# Patient Record
Sex: Female | Born: 1935 | Race: White | Hispanic: No | Marital: Single | Smoking: Never smoker
Health system: Southern US, Community
[De-identification: ages and names within clinical notes are randomized; demographics above are authoritative.]

## PROBLEM LIST (undated history)

## (undated) DIAGNOSIS — J479 Bronchiectasis, uncomplicated: Secondary | ICD-10-CM

## (undated) DIAGNOSIS — I10 Essential (primary) hypertension: Secondary | ICD-10-CM

## (undated) DIAGNOSIS — I712 Thoracic aortic aneurysm, without rupture: Secondary | ICD-10-CM

## (undated) DIAGNOSIS — R32 Unspecified urinary incontinence: Secondary | ICD-10-CM

## (undated) DIAGNOSIS — H409 Unspecified glaucoma: Secondary | ICD-10-CM

## (undated) DIAGNOSIS — I71019 Dissection of thoracic aorta, unspecified: Secondary | ICD-10-CM

## (undated) DIAGNOSIS — I219 Acute myocardial infarction, unspecified: Secondary | ICD-10-CM

## (undated) DIAGNOSIS — R4189 Other symptoms and signs involving cognitive functions and awareness: Secondary | ICD-10-CM

## (undated) HISTORY — DX: Acute myocardial infarction, unspecified: I21.9

## (undated) HISTORY — DX: Essential (primary) hypertension: I10

## (undated) HISTORY — DX: Unspecified urinary incontinence: R32

## (undated) HISTORY — DX: Other symptoms and signs involving cognitive functions and awareness: R41.89

## (undated) HISTORY — DX: Unspecified glaucoma: H40.9

## (undated) HISTORY — DX: Bronchiectasis, uncomplicated: J47.9

---

## 1992-04-30 HISTORY — PX: CATARACT EXTRACTION: SUR2

## 2004-04-30 HISTORY — PX: CATARACT EXTRACTION: SUR2

## 2014-10-22 LAB — HM DEXA SCAN

## 2015-05-10 DIAGNOSIS — J40 Bronchitis, not specified as acute or chronic: Secondary | ICD-10-CM | POA: Diagnosis not present

## 2015-05-10 DIAGNOSIS — I10 Essential (primary) hypertension: Secondary | ICD-10-CM | POA: Diagnosis not present

## 2015-06-29 DIAGNOSIS — R918 Other nonspecific abnormal finding of lung field: Secondary | ICD-10-CM | POA: Diagnosis not present

## 2015-06-29 DIAGNOSIS — J9811 Atelectasis: Secondary | ICD-10-CM | POA: Diagnosis not present

## 2015-06-29 DIAGNOSIS — R05 Cough: Secondary | ICD-10-CM | POA: Diagnosis not present

## 2015-06-29 DIAGNOSIS — J40 Bronchitis, not specified as acute or chronic: Secondary | ICD-10-CM | POA: Diagnosis not present

## 2015-06-29 DIAGNOSIS — I10 Essential (primary) hypertension: Secondary | ICD-10-CM | POA: Diagnosis not present

## 2015-06-30 DIAGNOSIS — I10 Essential (primary) hypertension: Secondary | ICD-10-CM | POA: Diagnosis not present

## 2015-06-30 DIAGNOSIS — E782 Mixed hyperlipidemia: Secondary | ICD-10-CM | POA: Diagnosis not present

## 2015-06-30 DIAGNOSIS — I2510 Atherosclerotic heart disease of native coronary artery without angina pectoris: Secondary | ICD-10-CM | POA: Diagnosis not present

## 2015-08-04 DIAGNOSIS — H401132 Primary open-angle glaucoma, bilateral, moderate stage: Secondary | ICD-10-CM | POA: Diagnosis not present

## 2015-08-04 DIAGNOSIS — H43813 Vitreous degeneration, bilateral: Secondary | ICD-10-CM | POA: Diagnosis not present

## 2015-08-24 DIAGNOSIS — R05 Cough: Secondary | ICD-10-CM | POA: Diagnosis not present

## 2015-08-24 DIAGNOSIS — J471 Bronchiectasis with (acute) exacerbation: Secondary | ICD-10-CM | POA: Diagnosis not present

## 2015-09-19 DIAGNOSIS — R05 Cough: Secondary | ICD-10-CM | POA: Diagnosis not present

## 2015-09-19 DIAGNOSIS — J479 Bronchiectasis, uncomplicated: Secondary | ICD-10-CM | POA: Diagnosis not present

## 2015-09-19 DIAGNOSIS — R918 Other nonspecific abnormal finding of lung field: Secondary | ICD-10-CM | POA: Diagnosis not present

## 2015-09-29 DIAGNOSIS — R05 Cough: Secondary | ICD-10-CM | POA: Diagnosis not present

## 2015-09-29 DIAGNOSIS — J42 Unspecified chronic bronchitis: Secondary | ICD-10-CM | POA: Diagnosis not present

## 2015-09-29 DIAGNOSIS — R1313 Dysphagia, pharyngeal phase: Secondary | ICD-10-CM | POA: Diagnosis not present

## 2015-10-05 DIAGNOSIS — J42 Unspecified chronic bronchitis: Secondary | ICD-10-CM | POA: Diagnosis not present

## 2015-10-05 DIAGNOSIS — R1310 Dysphagia, unspecified: Secondary | ICD-10-CM | POA: Diagnosis not present

## 2015-10-05 DIAGNOSIS — R1312 Dysphagia, oropharyngeal phase: Secondary | ICD-10-CM | POA: Diagnosis not present

## 2015-10-05 DIAGNOSIS — R1313 Dysphagia, pharyngeal phase: Secondary | ICD-10-CM | POA: Diagnosis not present

## 2015-10-05 DIAGNOSIS — R05 Cough: Secondary | ICD-10-CM | POA: Diagnosis not present

## 2015-10-06 DIAGNOSIS — Z0000 Encounter for general adult medical examination without abnormal findings: Secondary | ICD-10-CM | POA: Diagnosis not present

## 2015-10-06 DIAGNOSIS — R413 Other amnesia: Secondary | ICD-10-CM | POA: Diagnosis not present

## 2015-10-06 DIAGNOSIS — M25562 Pain in left knee: Secondary | ICD-10-CM | POA: Diagnosis not present

## 2015-10-06 DIAGNOSIS — I10 Essential (primary) hypertension: Secondary | ICD-10-CM | POA: Diagnosis not present

## 2015-10-07 DIAGNOSIS — R05 Cough: Secondary | ICD-10-CM | POA: Diagnosis not present

## 2015-10-07 DIAGNOSIS — R1313 Dysphagia, pharyngeal phase: Secondary | ICD-10-CM | POA: Diagnosis not present

## 2015-10-07 DIAGNOSIS — J42 Unspecified chronic bronchitis: Secondary | ICD-10-CM | POA: Diagnosis not present

## 2015-10-11 DIAGNOSIS — R05 Cough: Secondary | ICD-10-CM | POA: Diagnosis not present

## 2015-10-11 DIAGNOSIS — R1313 Dysphagia, pharyngeal phase: Secondary | ICD-10-CM | POA: Diagnosis not present

## 2015-10-11 DIAGNOSIS — J42 Unspecified chronic bronchitis: Secondary | ICD-10-CM | POA: Diagnosis not present

## 2015-10-13 DIAGNOSIS — G8929 Other chronic pain: Secondary | ICD-10-CM | POA: Diagnosis not present

## 2015-10-13 DIAGNOSIS — I10 Essential (primary) hypertension: Secondary | ICD-10-CM | POA: Diagnosis not present

## 2015-10-13 DIAGNOSIS — R072 Precordial pain: Secondary | ICD-10-CM | POA: Diagnosis not present

## 2015-10-13 DIAGNOSIS — M25562 Pain in left knee: Secondary | ICD-10-CM | POA: Diagnosis not present

## 2015-10-14 DIAGNOSIS — R1313 Dysphagia, pharyngeal phase: Secondary | ICD-10-CM | POA: Diagnosis not present

## 2015-10-14 DIAGNOSIS — R05 Cough: Secondary | ICD-10-CM | POA: Diagnosis not present

## 2015-10-14 DIAGNOSIS — J42 Unspecified chronic bronchitis: Secondary | ICD-10-CM | POA: Diagnosis not present

## 2015-10-17 DIAGNOSIS — R05 Cough: Secondary | ICD-10-CM | POA: Diagnosis not present

## 2015-10-17 DIAGNOSIS — J42 Unspecified chronic bronchitis: Secondary | ICD-10-CM | POA: Diagnosis not present

## 2015-10-17 DIAGNOSIS — R1313 Dysphagia, pharyngeal phase: Secondary | ICD-10-CM | POA: Diagnosis not present

## 2015-10-18 DIAGNOSIS — M7120 Synovial cyst of popliteal space [Baker], unspecified knee: Secondary | ICD-10-CM | POA: Diagnosis not present

## 2015-10-18 DIAGNOSIS — M25562 Pain in left knee: Secondary | ICD-10-CM | POA: Diagnosis not present

## 2015-10-18 DIAGNOSIS — G8929 Other chronic pain: Secondary | ICD-10-CM | POA: Diagnosis not present

## 2015-10-18 DIAGNOSIS — M76892 Other specified enthesopathies of left lower limb, excluding foot: Secondary | ICD-10-CM | POA: Diagnosis not present

## 2015-10-18 DIAGNOSIS — M7122 Synovial cyst of popliteal space [Baker], left knee: Secondary | ICD-10-CM | POA: Diagnosis not present

## 2015-10-19 DIAGNOSIS — R1313 Dysphagia, pharyngeal phase: Secondary | ICD-10-CM | POA: Diagnosis not present

## 2015-10-19 DIAGNOSIS — J42 Unspecified chronic bronchitis: Secondary | ICD-10-CM | POA: Diagnosis not present

## 2015-10-19 DIAGNOSIS — R05 Cough: Secondary | ICD-10-CM | POA: Diagnosis not present

## 2015-10-24 DIAGNOSIS — J42 Unspecified chronic bronchitis: Secondary | ICD-10-CM | POA: Diagnosis not present

## 2015-10-24 DIAGNOSIS — R05 Cough: Secondary | ICD-10-CM | POA: Diagnosis not present

## 2015-10-24 DIAGNOSIS — R1313 Dysphagia, pharyngeal phase: Secondary | ICD-10-CM | POA: Diagnosis not present

## 2015-10-26 DIAGNOSIS — J42 Unspecified chronic bronchitis: Secondary | ICD-10-CM | POA: Diagnosis not present

## 2015-10-26 DIAGNOSIS — R1313 Dysphagia, pharyngeal phase: Secondary | ICD-10-CM | POA: Diagnosis not present

## 2015-10-26 DIAGNOSIS — R05 Cough: Secondary | ICD-10-CM | POA: Diagnosis not present

## 2015-11-07 DIAGNOSIS — M1712 Unilateral primary osteoarthritis, left knee: Secondary | ICD-10-CM | POA: Diagnosis not present

## 2015-12-07 DIAGNOSIS — B399 Histoplasmosis, unspecified: Secondary | ICD-10-CM | POA: Diagnosis not present

## 2015-12-07 DIAGNOSIS — H43813 Vitreous degeneration, bilateral: Secondary | ICD-10-CM | POA: Diagnosis not present

## 2015-12-07 DIAGNOSIS — H401132 Primary open-angle glaucoma, bilateral, moderate stage: Secondary | ICD-10-CM | POA: Diagnosis not present

## 2015-12-07 DIAGNOSIS — H04123 Dry eye syndrome of bilateral lacrimal glands: Secondary | ICD-10-CM | POA: Diagnosis not present

## 2015-12-26 DIAGNOSIS — E782 Mixed hyperlipidemia: Secondary | ICD-10-CM | POA: Diagnosis not present

## 2015-12-26 DIAGNOSIS — R590 Localized enlarged lymph nodes: Secondary | ICD-10-CM | POA: Diagnosis not present

## 2015-12-26 DIAGNOSIS — J40 Bronchitis, not specified as acute or chronic: Secondary | ICD-10-CM | POA: Diagnosis not present

## 2015-12-26 DIAGNOSIS — I2510 Atherosclerotic heart disease of native coronary artery without angina pectoris: Secondary | ICD-10-CM | POA: Diagnosis not present

## 2015-12-26 DIAGNOSIS — I10 Essential (primary) hypertension: Secondary | ICD-10-CM | POA: Diagnosis not present

## 2015-12-26 DIAGNOSIS — R05 Cough: Secondary | ICD-10-CM | POA: Diagnosis not present

## 2015-12-26 DIAGNOSIS — J479 Bronchiectasis, uncomplicated: Secondary | ICD-10-CM | POA: Diagnosis not present

## 2016-01-03 DIAGNOSIS — N281 Cyst of kidney, acquired: Secondary | ICD-10-CM | POA: Diagnosis not present

## 2016-01-03 DIAGNOSIS — R590 Localized enlarged lymph nodes: Secondary | ICD-10-CM | POA: Diagnosis not present

## 2016-01-03 DIAGNOSIS — L723 Sebaceous cyst: Secondary | ICD-10-CM | POA: Diagnosis not present

## 2016-01-03 DIAGNOSIS — M8580 Other specified disorders of bone density and structure, unspecified site: Secondary | ICD-10-CM | POA: Diagnosis not present

## 2016-01-03 DIAGNOSIS — J479 Bronchiectasis, uncomplicated: Secondary | ICD-10-CM | POA: Diagnosis not present

## 2016-01-03 DIAGNOSIS — M5134 Other intervertebral disc degeneration, thoracic region: Secondary | ICD-10-CM | POA: Diagnosis not present

## 2016-01-03 DIAGNOSIS — E041 Nontoxic single thyroid nodule: Secondary | ICD-10-CM | POA: Diagnosis not present

## 2016-01-03 DIAGNOSIS — I517 Cardiomegaly: Secondary | ICD-10-CM | POA: Diagnosis not present

## 2016-01-03 DIAGNOSIS — N289 Disorder of kidney and ureter, unspecified: Secondary | ICD-10-CM | POA: Diagnosis not present

## 2016-01-03 DIAGNOSIS — R05 Cough: Secondary | ICD-10-CM | POA: Diagnosis not present

## 2016-01-03 DIAGNOSIS — R918 Other nonspecific abnormal finding of lung field: Secondary | ICD-10-CM | POA: Diagnosis not present

## 2016-01-03 DIAGNOSIS — I712 Thoracic aortic aneurysm, without rupture: Secondary | ICD-10-CM | POA: Diagnosis not present

## 2016-01-04 DIAGNOSIS — R05 Cough: Secondary | ICD-10-CM | POA: Diagnosis not present

## 2016-01-04 DIAGNOSIS — J479 Bronchiectasis, uncomplicated: Secondary | ICD-10-CM | POA: Diagnosis not present

## 2016-01-17 DIAGNOSIS — R413 Other amnesia: Secondary | ICD-10-CM | POA: Diagnosis not present

## 2016-01-17 DIAGNOSIS — I10 Essential (primary) hypertension: Secondary | ICD-10-CM | POA: Diagnosis not present

## 2016-01-17 DIAGNOSIS — J479 Bronchiectasis, uncomplicated: Secondary | ICD-10-CM | POA: Diagnosis not present

## 2016-02-01 DIAGNOSIS — Z7982 Long term (current) use of aspirin: Secondary | ICD-10-CM | POA: Diagnosis not present

## 2016-02-01 DIAGNOSIS — Z23 Encounter for immunization: Secondary | ICD-10-CM | POA: Diagnosis not present

## 2016-02-01 DIAGNOSIS — K5641 Fecal impaction: Secondary | ICD-10-CM | POA: Diagnosis not present

## 2016-02-01 DIAGNOSIS — I10 Essential (primary) hypertension: Secondary | ICD-10-CM | POA: Diagnosis not present

## 2016-02-01 DIAGNOSIS — K5900 Constipation, unspecified: Secondary | ICD-10-CM | POA: Diagnosis not present

## 2016-02-01 DIAGNOSIS — K6289 Other specified diseases of anus and rectum: Secondary | ICD-10-CM | POA: Diagnosis not present

## 2016-02-02 DIAGNOSIS — I10 Essential (primary) hypertension: Secondary | ICD-10-CM | POA: Diagnosis not present

## 2016-02-02 DIAGNOSIS — Z23 Encounter for immunization: Secondary | ICD-10-CM | POA: Diagnosis not present

## 2016-02-02 DIAGNOSIS — K5641 Fecal impaction: Secondary | ICD-10-CM | POA: Diagnosis not present

## 2016-02-06 DIAGNOSIS — M2012 Hallux valgus (acquired), left foot: Secondary | ICD-10-CM | POA: Diagnosis not present

## 2016-03-27 DIAGNOSIS — K5904 Chronic idiopathic constipation: Secondary | ICD-10-CM | POA: Diagnosis not present

## 2016-03-27 DIAGNOSIS — Z86010 Personal history of colonic polyps: Secondary | ICD-10-CM | POA: Diagnosis not present

## 2016-04-18 DIAGNOSIS — H43813 Vitreous degeneration, bilateral: Secondary | ICD-10-CM | POA: Diagnosis not present

## 2016-04-18 DIAGNOSIS — H401132 Primary open-angle glaucoma, bilateral, moderate stage: Secondary | ICD-10-CM | POA: Diagnosis not present

## 2016-04-18 DIAGNOSIS — H04123 Dry eye syndrome of bilateral lacrimal glands: Secondary | ICD-10-CM | POA: Diagnosis not present

## 2016-04-18 DIAGNOSIS — B399 Histoplasmosis, unspecified: Secondary | ICD-10-CM | POA: Diagnosis not present

## 2016-05-18 DIAGNOSIS — M25562 Pain in left knee: Secondary | ICD-10-CM | POA: Diagnosis not present

## 2016-05-18 DIAGNOSIS — G8929 Other chronic pain: Secondary | ICD-10-CM | POA: Diagnosis not present

## 2016-05-18 DIAGNOSIS — J479 Bronchiectasis, uncomplicated: Secondary | ICD-10-CM | POA: Diagnosis not present

## 2016-05-18 DIAGNOSIS — I10 Essential (primary) hypertension: Secondary | ICD-10-CM | POA: Diagnosis not present

## 2016-08-01 DIAGNOSIS — N390 Urinary tract infection, site not specified: Secondary | ICD-10-CM | POA: Diagnosis not present

## 2016-08-01 DIAGNOSIS — I10 Essential (primary) hypertension: Secondary | ICD-10-CM | POA: Diagnosis not present

## 2016-08-01 DIAGNOSIS — I2510 Atherosclerotic heart disease of native coronary artery without angina pectoris: Secondary | ICD-10-CM | POA: Diagnosis not present

## 2016-08-01 DIAGNOSIS — J479 Bronchiectasis, uncomplicated: Secondary | ICD-10-CM | POA: Diagnosis not present

## 2016-08-01 DIAGNOSIS — Z7982 Long term (current) use of aspirin: Secondary | ICD-10-CM | POA: Diagnosis not present

## 2016-08-01 DIAGNOSIS — J8410 Pulmonary fibrosis, unspecified: Secondary | ICD-10-CM | POA: Diagnosis not present

## 2016-08-01 DIAGNOSIS — I252 Old myocardial infarction: Secondary | ICD-10-CM | POA: Diagnosis not present

## 2016-08-01 DIAGNOSIS — R531 Weakness: Secondary | ICD-10-CM | POA: Diagnosis not present

## 2016-08-01 DIAGNOSIS — R410 Disorientation, unspecified: Secondary | ICD-10-CM | POA: Diagnosis not present

## 2016-08-16 DIAGNOSIS — R42 Dizziness and giddiness: Secondary | ICD-10-CM | POA: Diagnosis not present

## 2016-08-16 DIAGNOSIS — I252 Old myocardial infarction: Secondary | ICD-10-CM | POA: Diagnosis not present

## 2016-08-16 DIAGNOSIS — R05 Cough: Secondary | ICD-10-CM | POA: Diagnosis not present

## 2016-08-16 DIAGNOSIS — D649 Anemia, unspecified: Secondary | ICD-10-CM | POA: Diagnosis not present

## 2016-08-16 DIAGNOSIS — R918 Other nonspecific abnormal finding of lung field: Secondary | ICD-10-CM | POA: Diagnosis not present

## 2016-08-16 DIAGNOSIS — K5909 Other constipation: Secondary | ICD-10-CM | POA: Diagnosis not present

## 2016-08-16 DIAGNOSIS — L858 Other specified epidermal thickening: Secondary | ICD-10-CM | POA: Diagnosis not present

## 2016-08-16 DIAGNOSIS — J479 Bronchiectasis, uncomplicated: Secondary | ICD-10-CM | POA: Diagnosis not present

## 2016-08-16 DIAGNOSIS — I10 Essential (primary) hypertension: Secondary | ICD-10-CM | POA: Diagnosis not present

## 2016-08-16 DIAGNOSIS — R413 Other amnesia: Secondary | ICD-10-CM | POA: Diagnosis not present

## 2016-08-16 DIAGNOSIS — E785 Hyperlipidemia, unspecified: Secondary | ICD-10-CM | POA: Diagnosis not present

## 2016-08-16 DIAGNOSIS — I25118 Atherosclerotic heart disease of native coronary artery with other forms of angina pectoris: Secondary | ICD-10-CM | POA: Diagnosis not present

## 2016-08-23 DIAGNOSIS — K5909 Other constipation: Secondary | ICD-10-CM | POA: Diagnosis not present

## 2016-08-23 DIAGNOSIS — D508 Other iron deficiency anemias: Secondary | ICD-10-CM | POA: Diagnosis not present

## 2016-08-23 DIAGNOSIS — I25118 Atherosclerotic heart disease of native coronary artery with other forms of angina pectoris: Secondary | ICD-10-CM | POA: Diagnosis not present

## 2016-08-23 DIAGNOSIS — I10 Essential (primary) hypertension: Secondary | ICD-10-CM | POA: Diagnosis not present

## 2016-08-24 DIAGNOSIS — D508 Other iron deficiency anemias: Secondary | ICD-10-CM | POA: Diagnosis not present

## 2016-08-24 DIAGNOSIS — K5909 Other constipation: Secondary | ICD-10-CM | POA: Diagnosis not present

## 2016-08-27 DIAGNOSIS — R938 Abnormal findings on diagnostic imaging of other specified body structures: Secondary | ICD-10-CM | POA: Diagnosis not present

## 2016-08-27 DIAGNOSIS — R918 Other nonspecific abnormal finding of lung field: Secondary | ICD-10-CM | POA: Diagnosis not present

## 2016-08-27 DIAGNOSIS — I712 Thoracic aortic aneurysm, without rupture: Secondary | ICD-10-CM | POA: Diagnosis not present

## 2016-08-27 DIAGNOSIS — J479 Bronchiectasis, uncomplicated: Secondary | ICD-10-CM | POA: Diagnosis not present

## 2016-08-28 DIAGNOSIS — I712 Thoracic aortic aneurysm, without rupture, unspecified: Secondary | ICD-10-CM | POA: Insufficient documentation

## 2016-08-30 DIAGNOSIS — D485 Neoplasm of uncertain behavior of skin: Secondary | ICD-10-CM | POA: Diagnosis not present

## 2016-08-30 DIAGNOSIS — C4402 Squamous cell carcinoma of skin of lip: Secondary | ICD-10-CM | POA: Diagnosis not present

## 2016-09-06 DIAGNOSIS — C4402 Squamous cell carcinoma of skin of lip: Secondary | ICD-10-CM | POA: Diagnosis not present

## 2016-09-18 DIAGNOSIS — I2510 Atherosclerotic heart disease of native coronary artery without angina pectoris: Secondary | ICD-10-CM | POA: Insufficient documentation

## 2016-09-18 DIAGNOSIS — I712 Thoracic aortic aneurysm, without rupture: Secondary | ICD-10-CM | POA: Diagnosis not present

## 2016-09-18 DIAGNOSIS — I252 Old myocardial infarction: Secondary | ICD-10-CM | POA: Diagnosis not present

## 2016-09-18 DIAGNOSIS — J479 Bronchiectasis, uncomplicated: Secondary | ICD-10-CM | POA: Diagnosis not present

## 2016-09-18 DIAGNOSIS — I10 Essential (primary) hypertension: Secondary | ICD-10-CM | POA: Diagnosis not present

## 2016-09-19 DIAGNOSIS — I252 Old myocardial infarction: Secondary | ICD-10-CM | POA: Diagnosis not present

## 2016-09-19 DIAGNOSIS — I2510 Atherosclerotic heart disease of native coronary artery without angina pectoris: Secondary | ICD-10-CM | POA: Diagnosis not present

## 2016-09-19 DIAGNOSIS — I10 Essential (primary) hypertension: Secondary | ICD-10-CM | POA: Diagnosis not present

## 2016-09-19 DIAGNOSIS — G3184 Mild cognitive impairment, so stated: Secondary | ICD-10-CM | POA: Diagnosis not present

## 2016-09-22 DIAGNOSIS — R600 Localized edema: Secondary | ICD-10-CM | POA: Diagnosis not present

## 2016-09-22 DIAGNOSIS — I252 Old myocardial infarction: Secondary | ICD-10-CM | POA: Diagnosis not present

## 2016-09-22 DIAGNOSIS — R93422 Abnormal radiologic findings on diagnostic imaging of left kidney: Secondary | ICD-10-CM | POA: Diagnosis not present

## 2016-09-22 DIAGNOSIS — J479 Bronchiectasis, uncomplicated: Secondary | ICD-10-CM | POA: Diagnosis not present

## 2016-09-22 DIAGNOSIS — R0781 Pleurodynia: Secondary | ICD-10-CM | POA: Diagnosis not present

## 2016-09-22 DIAGNOSIS — Z7982 Long term (current) use of aspirin: Secondary | ICD-10-CM | POA: Diagnosis not present

## 2016-09-22 DIAGNOSIS — Z79899 Other long term (current) drug therapy: Secondary | ICD-10-CM | POA: Diagnosis not present

## 2016-09-22 DIAGNOSIS — W19XXXA Unspecified fall, initial encounter: Secondary | ICD-10-CM | POA: Diagnosis not present

## 2016-09-22 DIAGNOSIS — R918 Other nonspecific abnormal finding of lung field: Secondary | ICD-10-CM | POA: Diagnosis not present

## 2016-09-22 DIAGNOSIS — I10 Essential (primary) hypertension: Secondary | ICD-10-CM | POA: Diagnosis not present

## 2016-09-22 DIAGNOSIS — S20211A Contusion of right front wall of thorax, initial encounter: Secondary | ICD-10-CM | POA: Diagnosis not present

## 2016-09-22 DIAGNOSIS — R0789 Other chest pain: Secondary | ICD-10-CM | POA: Diagnosis not present

## 2016-10-01 DIAGNOSIS — I77810 Thoracic aortic ectasia: Secondary | ICD-10-CM | POA: Diagnosis not present

## 2016-10-01 DIAGNOSIS — I088 Other rheumatic multiple valve diseases: Secondary | ICD-10-CM | POA: Diagnosis not present

## 2016-10-03 DIAGNOSIS — I2510 Atherosclerotic heart disease of native coronary artery without angina pectoris: Secondary | ICD-10-CM | POA: Diagnosis not present

## 2016-10-03 DIAGNOSIS — R413 Other amnesia: Secondary | ICD-10-CM | POA: Diagnosis not present

## 2016-10-03 DIAGNOSIS — I10 Essential (primary) hypertension: Secondary | ICD-10-CM | POA: Diagnosis not present

## 2016-10-03 DIAGNOSIS — R05 Cough: Secondary | ICD-10-CM | POA: Diagnosis not present

## 2016-10-03 DIAGNOSIS — I252 Old myocardial infarction: Secondary | ICD-10-CM | POA: Diagnosis not present

## 2016-10-03 DIAGNOSIS — J479 Bronchiectasis, uncomplicated: Secondary | ICD-10-CM | POA: Diagnosis not present

## 2016-10-29 DIAGNOSIS — R350 Frequency of micturition: Secondary | ICD-10-CM | POA: Diagnosis not present

## 2016-11-02 DIAGNOSIS — H401132 Primary open-angle glaucoma, bilateral, moderate stage: Secondary | ICD-10-CM | POA: Diagnosis not present

## 2016-11-02 DIAGNOSIS — H5213 Myopia, bilateral: Secondary | ICD-10-CM | POA: Diagnosis not present

## 2016-11-02 DIAGNOSIS — B399 Histoplasmosis, unspecified: Secondary | ICD-10-CM | POA: Diagnosis not present

## 2016-11-06 DIAGNOSIS — I10 Essential (primary) hypertension: Secondary | ICD-10-CM | POA: Diagnosis not present

## 2016-11-06 DIAGNOSIS — I2510 Atherosclerotic heart disease of native coronary artery without angina pectoris: Secondary | ICD-10-CM | POA: Diagnosis not present

## 2016-11-06 DIAGNOSIS — D508 Other iron deficiency anemias: Secondary | ICD-10-CM | POA: Diagnosis not present

## 2016-11-06 DIAGNOSIS — I252 Old myocardial infarction: Secondary | ICD-10-CM | POA: Diagnosis not present

## 2016-11-08 DIAGNOSIS — I252 Old myocardial infarction: Secondary | ICD-10-CM | POA: Diagnosis not present

## 2016-11-08 DIAGNOSIS — J479 Bronchiectasis, uncomplicated: Secondary | ICD-10-CM | POA: Diagnosis not present

## 2016-11-08 DIAGNOSIS — R413 Other amnesia: Secondary | ICD-10-CM | POA: Diagnosis not present

## 2016-11-08 DIAGNOSIS — I10 Essential (primary) hypertension: Secondary | ICD-10-CM | POA: Diagnosis not present

## 2016-11-08 DIAGNOSIS — R05 Cough: Secondary | ICD-10-CM | POA: Diagnosis not present

## 2016-11-08 DIAGNOSIS — R918 Other nonspecific abnormal finding of lung field: Secondary | ICD-10-CM | POA: Insufficient documentation

## 2016-11-08 DIAGNOSIS — I2510 Atherosclerotic heart disease of native coronary artery without angina pectoris: Secondary | ICD-10-CM | POA: Diagnosis not present

## 2016-12-26 ENCOUNTER — Encounter: Payer: Self-pay | Admitting: Internal Medicine

## 2016-12-26 ENCOUNTER — Non-Acute Institutional Stay: Payer: Medicare Other | Admitting: Internal Medicine

## 2016-12-26 VITALS — BP 116/84 | HR 75 | Temp 97.9°F | Ht 67.5 in | Wt 140.0 lb

## 2016-12-26 DIAGNOSIS — R32 Unspecified urinary incontinence: Secondary | ICD-10-CM | POA: Diagnosis not present

## 2016-12-26 DIAGNOSIS — F09 Unspecified mental disorder due to known physiological condition: Secondary | ICD-10-CM

## 2016-12-26 DIAGNOSIS — I10 Essential (primary) hypertension: Secondary | ICD-10-CM | POA: Diagnosis not present

## 2016-12-26 DIAGNOSIS — H409 Unspecified glaucoma: Secondary | ICD-10-CM | POA: Diagnosis not present

## 2016-12-26 DIAGNOSIS — J479 Bronchiectasis, uncomplicated: Secondary | ICD-10-CM

## 2016-12-26 DIAGNOSIS — I712 Thoracic aortic aneurysm, without rupture, unspecified: Secondary | ICD-10-CM

## 2016-12-26 DIAGNOSIS — I252 Old myocardial infarction: Secondary | ICD-10-CM

## 2016-12-26 DIAGNOSIS — I2510 Atherosclerotic heart disease of native coronary artery without angina pectoris: Secondary | ICD-10-CM | POA: Diagnosis not present

## 2016-12-26 DIAGNOSIS — K5909 Other constipation: Secondary | ICD-10-CM | POA: Diagnosis not present

## 2016-12-26 MED ORDER — VITAMIN D3 125 MCG (5000 UT) PO CAPS: 1.0000 | ORAL_CAPSULE | Freq: Every day | ORAL | 5 refills | Status: DC

## 2016-12-26 NOTE — Progress Notes (Signed)
Provider:  Rexene Edison. Kaitlyn Ortiz, D.O., C.M.D. Location:  Occupational psychologist of Service:  Clinic (12)  Previous PCP: Kaitlyn Curry, DO Patient Care Team: Kaitlyn Curry, DO as PCP - General (Geriatric Medicine)  Extended Emergency Contact Information Primary Emergency Contact: [facility] Address: [address]          [place], [place] [address] [place] Ort[place] of [place] Phone: [phone] Work Phone: [phone] Mobile Phone: [phone] Relation: Daughter  Code Status: DNR Goals of Care: Advanced Directive information No flowsheet data found.  Chief Complaint  Patient presents with  . Establish Care    New Patient moved to [place] one [place] ago. Here with daughter Kaitlyn Ortiz.    HPI: Patient is a 83 y.o. female seen today to establish with [place] [facility].  Records have been requested from Dr. Maryjean Ortiz in [place] am actually able to see them in careeverywhere.   She moved here at the end of July.  She'd been in [place] for 4 mos after 18 years in [place].  She was sleepwalking when she went on a tour trip fo 5 days in [place].  Tour guide was concerned about her cognitive status.  She was taken to the hospital.  She had a UTI.  She wound up moving in with her daughter for the 4 mos.  She was upset with Kaitlyn Ortiz, her daughter.  She felt abducted.  She did have thoughts about her memory not being so good.  She'd had tests done for 3 hrs and she reports she did very well on the test.  She had two more tests after the brief tests.  She's been told she has mild cognitive impairment.  Has difficulty managing her medications and paying bills.  She now lives in a [place] apt.  Dr. Quay Burow recommended she see a neurologist.   She notes difficulty with process not memory.  She has difficulty finding her table.  She denies confusion with the menu.  Also gets lost coming back from the bistro.  Has difficulty with faces and names.   She has a h/o getting lost in [place].  She could not find the kitchen sink when getting up from the table.   Does not drive due to glaucoma.  She has a h/o htn, bronchiectasis, glaucoma, mild cognitive impairment, prior heart attack in 2011--stent could not be placed b/c of small caliber of artery, managed with medication and cardiac rehab, incontinence.    Thoracic aortic aneurysm 4.2cm on CT scan in April of this year.  Incontinence:  Has started about 6 mos ago.  It's constant all day and all night.  She is leaking continuously. Has not been evaluated fully.    Constipation:  Was seeing a Dr. Gwenlyn Found for it.  Has to be meticulous about her bowel regimen.  Uses prunes, beet root powder, bananas.  Things only work short term.  She reports she's been impacted twice.  Hard and infrequent bowel movements.  Reports she starts to worry after 5 days.    Thoracic aortic aneurysm of 4.2cm.  BP well controlled today.    Glaucoma:  Says she sometimes does unusual things that she is unsure are from this or her cognitive losses. There was a delay in diagnosis, but at least 5-6 years ago. Takes drops religiously.  She's had to give up most reading except large print, stopped driving just over a year ago.  Just had an eye appt recently in [place]--[facility]--Dr Loletha Grayer.  She had a growth on her upper lip that was removed by dermatology. It was precancerous and she had to have another surgery.  Says it's coming back slightly but it's softer than before.    She's had a few falls.  4-5.  No injuries.  Once tripped over stairs here.  Rest before moving into [place].    Bronchiectasis:  Has had a series of pneumonias over decades.  She's done her best to avoid infection. She's had a chronic cough since she avoided a URI.  It was going on 3 years.  She saw Dr. Parke Simmers.  She got two items from him--incentive spirometer, acapella for congestion.  She was also given a course of antibiotics.  Cough is better.     Past Medical History:  Diagnosis Date  . Bronchiectasis ([place])   . Cognitive changes    mild  . Glaucoma   . Heart attack ([place])    mild  . Hypertension   . Incontinence    Past Surgical History:  Procedure Laterality Date  . CATARACT EXTRACTION  1994  . CATARACT EXTRACTION  2006    Social History   Social History  . Marital status: Single    Spouse name: N/A  . Number of children: N/A  . Years of education: N/A   Social History Main Topics  . Smoking status: Never Smoker  . Smokeless tobacco: Never Used  . Alcohol use No  . Drug use: No  . Sexual activity: Not Currently   Other Topics Concern  . None   Social History Narrative   Social History      Diet? Lots of fruits and veggies      Do you drink/eat things with caffeine? no      Marital status?                            single        What year were you married? 1960      Do you live in a house, apartment, assisted living, condo, trailer, etc.? apartment      Is it one or more stories? one      How many persons live in your home? one      Do you have any pets in your home? (please list) no      Highest level of education completed? masters      Current or past profession: publishing      Advanced Directives      Do you exercise?           yes                           Type & how often? Walking- daily      Do you have a living will? yes      Do you have a DNR form?                                  If not, do you want to discuss one? no      Do you have signed POA/HPOA for forms? yes      Functional Status      Do you have difficulty bathing or dressing yourself? no      Do you have difficulty preparing food or eating?  no  Do you have difficulty managing your medications? no      Do you have difficulty managing your finances? no      Do you have difficulty affording your medications? no       reports that she has never smoked. She has never used smokeless tobacco. She reports  that she does not drink alcohol or use drugs.  Functional Status Survey:    Family History  Problem Relation Age of Onset  . Stroke Father     Health Maintenance  Topic Date Due  . TETANUS/TDAP  05/19/1950  . DEXA SCAN  05/19/1998  . PNA vac Low Risk Adult (1 of 2 - PCV13) 05/19/1998  . INFLUENZA VACCINE  11/28/2016    No Known Allergies  Allergies as of 12/26/2016   No Known Allergies     Medication List       Accurate as of 12/26/16 11:59 PM. Always use your most recent med list.          aspirin 81 MG chewable tablet Chew 1 tablet by mouth daily.   GLUCOSAMINE 1500 COMPLEX PO Take 1-2 tablets by mouth daily.   Glucosamine HCl 1500 MG Tabs Take 2 tablets by mouth daily.   isosorbide mononitrate 60 MG 24 hr tablet Commonly known as:  IMDUR Take 1 tablet by mouth daily.   latanoprost 0.005 % ophthalmic solution Commonly known as:  XALATAN Place 1 drop into the left eye at bedtime.   Lutein 6 MG Caps Take 1 capsule by mouth daily.   metoprolol succinate 25 MG 24 hr tablet Commonly known as:  TOPROL-XL Take 1 tablet by mouth daily.   multivitamin tablet Take 1 tablet by mouth daily.   THERA Tabs Take 1 tablet by mouth daily.   NORVASC 5 MG tablet Generic drug:  amLODipine Take 5 mg by mouth daily.   amLODipine 2.5 MG tablet Commonly known as:  NORVASC Take 2.5 mg by mouth daily. Take with 5 mg to equal 7.5 mg   polyethylene glycol packet Commonly known as:  MIRALAX / GLYCOLAX Take 17 g by mouth daily as needed.   pravastatin 40 MG tablet Commonly known as:  PRAVACHOL Take 40 mg by mouth daily.   vitamin C with rose hips 1000 MG tablet Take 1 tablet by mouth daily.   Vitamin D3 5000 units Caps Take 1 capsule (5,000 Units total) by mouth daily.   vitamin E 400 UNIT capsule Take 1 capsule by mouth daily.   zinc gluconate 50 MG tablet Take 1 tablet by mouth daily as needed for congestion.            Discharge Care Instructions          Start     Ordered   12/26/16 0000  Cholecalciferol (VITAMIN D3) 5000 units CAPS  Daily     12/26/16 1539      Review of Systems  Constitutional: Negative for chills and fever.  HENT: Positive for hearing loss.        Full feeling; occasional drooling  Eyes: Positive for blurred vision.       Glaucoma  Respiratory: Negative for cough and shortness of breath.   Cardiovascular: Negative for chest pain, palpitations and leg swelling.       H/o MI, HTN  Gastrointestinal: Positive for constipation.  Genitourinary: Positive for frequency and urgency. Negative for dysuria and hematuria.       Incontinence  Musculoskeletal: Negative for falls.  Skin:  Skin cancers, dry skin  Neurological: Positive for tremors and weakness.       Balance problems  Endo/Heme/Allergies: Bruises/bleeds easily.  Psychiatric/Behavioral: Positive for memory loss. The patient is nervous/anxious.     Vitals:   12/26/16 1435  BP: 116/84  Pulse: 75  Temp: 97.9 F (36.6 C)  TempSrc: Oral  SpO2: 93%  Weight: 140 lb (63.5 kg)  Height: 5' 7.5" (1.715 m)   Body mass index is 21.6 kg/m. Physical Exam  Constitutional: She appears well-developed. No distress.  HENT:  Head: Normocephalic and atraumatic.  Right Ear: External ear normal.  Left Ear: External ear normal.  Nose: Nose normal.  Mouth/Throat: Oropharynx is clear and moist. No oropharyngeal exudate.  HOH, hearing aids  Eyes: Pupils are equal, round, and reactive to light. Conjunctivae and EOM are normal.  Glasses, poor vision (does not make good eye contact b/c of visual loss)  Neck: Normal range of motion. Neck supple. No JVD present.  Cardiovascular: Normal rate, regular rhythm, normal heart sounds and intact distal pulses.   Pulmonary/Chest: Effort normal and breath sounds normal. No respiratory distress. She has no rales.  Abdominal: Soft. Bowel sounds are normal. She exhibits no distension. There is no tenderness.  Musculoskeletal:  Normal range of motion.  Lymphadenopathy:    She has no cervical adenopathy.  Neurological: She is alert.  Tremor; some short term memory loss, daughter helps with history (pt gets sequence, time frame mixed up)  Skin: Skin is warm and dry. Capillary refill takes less than 2 seconds. There is pallor.  Psychiatric: She has a normal mood and affect.  Slightly masked facies    Labs reviewed: Basic Metabolic Panel:  Recent Labs  01/01/17 0700  NA 141  K 3.9  BUN 24*  CREATININE 0.8   Liver Function Tests:  Recent Labs  01/01/17 0700  AST 20  ALT 16  ALKPHOS 62   No results for input(s): LIPASE, AMYLASE in the last 8760 hours. No results for input(s): AMMONIA in the last 8760 hours. CBC:  Recent Labs  01/01/17 0700  WBC 5.8  HGB 12.6  HCT 39  PLT 178   Cardiac Enzymes: No results for input(s): CKTOTAL, CKMB, CKMBINDEX, TROPONINI in the last 8760 hours. BNP: Invalid input(s): POCBNP No results found for: HGBA1C Lab Results  Component Value Date   TSH 2.89 01/01/2017   Lab Results  Component Value Date   VITAMINB12 550 01/01/2017   No results found for: FOLATE No results found for: IRON, TIBC, FERRITIN  Imaging and Procedures noted on new patient packet: Bone density 10/22/14 with osteopenia  Assessment/Plan 1. Essential hypertension -bp at goal with amlodipine 7.5mg  daily, imdur, toprol xl--cont same  2. Bronchiectasis without complication ([place]) -follows with pulmonary -prone to frequent infections -ensure all vaccines are up to date--need to look in careeverywhere or get directly from Dr. Quay Burow' office if not visible -definitely needs annual flu shot  3. Glaucoma, unspecified glaucoma type, unspecified laterality -cont ophtho f/u -cont lutein, latanoprost drops -vision poor and lives alone in [place]  4. Urinary incontinence, unspecified type -ongoing, cont pads and depends if needed -r/o UTI with UA c+s  5. Coronary artery disease with history of  myocardial infarction without history of CABG -cont secondary prevention with bp control, baby asa, beta blocker, statin  6. Thoracic aortic aneurysm without rupture ([place]) -monitor annually though with discussion pt and daughter do not think she should have surgery for this and I'm inclined to agree (she is now  worrying about it though)  7. Chronic constipation -cont miralax daily as needed -encouraged hydration during the day with water, increased physical activity and fiber intake  8. Mild cognitive disorder -has h/o wandering when she was traveling, short term memory loss, her daughter helps with pills and bills -I'm surprised she moved to [place] -seems she'd already meet AL criteria  Labs/tests ordered:  Cbc, cmp, flp, tsh, b12/folate, vitamin D 01/23/2017 f/u on labs, memory--needs MMSE  Brooksie Ellwanger L. Deshonna Trnka, D.O. [facility] [address], [place] [address] Cell Phone (Mon-Fri 8am-5pm):  [phone] On Call:  [phone] & follow prompts after 5pm & weekends Office Phone:  [phone] Office Fax:  [phone]

## 2017-01-01 ENCOUNTER — Encounter: Payer: Self-pay | Admitting: Internal Medicine

## 2017-01-01 DIAGNOSIS — I2510 Atherosclerotic heart disease of native coronary artery without angina pectoris: Secondary | ICD-10-CM | POA: Diagnosis not present

## 2017-01-01 DIAGNOSIS — K5909 Other constipation: Secondary | ICD-10-CM | POA: Diagnosis not present

## 2017-01-01 DIAGNOSIS — I10 Essential (primary) hypertension: Secondary | ICD-10-CM | POA: Diagnosis not present

## 2017-01-01 DIAGNOSIS — E559 Vitamin D deficiency, unspecified: Secondary | ICD-10-CM | POA: Diagnosis not present

## 2017-01-01 DIAGNOSIS — R319 Hematuria, unspecified: Secondary | ICD-10-CM | POA: Diagnosis not present

## 2017-01-01 DIAGNOSIS — E039 Hypothyroidism, unspecified: Secondary | ICD-10-CM | POA: Diagnosis not present

## 2017-01-01 DIAGNOSIS — H409 Unspecified glaucoma: Secondary | ICD-10-CM | POA: Diagnosis not present

## 2017-01-01 DIAGNOSIS — F09 Unspecified mental disorder due to known physiological condition: Secondary | ICD-10-CM | POA: Diagnosis not present

## 2017-01-01 DIAGNOSIS — D519 Vitamin B12 deficiency anemia, unspecified: Secondary | ICD-10-CM | POA: Diagnosis not present

## 2017-01-01 DIAGNOSIS — D649 Anemia, unspecified: Secondary | ICD-10-CM | POA: Diagnosis not present

## 2017-01-01 DIAGNOSIS — R300 Dysuria: Secondary | ICD-10-CM | POA: Diagnosis not present

## 2017-01-01 DIAGNOSIS — N390 Urinary tract infection, site not specified: Secondary | ICD-10-CM | POA: Diagnosis not present

## 2017-01-01 LAB — CBC AND DIFFERENTIAL
HCT: 39 (ref 36–46)
Hemoglobin: 12.6 (ref 12.0–16.0)
Platelets: 178 (ref 150–399)
WBC: 5.8

## 2017-01-01 LAB — TSH: TSH: 2.89 (ref 0.41–5.90)

## 2017-01-01 LAB — HEPATIC FUNCTION PANEL
ALT: 16 (ref 7–35)
AST: 20 (ref 13–35)
Alkaline Phosphatase: 62 (ref 25–125)
Bilirubin, Total: 0.4

## 2017-01-01 LAB — BASIC METABOLIC PANEL
BUN: 24 — AB (ref 4–21)
Creatinine: 0.8 (ref 0.5–1.1)
Glucose: 95
Potassium: 3.9 (ref 3.4–5.3)
Sodium: 141 (ref 137–147)

## 2017-01-01 LAB — VITAMIN B12: Vitamin B-12: 550

## 2017-01-01 LAB — VITAMIN D 25 HYDROXY (VIT D DEFICIENCY, FRACTURES): Vit D, 25-Hydroxy: 60

## 2017-01-02 ENCOUNTER — Encounter: Payer: Self-pay | Admitting: Internal Medicine

## 2017-01-03 ENCOUNTER — Encounter: Payer: Self-pay | Admitting: Internal Medicine

## 2017-01-11 ENCOUNTER — Encounter: Payer: Self-pay | Admitting: Internal Medicine

## 2017-01-23 ENCOUNTER — Non-Acute Institutional Stay: Payer: Medicare Other | Admitting: Internal Medicine

## 2017-01-23 ENCOUNTER — Encounter: Payer: Self-pay | Admitting: Internal Medicine

## 2017-01-23 VITALS — BP 140/80 | HR 73 | Temp 97.6°F | Wt 142.0 lb

## 2017-01-23 DIAGNOSIS — R413 Other amnesia: Secondary | ICD-10-CM

## 2017-01-23 DIAGNOSIS — R32 Unspecified urinary incontinence: Secondary | ICD-10-CM | POA: Diagnosis not present

## 2017-01-23 DIAGNOSIS — I712 Thoracic aortic aneurysm, without rupture, unspecified: Secondary | ICD-10-CM

## 2017-01-23 DIAGNOSIS — R2681 Unsteadiness on feet: Secondary | ICD-10-CM | POA: Diagnosis not present

## 2017-01-23 DIAGNOSIS — I252 Old myocardial infarction: Secondary | ICD-10-CM | POA: Diagnosis not present

## 2017-01-23 DIAGNOSIS — Z23 Encounter for immunization: Secondary | ICD-10-CM

## 2017-01-23 DIAGNOSIS — J479 Bronchiectasis, uncomplicated: Secondary | ICD-10-CM

## 2017-01-23 DIAGNOSIS — I2510 Atherosclerotic heart disease of native coronary artery without angina pectoris: Secondary | ICD-10-CM | POA: Diagnosis not present

## 2017-01-23 MED ORDER — AMLODIPINE BESYLATE 5 MG PO TABS: 5.0000 mg | ORAL_TABLET | Freq: Every day | ORAL | 1 refills | Status: DC

## 2017-01-23 MED ORDER — METOPROLOL SUCCINATE ER 25 MG PO TB24: 25.0000 mg | ORAL_TABLET | Freq: Every day | ORAL | 1 refills | Status: DC

## 2017-01-23 MED ORDER — ZOSTER VAC RECOMB ADJUVANTED 50 MCG/0.5ML IM SUSR: 0.5000 mL | Freq: Once | INTRAMUSCULAR | 1 refills | Status: AC

## 2017-01-23 MED ORDER — PRAVASTATIN SODIUM 40 MG PO TABS: 40.0000 mg | ORAL_TABLET | Freq: Every day | ORAL | 1 refills | Status: DC

## 2017-01-23 MED ORDER — ISOSORBIDE MONONITRATE ER 60 MG PO TB24: 60.0000 mg | ORAL_TABLET | Freq: Every day | ORAL | 1 refills | Status: DC

## 2017-01-23 MED ORDER — AMLODIPINE BESYLATE 2.5 MG PO TABS: 2.5000 mg | ORAL_TABLET | Freq: Every day | ORAL | 1 refills | Status: DC

## 2017-01-23 NOTE — Progress Notes (Signed)
Location:  Occupational psychologist of Service:  Clinic (12)  Provider: Rafel Garde L. [name], D.O., C.M.D.  Code Status: DNR Goals of Care:  Advanced Directives 01/23/2017  Does Patient Have a Medical Advance Directive? Yes  Type of Advance Directive [place] in Chart? Yes  copies made and brought to office for scanning   Chief Complaint  Patient presents with  . Medical Management of Chronic Issues    4week follow-up    HPI: Patient is a 83 y.o. female seen today for medical management of chronic diseases.    She wants a magic pill to stop her leaking so she can go swimming.    She wants to know my plan to keep her healthy.    Says she's had her pneumonia vaccines.  Last was 3-4 years ago in [place]. She had the old shingles shot zostavax.  Has not had shingrix x 2.  Gets the flu shot annually.    Having upper abdominal pain that moves around.  Happens when she's scrunched down in her chair  Feels better if she sits up or walks around.  Not severe.  No changes with meals.    Bronchiectasis remains under control.  Aneurysm--need to keep an eye on it.  She worries about it.  4.5cm in April of '18, needs recheck April '19 after 4/30.  Her daughter questions about NPH.  Pt has memory loss, urinary incontinence, and unsteady gait.    She constantly has to monitor her bladder control.  Changes her pad every 6 hrs.  Works the best.  Has continuous leakage.  Feels a constant pressure that she cannot distinguish from an urge to urinate.    She is getting lost going to the dining hall.  She's had help from other people.  Has difficulty seeing also like at [employer] looking at labels.  Says she has processing difficulty more than a memory loss problem.    Past Medical History:  Diagnosis Date  . Bronchiectasis ([place])   . Cognitive changes    mild  . Glaucoma   . Heart attack ([place])    mild  .  Hypertension   . Incontinence     Past Surgical History:  Procedure Laterality Date  . CATARACT EXTRACTION  1994  . CATARACT EXTRACTION  2006    No Known Allergies  Outpatient Encounter Prescriptions as of 01/23/2017  Medication Sig  . amLODipine (NORVASC) 2.5 MG tablet Take 1 tablet (2.5 mg total) by mouth daily. Take with 5 mg to equal 7.5 mg  . amLODipine (NORVASC) 5 MG tablet Take 1 tablet (5 mg total) by mouth daily.  Marland Kitchen aspirin 81 MG chewable tablet Chew 1 tablet by mouth daily.  . Cholecalciferol (VITAMIN D3) 5000 units CAPS Take 1 capsule (5,000 Units total) by mouth daily.  . Glucosamine-Chondroit-Vit C-Mn (GLUCOSAMINE 1500 COMPLEX PO) Take 1-2 tablets by mouth daily.  . isosorbide mononitrate (IMDUR) 60 MG 24 hr tablet Take 1 tablet (60 mg total) by mouth daily.  Marland Kitchen latanoprost (XALATAN) 0.005 % ophthalmic solution Place 1 drop into the left eye at bedtime.  . Lutein 6 MG CAPS Take 1 capsule by mouth daily.  . metoprolol succinate (TOPROL-XL) 25 MG 24 hr tablet Take 1 tablet (25 mg total) by mouth daily.  . Multiple Vitamin (MULTIVITAMIN) tablet Take 1 tablet by mouth daily.  . polyethylene glycol (MIRALAX / GLYCOLAX) packet Take 17 g by mouth  daily as needed.  . pravastatin (PRAVACHOL) 40 MG tablet Take 1 tablet (40 mg total) by mouth daily.  . [DISCONTINUED] amLODipine (NORVASC) 2.5 MG tablet Take 2.5 mg by mouth daily. Take with 5 mg to equal 7.5 mg  . [DISCONTINUED] amLODipine (NORVASC) 5 MG tablet Take 5 mg by mouth daily.  . [DISCONTINUED] Glucosamine HCl 1500 MG TABS Take 2 tablets by mouth daily.  . [DISCONTINUED] isosorbide mononitrate (IMDUR) 60 MG 24 hr tablet Take 1 tablet by mouth daily.  . [DISCONTINUED] metoprolol succinate (TOPROL-XL) 25 MG 24 hr tablet Take 1 tablet by mouth daily.  . [DISCONTINUED] Multiple Vitamin (THERA) TABS Take 1 tablet by mouth daily.  . [DISCONTINUED] pravastatin (PRAVACHOL) 40 MG tablet Take 40 mg by mouth daily.  . [EXPIRED] Zoster  Vac Recomb Adjuvanted (SHINGRIX) injection Inject 0.5 mLs into the muscle once.   No facility-administered encounter medications on file as of 01/23/2017.     Review of Systems:  Review of Systems  Constitutional: Negative for chills, fever and malaise/fatigue.  HENT: Negative for congestion.   Eyes: Positive for blurred vision.  Respiratory: Negative for cough and shortness of breath.   Cardiovascular: Negative for chest pain and palpitations.  Gastrointestinal: Negative for abdominal pain.  Genitourinary:       Continuous urinary leakage  Musculoskeletal: Negative for falls.  Neurological: Negative for weakness.       Unsteady gait  Psychiatric/Behavioral: Positive for memory loss.    Health Maintenance  Topic Date Due  . TETANUS/TDAP  05[birth year]19/1950  . PNA vac Low Risk Adult (1 of 2 - PCV13) 05/19/1998  . INFLUENZA VACCINE  11/28/2016  . DEXA SCAN  Completed    Physical Exam: Vitals:   01/23/17 1526  BP: 140/80  Pulse: 73  Temp: 97.6 F (36.4 C)  TempSrc: Oral  SpO2: 97%  Weight: 142 lb (64.4 kg)   Body mass index is 21.91 kg/m. Physical Exam  Constitutional: She is oriented to person, place, and time.  Thin female  HENT:  Head: Normocephalic and atraumatic.  Eyes:  Glasses, poor eye contact with severe visual loss  Cardiovascular: Normal rate, regular rhythm, normal heart sounds and intact distal pulses.   Pulmonary/Chest: Effort normal.  A few scattered rhonchi  Abdominal: Bowel sounds are normal.  Musculoskeletal: Normal range of motion. She exhibits edema.  Neurological: She is alert and oriented to person, place, and time.  But easily lost due to memory and sight issues  Skin: Skin is warm and dry.  Psychiatric: She has a normal mood and affect.  Pleasant, clearly a worrier    Labs reviewed: Basic Metabolic Panel:  Recent Labs  01/01/17 0700  NA 141  K 3.9  BUN 24*  CREATININE 0.8  TSH 2.89   Liver Function Tests:  Recent Labs   01/01/17 0700  AST 20  ALT 16  ALKPHOS 62   No results for input(s): LIPASE, AMYLASE in the last 8760 hours. No results for input(s): AMMONIA in the last 8760 hours. CBC:  Recent Labs  01/01/17 0700  WBC 5.8  HGB 12.6  HCT 39  PLT 178   Assessment/Plan 1. Memory loss, short term -progressive, but mostly problematic in the last year and made worse by her poor vision--trying to get her doctors all switched over to those that come to [place] or close-by - CT Head Wo Contrast; Future to r/o NPH -if negative, work on increased support at home--daughter does pillbox  2. Need for shingles vaccine -Rx sent  to pharmacy to get this series - Zoster Vac Recomb Adjuvanted [facility]) injection; Inject 0.5 mLs into the muscle once.  Dispense: 0.5 mL; Refill: 1  3. Urinary incontinence, unspecified type -ongoing, continuous leakage, r/o NPH, but if negative, refer to urology for full workup - CT Head Wo Contrast; Future  4. Unsteady gait -r/o NPH, avoid wearing sandals or other nonsupportive footwear; consider PT, OT eval next time - CT Head Wo Contrast; Future  5. Thoracic aortic aneurysm without rupture ([place]) -f/u CT chest next year as above  6. Bronchiectasis without complication ([place]) -stable, no changes needed  Labs/tests ordered:  CT brain, if negative, urology referral to evaluate Next appt:  02/06/2017   Voshon Petro L. Zacory Fiola, D.O. [facility] [address], [place] [address] Cell Phone (Mon-Fri 8am-5pm):  [phone] On Call:  [phone] & follow prompts after 5pm & weekends Office Phone:  [phone] Office Fax:  [phone]

## 2017-01-23 NOTE — Patient Instructions (Signed)
Our office will call you with an appointment for your CT scan of your brain.    Your CT of your chest to watch for the aneurysm will be due after April 30th of next year (2019).  If your CT scan does not explain your urinary incontinence, we will send you to [facility] for evaluation.

## 2017-01-29 ENCOUNTER — Ambulatory Visit
Admission: RE | Admit: 2017-01-29 | Discharge: 2017-01-29 | Disposition: A | Discharge status: Home / Self Care | Payer: Medicare Other | Source: Ambulatory Visit | Attending: Internal Medicine | Admitting: Internal Medicine

## 2017-01-29 DIAGNOSIS — R413 Other amnesia: Secondary | ICD-10-CM | POA: Diagnosis not present

## 2017-01-29 DIAGNOSIS — R32 Unspecified urinary incontinence: Secondary | ICD-10-CM

## 2017-01-29 DIAGNOSIS — R2681 Unsteadiness on feet: Secondary | ICD-10-CM

## 2017-02-06 ENCOUNTER — Encounter: Payer: Self-pay | Admitting: Internal Medicine

## 2017-02-06 ENCOUNTER — Non-Acute Institutional Stay: Payer: Medicare Other | Admitting: Internal Medicine

## 2017-02-06 VITALS — BP 128/70 | HR 75 | Temp 98.0°F | Wt 141.0 lb

## 2017-02-06 DIAGNOSIS — F0390 Unspecified dementia without behavioral disturbance: Secondary | ICD-10-CM | POA: Diagnosis not present

## 2017-02-06 DIAGNOSIS — I252 Old myocardial infarction: Secondary | ICD-10-CM

## 2017-02-06 DIAGNOSIS — R32 Unspecified urinary incontinence: Secondary | ICD-10-CM | POA: Diagnosis not present

## 2017-02-06 DIAGNOSIS — H409 Unspecified glaucoma: Secondary | ICD-10-CM

## 2017-02-06 DIAGNOSIS — I2510 Atherosclerotic heart disease of native coronary artery without angina pectoris: Secondary | ICD-10-CM | POA: Diagnosis not present

## 2017-02-06 MED ORDER — DONEPEZIL HCL 5 MG PO TABS: 5.0000 mg | ORAL_TABLET | Freq: Every day | ORAL | 0 refills | Status: DC

## 2017-02-06 NOTE — Progress Notes (Signed)
Location:  Occupational psychologist of Service:  Clinic (12)  Provider: Brandin Dilday L. [name], D.O., C.M.D.  Code Status: DNR Goals of Care:  Advanced Directives 01/23/2017  Does Patient Have a Medical Advance Directive? Yes  Type of Advance Directive [place] in Chart? Yes   Chief Complaint  Patient presents with  . Medical Management of Chronic Issues    2week follow-up    HPI: Patient is a 83 y.o. female seen today for medical management of chronic diseases.    Reviewed CT brain.  Shows atrophy and chronic ischemic changes.  Discussed mix of AD and vascular dementia, definition of dementia meaning memory or other loss like processing as she has, plus functional loss.  Not necessarily forgetting, but does not know what to do with information, she says.  Vision quite poor from glaucoma.  Advised at previous visit to establish with Dr. Ellie Lunch here.    She had speech therapy for chronic cough before.  She thought her problem was with swallowing, but she did not have this problem.   Incontinence is ongoing and she is interested in seeing urology for a full workup.  Seems to be persistent leakage at this point.      She's been fearful about calling about ordering her meal or using the nustep machine.  She was going to set up a dinner of 4 people.  She got the one woman's number, but couldn't dial the number.  She'd write it down, then couldn't dial the number.  She also cannot figure out how to turn on the washing machine.  Her daughter was going to start coming to do the laundry.  We discussed [place].  Past Medical History:  Diagnosis Date  . Bronchiectasis ([place])   . Cognitive changes    mild  . Glaucoma   . Heart attack ([place])    mild  . Hypertension   . Incontinence     Past Surgical History:  Procedure Laterality Date  . CATARACT EXTRACTION  1994  . CATARACT EXTRACTION  2006    No  Known Allergies  Outpatient Encounter Prescriptions as of 02/06/2017  Medication Sig  . amLODipine (NORVASC) 2.5 MG tablet Take 1 tablet (2.5 mg total) by mouth daily. Take with 5 mg to equal 7.5 mg  . amLODipine (NORVASC) 5 MG tablet Take 1 tablet (5 mg total) by mouth daily.  Marland Kitchen aspirin 81 MG chewable tablet Chew 1 tablet by mouth daily.  . Cholecalciferol (VITAMIN D3) 5000 units CAPS Take 1 capsule (5,000 Units total) by mouth daily.  . Glucosamine-Chondroit-Vit C-Mn (GLUCOSAMINE 1500 COMPLEX PO) Take 1-2 tablets by mouth daily.  . isosorbide mononitrate (IMDUR) 60 MG 24 hr tablet Take 1 tablet (60 mg total) by mouth daily.  Marland Kitchen latanoprost (XALATAN) 0.005 % ophthalmic solution Place 1 drop into the left eye at bedtime.  . Lutein 6 MG CAPS Take 1 capsule by mouth daily.  . metoprolol succinate (TOPROL-XL) 25 MG 24 hr tablet Take 1 tablet (25 mg total) by mouth daily.  . Multiple Vitamin (MULTIVITAMIN) tablet Take 1 tablet by mouth daily.  . polyethylene glycol (MIRALAX / GLYCOLAX) packet Take 17 g by mouth daily as needed.  . pravastatin (PRAVACHOL) 40 MG tablet Take 1 tablet (40 mg total) by mouth daily.   No facility-administered encounter medications on file as of 02/06/2017.     Review of Systems:  Review of Systems  Constitutional: Negative for chills and fever.  HENT: Negative for congestion.   Eyes: Positive for blurred vision.  Respiratory: Negative for cough and shortness of breath.   Cardiovascular: Negative for chest pain, palpitations and leg swelling.  Gastrointestinal: Positive for constipation. Negative for abdominal pain, blood in stool, diarrhea and melena.  Genitourinary: Negative for dysuria.       Continued leakage  Musculoskeletal: Negative for falls and joint pain.       Unsteady gait  Skin: Negative for itching and rash.  Neurological: Negative for dizziness, loss of consciousness and weakness.  Psychiatric/Behavioral: Positive for memory loss.    Health  Maintenance  Topic Date Due  . TETANUS/TDAP  05/19/1952  . PNA vac Low Risk Adult (1 of 2 - PCV13) 05/19/1998  . INFLUENZA VACCINE  11/28/2016  . DEXA SCAN  Completed    Physical Exam: Vitals:   02/06/17 1437  BP: 128/70  Pulse: 75  Temp: 98 F (36.7 C)  TempSrc: Oral  SpO2: 96%  Weight: 141 lb (64 kg)   Body mass index is 21.76 kg/m. Physical Exam  Constitutional: She is oriented to person, place, and time. She appears well-developed and well-nourished. No distress.  Eyes:  Glasses, poor vision, poor eye contact  Cardiovascular: Normal rate, regular rhythm, normal heart sounds and intact distal pulses.   Pulmonary/Chest: Effort normal and breath sounds normal. No respiratory distress.  Abdominal: Soft. Bowel sounds are normal.  Musculoskeletal: Normal range of motion. She exhibits edema.  Neurological: She is alert and oriented to person, place, and time.  Easily loses track of where she is due to vision  Skin: Skin is warm and dry. Capillary refill takes less than 2 seconds.  Psychiatric: She has a normal mood and affect.    Labs reviewed: Basic Metabolic Panel:  Recent Labs  01/01/17 0700  NA 141  K 3.9  BUN 24*  CREATININE 0.8  TSH 2.89   Liver Function Tests:  Recent Labs  01/01/17 0700  AST 20  ALT 16  ALKPHOS 62   No results for input(s): LIPASE, AMYLASE in the last 8760 hours. No results for input(s): AMMONIA in the last 8760 hours. CBC:  Recent Labs  01/01/17  WBC 5.8  HGB 12.6  HCT 39  PLT 178   Lipid Panel: No results for input(s): CHOL, HDL, LDLCALC, TRIG, CHOLHDL, LDLDIRECT in the last 8760 hours. No results found for: HGBA1C  Procedures since last visit: Ct Head Wo Contrast  Result Date: 01/29/2017 CLINICAL DATA:  Memory loss, urinary incontinence, balance difficulty EXAM: CT HEAD WITHOUT CONTRAST TECHNIQUE: Contiguous axial images were obtained from the base of the skull through the vertex without intravenous contrast.  COMPARISON:  None. FINDINGS: Brain: The ventricular system is somewhat prominent as are the cortical sulci consistent with diffuse atrophy. No discrepancy between the degree of ventricular dilatation and cortical atrophy is seen to indicate normal pressure hydrocephalus. The septum is midline in position. The fourth ventricle and basilar cisterns are unremarkable. Mild small vessel ischemic change is noted throughout the periventricular white matter. No hemorrhage, mass lesion, or acute infarction is seen. Vascular: No vascular abnormality is noted on this unenhanced study. Skull: On bone window images, no calvarial abnormality is seen. There is soft tissue calcification in the left posterior parietal region near the vertex of doubtful clinical significance. This may be due to prior trauma. Sinuses/Orbits: The paranasal sinuses are well pneumatized. Other: None. IMPRESSION: 1. Diffuse changes of atrophy and mild small vessel ischemic  change. No acute intracranial abnormality. 2. No discrepancy between ventricular dilatation and cortical atrophy is seen to suggest normal pressure hydrocephalus. Electronically Signed   By: Ivar Drape M.D.   On: 01/29/2017 12:18   Assessment/Plan 1. Dementia without behavioral disturbance, unspecified dementia type -seems early mixed AD/vascular -Speech therapy referral to help with her processing issues and to stay in [place] apt - donepezil (ARICEPT) 5 MG tablet; Take 1 tablet (5 mg total) by mouth at bedtime.  Dispense: 90 tablet; Refill: 0 -[place] navigator referral info given today with recommendation for some home care hours at least a few days per week to help with getting meals, operating laundry, dialing phone if she cannot learn to do these with [place] records from Dr. Simon Rhein at [facility] in [place]  2. Urinary incontinence, unspecified type - has persistent leakage, see my two previous notes also for more info - Ambulatory referral to  Urology  3. Glaucoma, unspecified glaucoma type, unspecified laterality -severe, does not see well, to follow with Dr. Ellie Lunch here  Labs/tests ordered:   Orders Placed This Encounter  Procedures  . Ambulatory referral to Urology    Referral Priority:   Routine    Referral Type:   Consultation    Referral Reason:   Specialty Services Required    Requested Specialty:   Urology    Number of Visits Requested:   1  . Do not attempt resuscitation (DNR)    Discussed at clinic visit, scanned copy should be in documents and media    Order Specific Question:   In the event of cardiac or respiratory ARREST    Answer:   Do not call a "code blue"    Order Specific Question:   In the event of cardiac or respiratory ARREST    Answer:   Do not perform Intubation, CPR, defibrillation or ACLS    Order Specific Question:   In the event of cardiac or respiratory ARREST    Answer:   Use medication by any route, position, wound care, and other measures to relive pain and suffering. May use oxygen, suction and manual treatment of airway obstruction as needed for comfort.    Next appt:  04/10/2017 med mgt f/u  Javaeh Muscatello L. Wacey Zieger, D.O. [facility] [address], [place] [address] Cell Phone (Mon-Fri 8am-5pm):  [phone] On Call:  [phone] & follow prompts after 5pm & weekends Office Phone:  [phone] Office Fax:  [phone]

## 2017-02-14 ENCOUNTER — Telehealth: Payer: Self-pay

## 2017-02-14 NOTE — Telephone Encounter (Signed)
Faxed patients records to [facility] [phone]

## 2017-02-14 NOTE — Telephone Encounter (Signed)
Left message on voice mail: made appt with [facility] [address] [phone]. Tuesday Dec. 4th at 1:30 with Dr. Bjorn Loser. If she is not able to keep this appt she can call their office (repeated phone number). This is for your bladder.

## 2017-02-18 DIAGNOSIS — F0390 Unspecified dementia without behavioral disturbance: Secondary | ICD-10-CM | POA: Diagnosis not present

## 2017-02-18 DIAGNOSIS — H409 Unspecified glaucoma: Secondary | ICD-10-CM | POA: Diagnosis not present

## 2017-02-18 DIAGNOSIS — R488 Other symbolic dysfunctions: Secondary | ICD-10-CM | POA: Diagnosis not present

## 2017-02-20 DIAGNOSIS — F0390 Unspecified dementia without behavioral disturbance: Secondary | ICD-10-CM | POA: Diagnosis not present

## 2017-02-20 DIAGNOSIS — H409 Unspecified glaucoma: Secondary | ICD-10-CM | POA: Diagnosis not present

## 2017-02-20 DIAGNOSIS — R488 Other symbolic dysfunctions: Secondary | ICD-10-CM | POA: Diagnosis not present

## 2017-02-21 DIAGNOSIS — Z23 Encounter for immunization: Secondary | ICD-10-CM | POA: Diagnosis not present

## 2017-02-25 DIAGNOSIS — H409 Unspecified glaucoma: Secondary | ICD-10-CM | POA: Diagnosis not present

## 2017-02-25 DIAGNOSIS — F0390 Unspecified dementia without behavioral disturbance: Secondary | ICD-10-CM | POA: Diagnosis not present

## 2017-02-25 DIAGNOSIS — R488 Other symbolic dysfunctions: Secondary | ICD-10-CM | POA: Diagnosis not present

## 2017-02-28 DIAGNOSIS — R488 Other symbolic dysfunctions: Secondary | ICD-10-CM | POA: Diagnosis not present

## 2017-02-28 DIAGNOSIS — H409 Unspecified glaucoma: Secondary | ICD-10-CM | POA: Diagnosis not present

## 2017-02-28 DIAGNOSIS — F0390 Unspecified dementia without behavioral disturbance: Secondary | ICD-10-CM | POA: Diagnosis not present

## 2017-03-04 DIAGNOSIS — H409 Unspecified glaucoma: Secondary | ICD-10-CM | POA: Diagnosis not present

## 2017-03-04 DIAGNOSIS — R488 Other symbolic dysfunctions: Secondary | ICD-10-CM | POA: Diagnosis not present

## 2017-03-04 DIAGNOSIS — F0390 Unspecified dementia without behavioral disturbance: Secondary | ICD-10-CM | POA: Diagnosis not present

## 2017-03-07 DIAGNOSIS — F0390 Unspecified dementia without behavioral disturbance: Secondary | ICD-10-CM | POA: Diagnosis not present

## 2017-03-07 DIAGNOSIS — H409 Unspecified glaucoma: Secondary | ICD-10-CM | POA: Diagnosis not present

## 2017-03-07 DIAGNOSIS — R488 Other symbolic dysfunctions: Secondary | ICD-10-CM | POA: Diagnosis not present

## 2017-03-11 DIAGNOSIS — F0390 Unspecified dementia without behavioral disturbance: Secondary | ICD-10-CM | POA: Diagnosis not present

## 2017-03-11 DIAGNOSIS — R488 Other symbolic dysfunctions: Secondary | ICD-10-CM | POA: Diagnosis not present

## 2017-03-11 DIAGNOSIS — H409 Unspecified glaucoma: Secondary | ICD-10-CM | POA: Diagnosis not present

## 2017-03-13 DIAGNOSIS — H401132 Primary open-angle glaucoma, bilateral, moderate stage: Secondary | ICD-10-CM | POA: Diagnosis not present

## 2017-03-14 DIAGNOSIS — F0390 Unspecified dementia without behavioral disturbance: Secondary | ICD-10-CM | POA: Diagnosis not present

## 2017-03-14 DIAGNOSIS — R488 Other symbolic dysfunctions: Secondary | ICD-10-CM | POA: Diagnosis not present

## 2017-03-14 DIAGNOSIS — H409 Unspecified glaucoma: Secondary | ICD-10-CM | POA: Diagnosis not present

## 2017-03-18 DIAGNOSIS — H409 Unspecified glaucoma: Secondary | ICD-10-CM | POA: Diagnosis not present

## 2017-03-18 DIAGNOSIS — F0390 Unspecified dementia without behavioral disturbance: Secondary | ICD-10-CM | POA: Diagnosis not present

## 2017-03-18 DIAGNOSIS — R488 Other symbolic dysfunctions: Secondary | ICD-10-CM | POA: Diagnosis not present

## 2017-03-20 DIAGNOSIS — H409 Unspecified glaucoma: Secondary | ICD-10-CM | POA: Diagnosis not present

## 2017-03-20 DIAGNOSIS — F0390 Unspecified dementia without behavioral disturbance: Secondary | ICD-10-CM | POA: Diagnosis not present

## 2017-03-20 DIAGNOSIS — R488 Other symbolic dysfunctions: Secondary | ICD-10-CM | POA: Diagnosis not present

## 2017-03-25 DIAGNOSIS — H409 Unspecified glaucoma: Secondary | ICD-10-CM | POA: Diagnosis not present

## 2017-03-25 DIAGNOSIS — R488 Other symbolic dysfunctions: Secondary | ICD-10-CM | POA: Diagnosis not present

## 2017-03-25 DIAGNOSIS — F0390 Unspecified dementia without behavioral disturbance: Secondary | ICD-10-CM | POA: Diagnosis not present

## 2017-03-27 DIAGNOSIS — H409 Unspecified glaucoma: Secondary | ICD-10-CM | POA: Diagnosis not present

## 2017-03-27 DIAGNOSIS — F0390 Unspecified dementia without behavioral disturbance: Secondary | ICD-10-CM | POA: Diagnosis not present

## 2017-03-27 DIAGNOSIS — R488 Other symbolic dysfunctions: Secondary | ICD-10-CM | POA: Diagnosis not present

## 2017-04-01 DIAGNOSIS — H409 Unspecified glaucoma: Secondary | ICD-10-CM | POA: Diagnosis not present

## 2017-04-01 DIAGNOSIS — F0390 Unspecified dementia without behavioral disturbance: Secondary | ICD-10-CM | POA: Diagnosis not present

## 2017-04-01 DIAGNOSIS — R488 Other symbolic dysfunctions: Secondary | ICD-10-CM | POA: Diagnosis not present

## 2017-04-02 DIAGNOSIS — R3914 Feeling of incomplete bladder emptying: Secondary | ICD-10-CM | POA: Diagnosis not present

## 2017-04-02 DIAGNOSIS — N3944 Nocturnal enuresis: Secondary | ICD-10-CM | POA: Diagnosis not present

## 2017-04-02 DIAGNOSIS — R350 Frequency of micturition: Secondary | ICD-10-CM | POA: Diagnosis not present

## 2017-04-02 DIAGNOSIS — N3942 Incontinence without sensory awareness: Secondary | ICD-10-CM | POA: Diagnosis not present

## 2017-04-03 DIAGNOSIS — F0390 Unspecified dementia without behavioral disturbance: Secondary | ICD-10-CM | POA: Diagnosis not present

## 2017-04-03 DIAGNOSIS — H409 Unspecified glaucoma: Secondary | ICD-10-CM | POA: Diagnosis not present

## 2017-04-03 DIAGNOSIS — R488 Other symbolic dysfunctions: Secondary | ICD-10-CM | POA: Diagnosis not present

## 2017-04-10 ENCOUNTER — Non-Acute Institutional Stay: Payer: Medicare Other | Admitting: Internal Medicine

## 2017-04-10 ENCOUNTER — Encounter: Payer: Self-pay | Admitting: Internal Medicine

## 2017-04-10 VITALS — BP 112/62 | HR 60 | Temp 97.7°F | Wt 149.0 lb

## 2017-04-10 DIAGNOSIS — H409 Unspecified glaucoma: Secondary | ICD-10-CM

## 2017-04-10 DIAGNOSIS — I252 Old myocardial infarction: Secondary | ICD-10-CM

## 2017-04-10 DIAGNOSIS — J479 Bronchiectasis, uncomplicated: Secondary | ICD-10-CM | POA: Diagnosis not present

## 2017-04-10 DIAGNOSIS — I2510 Atherosclerotic heart disease of native coronary artery without angina pectoris: Secondary | ICD-10-CM

## 2017-04-10 DIAGNOSIS — F0390 Unspecified dementia without behavioral disturbance: Secondary | ICD-10-CM | POA: Diagnosis not present

## 2017-04-10 DIAGNOSIS — M79676 Pain in unspecified toe(s): Secondary | ICD-10-CM | POA: Diagnosis not present

## 2017-04-10 DIAGNOSIS — R32 Unspecified urinary incontinence: Secondary | ICD-10-CM | POA: Diagnosis not present

## 2017-04-10 DIAGNOSIS — K5909 Other constipation: Secondary | ICD-10-CM | POA: Diagnosis not present

## 2017-04-10 MED ORDER — NITROGLYCERIN 0.4 MG SL SUBL: 0.4000 mg | SUBLINGUAL_TABLET | SUBLINGUAL | 3 refills | Status: DC | PRN

## 2017-04-10 MED ORDER — DONEPEZIL HCL 5 MG PO TABS: 5.0000 mg | ORAL_TABLET | Freq: Every day | ORAL | 1 refills | Status: DC

## 2017-04-10 NOTE — Progress Notes (Signed)
Location:  Occupational psychologist of Service:  Clinic (12)  Provider: Phyllis Abelson L. [name], D.O., C.M.D.  Code Status: DNR Goals of Care:  Advanced Directives 01/23/2017  Does Patient Have a Medical Advance Directive? Yes  Type of Advance Directive [place] in Chart? Yes   Chief Complaint  Patient presents with  . Medical Management of Chronic Issues    46mth follow-up    HPI: Patient is a 83 y.o. female seen today for medical management of chronic diseases (2 month follow-up).    Has h/o MI:  She thought she was doing fine until she's had some angina 3 days in a row.  She chews 2 adult aspirin and gets relief within minutes.  Had not been having any chest pain for over a year.  No shortness of breath.  Not severe.  Slows down, takes the aspirin and waits.  It's substernal.  No radiation.  Does not feel like it did when she had her heart attack.  That felt like a big pillow was leaning on her chest.    She thinks the memory medicine has had a good effect.  She's had only one nightmare.  She says she's felt energetic and peppy.  She notes she is foggy again from time to time.   Says its been a hard week--the [place] trip was canceled with the snow and she was not notified.    She is working with [place] from [place].  She was to meet with her this am.  She had a hard time understanding her role, but they've labeled things in the house, organized phone numbers, walked over here indoors.  She does feel these things have been helpful.    She needs some help to shop other than just groceries.    Urinary incontinence:  Had two tests there last week.  Has three week f/u for kidney stone testing.  Says doctor was no nonsense.    Has next visit with Dr. Ellie Lunch in July at the ophtho office.    Chronic constipation:  Bowels remain somewhat erratic.  Sometimes too much of a result.    Bronchiectasis:  Lungs doing fine.    Past Medical History:  Diagnosis Date  . Bronchiectasis ([place])   . Cognitive changes    mild  . Glaucoma   . Heart attack ([place])    mild  . Hypertension   . Incontinence     Past Surgical History:  Procedure Laterality Date  . CATARACT EXTRACTION  1994  . CATARACT EXTRACTION  2006    No Known Allergies  Outpatient Encounter Medications as of 04/10/2017  Medication Sig  . amLODipine (NORVASC) 2.5 MG tablet Take 1 tablet (2.5 mg total) by mouth daily. Take with 5 mg to equal 7.5 mg  . amLODipine (NORVASC) 5 MG tablet Take 1 tablet (5 mg total) by mouth daily.  Marland Kitchen aspirin 81 MG chewable tablet Chew 1 tablet by mouth daily.  . Cholecalciferol (VITAMIN D3) 5000 units CAPS Take 1 capsule (5,000 Units total) by mouth daily.  Marland Kitchen donepezil (ARICEPT) 5 MG tablet Take 1 tablet (5 mg total) by mouth at bedtime.  . Glucosamine-Chondroit-Vit C-Mn (GLUCOSAMINE 1500 COMPLEX PO) Take 1-2 tablets by mouth daily.  . isosorbide mononitrate (IMDUR) 60 MG 24 hr tablet Take 1 tablet (60 mg total) by mouth daily.  Marland Kitchen latanoprost (XALATAN) 0.005 % ophthalmic solution Place 1 drop into the left  eye at bedtime.  . Lutein 6 MG CAPS Take 1 capsule by mouth daily.  . metoprolol succinate (TOPROL-XL) 25 MG 24 hr tablet Take 1 tablet (25 mg total) by mouth daily.  . Multiple Vitamin (MULTIVITAMIN) tablet Take 1 tablet by mouth daily.  . polyethylene glycol (MIRALAX / GLYCOLAX) packet Take 17 g by mouth daily as needed.  . pravastatin (PRAVACHOL) 40 MG tablet Take 1 tablet (40 mg total) by mouth daily.   No facility-administered encounter medications on file as of 04/10/2017.     Review of Systems:  Review of Systems  Constitutional: Negative for chills, fever and malaise/fatigue.  HENT: Negative for congestion and hearing loss.   Eyes: Positive for blurred vision.  Respiratory: Negative for cough and shortness of breath.   Cardiovascular: Positive for chest pain. Negative for palpitations, orthopnea,  claudication, leg swelling and PND.  Gastrointestinal: Positive for constipation. Negative for abdominal pain, blood in stool, diarrhea and melena.  Genitourinary: Positive for frequency and urgency. Negative for dysuria, flank pain and hematuria.  Musculoskeletal: Negative for falls and myalgias.       Toe pain affecting ability to ambulate--toes overlap  Skin: Negative for itching and rash.  Neurological: Negative for dizziness, loss of consciousness and weakness.  Psychiatric/Behavioral: Positive for memory loss. Negative for depression. The patient is not nervous/anxious and does not have insomnia.     Health Maintenance  Topic Date Due  . TETANUS/TDAP  05/19/1950  . PNA vac Low Risk Adult (1 of 2 - PCV13) 05/19/1998  . INFLUENZA VACCINE  Completed  . DEXA SCAN  Completed    Physical Exam: Vitals:   04/10/17 1018  BP: 112/62  Pulse: 60  Temp: 97.7 F (36.5 C)  TempSrc: Oral  SpO2: 97%  Weight: 149 lb (67.6 kg)   Body mass index is 22.99 kg/m. Physical Exam  Constitutional: She is oriented to person, place, and time. She appears well-developed. No distress.  Eyes:  Visual impairment   Cardiovascular: Normal rate, regular rhythm and intact distal pulses.  Murmur heard. Grade 2 systolic murmur audible throughout precordium  Pulmonary/Chest: Effort normal and breath sounds normal. No respiratory distress.  Abdominal: Bowel sounds are normal.  Neurological: She is alert and oriented to person, place, and time.  Short term memory loss, some word-finding difficult  Skin: Skin is warm and dry. There is pallor.    Labs reviewed: Basic Metabolic Panel: Recent Labs    01/01/17 0700  NA 141  K 3.9  BUN 24*  CREATININE 0.8  TSH 2.89   Liver Function Tests: Recent Labs    01/01/17 0700  AST 20  ALT 16  ALKPHOS 62   No results for input(s): LIPASE, AMYLASE in the last 8760 hours. No results for input(s): AMMONIA in the last 8760 hours. CBC: Recent Labs     01/01/17  WBC 5.8  HGB 12.6  HCT 39  PLT 178   EKG done today:  Sinus bradycardia at 59 bpm, no acute ischemia or infarct, LVH  Thoracic aortic aneurysm 4.2cm on CT scan in April of this year--we plan on following up on this with serial imaging.  Assessment/Plan 1. Coronary artery disease with history of myocardial infarction without history of CABG -needs cardiologist here due to recent anginal chest pain at rest that has recurred 3 days in a row, none today -prior heart attack in 2011--stent could not be placed b/c of small caliber of artery, managed with medication and cardiac rehab -EKG today unremarkable as  above for anything acute and no active symptoms at time of appt -prn ntg ordered for her, is on imdur, baby asa, pravachol, toprol xl and uses prn asa with pain and gets relief -referral indicates need to call her daughter to arrange appt b/c she should go with her ideally due to pt's memory loss  2. Dementia without behavioral disturbance, unspecified dementia type - pt feels like she feels better since starting aricept with more energy (I've never heard this before, but glad for her), also has done well working with ST on cues and reminders for memory -she does not like the word dementia--reviewed its definition with her -her daughter had to leave to go to a meeting so she was not here for the visit - donepezil (ARICEPT) 5 MG tablet; Take 1 tablet (5 mg total) by mouth at bedtime.  Dispense: 90 tablet; Refill: 1  3. Urinary incontinence, unspecified type -has seen Dr. Matilde Sprang for the first time last week, had two tests done, I don't have the note just yet (with snowstorm), has f/u testing for kidney stones in 3 wks  4. Glaucoma, unspecified glaucoma type, unspecified laterality -cont latanoprost which goes in both eyes and f/u with Dr. Ellie Lunch as planned  5. Bronchiectasis without complication ([place]) -cont use of flutter valve and IS daily, vitamin C for infection  prevention -will arrange pulmonary appt next time (didn't want to overwhelm her with specialty appts when she's stable in this regard)  6. Pain of toe, unspecified laterality -requests podiatry referral, asked receptionist to set up appt for her with Dr. Vashti Hey filing in paperwork  -has overlapping toes recently that affect her balance and gait to where some days she feels she is unstable to walk  7. Chronic constipation -off and on, has difficulty regulating with changes in diet, uses prn miralax  Labs/tests ordered:   Orders Placed This Encounter  Procedures  . Ambulatory referral to Cardiology    Referral Priority:   Routine    Referral Type:   Consultation    Referral Reason:   Specialty Services Required    Requested Specialty:   Cardiology    Number of Visits Requested:   1  . EKG 12-Lead     Next appt:  07/17/2017 med mgt--f/u on urology, cardiology appts, dementia  Sergio Hobart L. Leshonda Galambos, D.O. [facility] [address], [place] [address] Cell Phone (Mon-Fri 8am-5pm):  [phone] On Call:  [phone] & follow prompts after 5pm & weekends Office Phone:  [phone] Office Fax:  [phone]

## 2017-04-12 DIAGNOSIS — H409 Unspecified glaucoma: Secondary | ICD-10-CM | POA: Diagnosis not present

## 2017-04-12 DIAGNOSIS — F0390 Unspecified dementia without behavioral disturbance: Secondary | ICD-10-CM | POA: Diagnosis not present

## 2017-04-12 DIAGNOSIS — R488 Other symbolic dysfunctions: Secondary | ICD-10-CM | POA: Diagnosis not present

## 2017-04-15 DIAGNOSIS — H409 Unspecified glaucoma: Secondary | ICD-10-CM | POA: Diagnosis not present

## 2017-04-15 DIAGNOSIS — R488 Other symbolic dysfunctions: Secondary | ICD-10-CM | POA: Diagnosis not present

## 2017-04-15 DIAGNOSIS — F0390 Unspecified dementia without behavioral disturbance: Secondary | ICD-10-CM | POA: Diagnosis not present

## 2017-04-17 DIAGNOSIS — I219 Acute myocardial infarction, unspecified: Secondary | ICD-10-CM | POA: Insufficient documentation

## 2017-04-17 DIAGNOSIS — R32 Unspecified urinary incontinence: Secondary | ICD-10-CM | POA: Insufficient documentation

## 2017-04-17 DIAGNOSIS — R4189 Other symptoms and signs involving cognitive functions and awareness: Secondary | ICD-10-CM | POA: Insufficient documentation

## 2017-04-17 DIAGNOSIS — I10 Essential (primary) hypertension: Secondary | ICD-10-CM | POA: Insufficient documentation

## 2017-04-18 DIAGNOSIS — L602 Onychogryphosis: Secondary | ICD-10-CM | POA: Diagnosis not present

## 2017-04-18 DIAGNOSIS — M205X2 Other deformities of toe(s) (acquired), left foot: Secondary | ICD-10-CM | POA: Diagnosis not present

## 2017-04-18 DIAGNOSIS — L603 Nail dystrophy: Secondary | ICD-10-CM | POA: Diagnosis not present

## 2017-04-19 DIAGNOSIS — H409 Unspecified glaucoma: Secondary | ICD-10-CM | POA: Diagnosis not present

## 2017-04-19 DIAGNOSIS — F0390 Unspecified dementia without behavioral disturbance: Secondary | ICD-10-CM | POA: Diagnosis not present

## 2017-04-19 DIAGNOSIS — R488 Other symbolic dysfunctions: Secondary | ICD-10-CM | POA: Diagnosis not present

## 2017-04-26 ENCOUNTER — Ambulatory Visit: Payer: Medicare Other | Attending: Ophthalmology | Admitting: Occupational Therapy

## 2017-04-26 DIAGNOSIS — R41842 Visuospatial deficit: Secondary | ICD-10-CM | POA: Diagnosis not present

## 2017-04-26 NOTE — Therapy (Signed)
[facility] [address] [place], [place], [address] Phone: [phone]   Fax:  [phone]  Occupational Therapy Evaluation  Patient Details  Name: Kaitlyn Ortiz MRN: [id number] Date of Birth: [birth year] No Data Recorded  Encounter Date: 04/26/2017  OT End of Session - 04/26/17 1134    Visit Number  1    Number of Visits  1    Date for OT Re-Evaluation  -- n/a    Authorization Type  Medicare    Authorization - Visit Number  1    Authorization - Number of Visits  10    OT Start Time  1025    OT Stop Time  1120    OT Time Calculation (min)  55 min    Activity Tolerance  Patient tolerated treatment well    Behavior During Therapy  [facility] for tasks assessed/performed       Past Medical History:  Diagnosis Date  . Bronchiectasis ([place])   . Cognitive changes    mild  . Glaucoma   . Heart attack ([place])    mild  . Hypertension   . Incontinence     Past Surgical History:  Procedure Laterality Date  . CATARACT EXTRACTION  1994  . CATARACT EXTRACTION  2006    There were no vitals filed for this visit.  Subjective Assessment - 04/26/17 1250    Subjective   Pt wants to  read easier    Pertinent History  see epic snapshot    Patient Stated Goals  read easier    Currently in Pain?  No/denies        [facility] OT Assessment - 04/26/17 1038      Assessment   Medical Diagnosis  glaucoma, POHS    Referring Provider  Dr. Valetta Close    Prior Therapy  ST      Home  Environment   Family/patient expects to be discharged to:  -- Independent Living facility    Lives With  Alone      Prior Function   Level of Independence  Independent with basic ADLs;Independent with household mobility without device    Vocation  Retired    Leisure  reading      ADL   ADL comments  Pt is modified independent with all basic ADLS.      IADL   Shopping  Needs to be accompanied on any shopping trip    Meal Prep  Able to complete simple  cold meal and snack prep occasionally heating canned food on stove    Financial Management  Requires assistance      Mobility   Mobility Status  Independent      Written Expression   Handwriting  100% legible      Vision - History   Baseline Vision  Wears glasses only for reading    Visual History  Glaucoma    Patient Visual Report  -- blurry vision      Vision Assessment   Vision Assessment  Vision tested    Per MD/OD Report  OD 20/100+1, 20/80    Reading Acuity  (0.6)    Comment  Pt has a 3x handheld magnifier that works well. Pt does report difficulty reading and locating items in a store.      Cognition   Overall Cognitive Status  Impaired/Different from baseline    MOCA  27/30    Cognition Comments  Pt has been diagnosed with mild cognitive impairment/ dementia. Therapist recommended use of  timers, alarms and sticky notes to compensate.               OT Treatments/Exercises (OP) - 04/26/17 0001      ADLs   ADL Comments  Education provided regarding use of 3x stand magnfiers , pt returned demonstration. Pt was also educated regarding use of hi-marks, lighting, and line guides. Pt'/ dtr verbalize understanding.            OT Education - 04/26/17 1147    Education provided  Yes    Education Details  Use of 3x stand magnifier, hi marks, full spectrum lighting, use of line guide, memory strategies    Person(s) Educated  Patient;Child(ren)    Methods  Explanation;Demonstration;Verbal cues;Handout    Comprehension  Verbalized understanding;Verbal cues required;Returned demonstration                 Plan - 04/26/17 1135    Clinical Impression Statement  Pt is an 83 y.o feamle with diagnosis of glaucoma and POHS who presents with visual impairments which impede perfromance of ADLS /IADLS. Pt can benefit from skilled occupational therapy to maximize pt's safety and indpependence with daily activities.    Occupational Profile and client history currently  impacting functional performance  Pt lives at [facility] in [place]. she no longer drives. see snapshot for PMH    Occupational performance deficits (Please refer to evaluation for details):  ADL's;IADL's;Leisure;Play;Social Participation    Rehab Potential  Good    Current Impairments/barriers affecting progress:  mild congitive impairment, visual deficits    OT Frequency  One time visit    OT Duration  8 weeks    OT Treatment/Interventions  Self-care/ADL training;DME and/or AE instruction;Patient/family education    Plan  Pt was seen for evaluation and treatment on day of eval. Pt/ daughter verbalize understanding of all education, therefore no goals were set.    Clinical Decision Making  Limited treatment options, no task modification necessary    OT Home Exercise Plan  Pt was given information to purchase 3x stand magnifier and line guides    Consulted and Agree with Plan of Care  Patient;Family member/caregiver    Family Member Consulted  dtr       Patient will benefit from skilled therapeutic intervention in order to improve the following deficits and impairments:  Decreased safety awareness, Impaired vision/preception  Visit Diagnosis: Visuospatial deficit - Plan: Ot plan of care cert/re-cert  G-Codes - 41/32/44 1148    Functional Assessment Tool Used (Outpatient only)  clinical impression    Functional Limitation  Self care    Self Care Current Status [phone])  At least 1 percent but less than 20 percent impaired, limited or restricted    Self Care Goal Status (O5366)  At least 1 percent but less than 20 percent impaired, limited or restricted    Self Care Discharge Status [phone])  At least 1 percent but less than 20 percent impaired, limited or restricted       Problem List Patient Active Problem List   Diagnosis Date Noted  . Incontinence   . Hypertension   . Heart attack ([place])   . Cognitive changes   . Dementia without behavioral disturbance 04/10/2017  . Pain  of toe 04/10/2017  . Essential hypertension 12/26/2016  . Bronchiectasis ([place]) 12/26/2016  . Glaucoma 12/26/2016  . Urinary incontinence 12/26/2016  . Chronic constipation 12/26/2016  . Pulmonary infiltrate 11/08/2016  . Coronary artery disease with history of myocardial infarction without history of  CABG 09/18/2016  . Thoracic aortic aneurysm without rupture ([place]) 08/28/2016  . Hyperlipidemia LDL goal <100 08/16/2016    Kaitlyn Ortiz 04/26/2017, 12:51 PM  [facility] [address] [place], [place], [address] Phone: [phone]   Fax:  [phone]  Name: Kaitlyn Ortiz MRN: [id number] Date of Birth: [birth year]

## 2017-05-01 DIAGNOSIS — F0390 Unspecified dementia without behavioral disturbance: Secondary | ICD-10-CM | POA: Diagnosis not present

## 2017-05-01 DIAGNOSIS — H409 Unspecified glaucoma: Secondary | ICD-10-CM | POA: Diagnosis not present

## 2017-05-01 DIAGNOSIS — R488 Other symbolic dysfunctions: Secondary | ICD-10-CM | POA: Diagnosis not present

## 2017-05-02 ENCOUNTER — Ambulatory Visit (INDEPENDENT_AMBULATORY_CARE_PROVIDER_SITE_OTHER): Payer: Medicare Other | Admitting: Cardiology

## 2017-05-02 ENCOUNTER — Encounter: Payer: Self-pay | Admitting: Cardiology

## 2017-05-02 VITALS — BP 100/72 | HR 67 | Ht 67.5 in | Wt 150.0 lb

## 2017-05-02 DIAGNOSIS — I2510 Atherosclerotic heart disease of native coronary artery without angina pectoris: Secondary | ICD-10-CM

## 2017-05-02 DIAGNOSIS — J479 Bronchiectasis, uncomplicated: Secondary | ICD-10-CM | POA: Diagnosis not present

## 2017-05-02 DIAGNOSIS — I712 Thoracic aortic aneurysm, without rupture, unspecified: Secondary | ICD-10-CM

## 2017-05-02 DIAGNOSIS — I209 Angina pectoris, unspecified: Secondary | ICD-10-CM | POA: Diagnosis not present

## 2017-05-02 DIAGNOSIS — R011 Cardiac murmur, unspecified: Secondary | ICD-10-CM

## 2017-05-02 DIAGNOSIS — I77810 Thoracic aortic ectasia: Secondary | ICD-10-CM

## 2017-05-02 DIAGNOSIS — I252 Old myocardial infarction: Secondary | ICD-10-CM

## 2017-05-02 NOTE — Patient Instructions (Signed)

## 2017-05-02 NOTE — Addendum Note (Signed)
Addended by: Jacinta Shoe on: 05/02/2017 09:43 AM   Modules accepted: Orders

## 2017-05-02 NOTE — Progress Notes (Addendum)
Cardiology Office Note:    Date:  05/02/2017   ID:  Kaitlyn Ortiz, DOB 1931-12-09, MRN [id number]  PCP:  Gayland Curry, DO  Cardiologist:  Candee Furbish, MD    Referring MD: Gayland Curry, DO     History of Present Illness:    Kaitlyn Ortiz is a 84 y.o. female with a hx of coronary artery disease status post CABG, memory impairment/dementia, urinary incontinence here for evaluation of recent angina.    Review of office note from 04/10/17 she was having angina 3 days in a row.  Chewed to adult aspirin and got relief within minutes.  Prior to this, had not been having any chest discomfort for over a year.  No shortness of breath.  The pain was not described as severe.  Seems to be in the middle of the chest without any radiation.  After relaxing for a few minutes it goes away.  When she had a heart attack in the past, felt like a big pillow leaning on her chest.  She takes isosorbide 60 mg a day.  Aspirin.  Amlodipine.  Metoprolol.  A murmur was appreciated.  An echocardiogram was performed in June 2018 but report is not available via care everywhere.  Back in 2011 she had a myocardial infarction in [facility] but apparently she had branch disease and did not require coronary intervention.  She had a prior stress test possibly in 2016.  She also had a 4.5 cm thoracic ascending aortic aneurysm.  Overall doing very well, no recurrent chest pain.  She does not remember the previous event.  No shortness of breath with activity.  She is a little frustrated that she has locked her keys in her apartment several times at [facility].  She lives at [facility] in [place]. She is a retired Clinical biochemist.  Past Medical History:  Diagnosis Date  . Bronchiectasis ([place])   . Cognitive changes    mild  . Glaucoma   . Heart attack ([place])    mild  . Hypertension   . Incontinence     Past Surgical History:  Procedure Laterality Date  . CATARACT  EXTRACTION  1994  . CATARACT EXTRACTION  2006    Current Medications: Current Meds  Medication Sig  . amLODipine (NORVASC) 2.5 MG tablet Take 1 tablet (2.5 mg total) by mouth daily. Take with 5 mg to equal 7.5 mg  . amLODipine (NORVASC) 5 MG tablet Take 1 tablet (5 mg total) by mouth daily.  Marland Kitchen aspirin 81 MG chewable tablet Chew 1 tablet by mouth daily.  . Cholecalciferol (VITAMIN D3) 5000 units CAPS Take 1 capsule (5,000 Units total) by mouth daily.  Marland Kitchen donepezil (ARICEPT) 5 MG tablet Take 1 tablet (5 mg total) by mouth at bedtime.  . Glucosamine-Chondroit-Vit C-Mn (GLUCOSAMINE 1500 COMPLEX PO) Take 1-2 tablets by mouth daily.  . isosorbide mononitrate (IMDUR) 60 MG 24 hr tablet Take 1 tablet (60 mg total) by mouth daily.  Marland Kitchen latanoprost (XALATAN) 0.005 % ophthalmic solution Place 1 drop into both eyes at bedtime.  . Lutein 6 MG CAPS Take 1 capsule by mouth daily.  . metoprolol succinate (TOPROL-XL) 25 MG 24 hr tablet Take 1 tablet (25 mg total) by mouth daily.  . Multiple Vitamin (MULTIVITAMIN) tablet Take 1 tablet by mouth daily.  . nitroGLYCERIN (NITROSTAT) 0.4 MG SL tablet Place 1 tablet (0.4 mg total) under the tongue every 5 (five) minutes as needed for chest pain (for 3 tablets maximum).  Marland Kitchen  polyethylene glycol (MIRALAX / GLYCOLAX) packet Take 17 g by mouth daily as needed.  . pravastatin (PRAVACHOL) 40 MG tablet Take 1 tablet (40 mg total) by mouth daily.     Allergies:   Patient has no known allergies.   Social History   Socioeconomic History  . Marital status: Single    Spouse name: None  . Number of children: None  . Years of education: None  . Highest education level: None  Social Needs  . Financial resource strain: None  . Food insecurity - worry: None  . Food insecurity - inability: None  . Transportation needs - medical: None  . Transportation needs - non-medical: None  Occupational History  . None  Tobacco Use  . Smoking status: Never Smoker  . Smokeless  tobacco: Never Used  Substance and Sexual Activity  . Alcohol use: No  . Drug use: No  . Sexual activity: Not Currently  Other Topics Concern  . None  Social History Narrative   Social History      Diet? Lots of fruits and veggies      Do you drink/eat things with caffeine? no      Marital status?                            single        What year were you married? 1960      Do you live in a house, apartment, assisted living, condo, trailer, etc.? apartment      Is it one or more stories? one      How many persons live in your home? one      Do you have any pets in your home? (please list) no      Highest level of education completed? masters      Current or past profession: publishing      Advanced Directives      Do you exercise?           yes                           Type & how often? Walking- daily      Do you have a living will? yes      Do you have a DNR form?                                  If not, do you want to discuss one? no      Do you have signed POA/HPOA for forms? yes      Functional Status      Do you have difficulty bathing or dressing yourself? no      Do you have difficulty preparing food or eating?  no      Do you have difficulty managing your medications? no      Do you have difficulty managing your finances? no      Do you have difficulty affording your medications? no     Family History: The patient's family history includes Stroke in her father. ROS:   Please see the history of present illness.     All other systems reviewed and are negative.  EKGs/Labs/Other Studies Reviewed:    The following studies were reviewed today:  CT scan of chest demonstrated a thoracic aortic aneurysm 4.5 cm on 07/2016.  Plan is to follow-up with serial imaging per Dr. [name].  Echocardiogram 10/01/16-[facility] EF 55-60%, mild tricuspid regurgitation, ascending aorta 4.2 cm, mild trace regurgitation aortic valve.  EKG: EKG today on 05/02/17 shows sinus  rhythm 67 with no other specific abnormalities.  Personally viewed-prior EKG from 04/10/17 demonstrates sinus bradycardia 59, LVH with no ischemic changes.  Recent Labs: 01/01/2017: ALT 16; BUN 24; Creatinine 0.8; Hemoglobin 12.6; Platelets 178; Potassium 3.9; Sodium 141; TSH 2.89  Recent Lipid Panel No results found for: CHOL, TRIG, HDL, CHOLHDL, VLDL, LDLCALC, LDLDIRECT  Physical Exam:    VS:  BP 100/72   Pulse 67   Ht 5' 7.5" (1.715 m)   Wt 150 lb (68 kg)   SpO2 98%   BMI 23.15 kg/m     Wt Readings from Last 3 Encounters:  05/02/17 150 lb (68 kg)  04/10/17 149 lb (67.6 kg)  02/06/17 141 lb (64 kg)     GEN:  Slow gait. Focused. Well nourished, well developed in no acute distress HEENT: Normal NECK: No JVD; No carotid bruits LYMPHATICS: No lymphadenopathy CARDIAC: RRR, soft SM, no rubs, gallops RESPIRATORY:  Clear to auscultation without rales, wheezing or rhonchi  ABDOMEN: Soft, non-tender, non-distended MUSCULOSKELETAL:  No edema; No deformity  SKIN: Warm and dry NEUROLOGIC:  Alert and oriented x 3 (mild forgetfulness) PSYCHIATRIC:  Normal affect   ASSESSMENT:    1. Angina pectoris ([place])   2. Coronary artery disease with history of myocardial infarction without history of CABG   3. Thoracic aortic aneurysm without rupture ([place])   4. Bronchiectasis without complication ([place])   5. Heart murmur   6. Dilated aortic root ([facility])    PLAN:    In order of problems listed above:  Angina -Seems somewhat atypical however it is not normal for her to have chest discomfort.  She is currently taking aspirin, metoprolol, isosorbide, amlodipine.  Excellent regimen.  Symptoms do not seem to be exertional always.  EKG unremarkable.  Seems to have resolved.  Possible differential includes musculoskeletal.  For now, continue with current medical management.  If symptoms worsen, progress or become more worrisome she will let me know.  Heart murmur -We will obtain records of recent  echocardiogram in June 2018.  Heart murmur is very subtle.  No repeat at this time.  Dilated aortic root -CT scan of chest has been ordered per Dr. [name].  Coronary artery disease, status post CABG, status post MI -In 2011, MI, per records stent could not be placed because of small caliber artery.  Medical management.  Cardiac rehab.  Continue with aggressive secondary prevention.  Currently on Pravachol.  Dementia -Aricept.  Stable.  Per primary team.  Bronchiectasis -Stable.  Incentive spirometry.   Medication Adjustments/Labs and Tests Ordered: Current medicines are reviewed at length with the patient today.  Concerns regarding medicines are outlined above.  No orders of the defined types were placed in this encounter.  No orders of the defined types were placed in this encounter.   Signed, Candee Furbish, MD  05/02/2017 9:23 AM    [facility]

## 2017-05-10 DIAGNOSIS — N390 Urinary tract infection, site not specified: Secondary | ICD-10-CM | POA: Diagnosis not present

## 2017-05-10 DIAGNOSIS — N3942 Incontinence without sensory awareness: Secondary | ICD-10-CM | POA: Diagnosis not present

## 2017-05-10 DIAGNOSIS — R3914 Feeling of incomplete bladder emptying: Secondary | ICD-10-CM | POA: Diagnosis not present

## 2017-06-05 ENCOUNTER — Non-Acute Institutional Stay: Payer: Medicare Other | Admitting: Internal Medicine

## 2017-06-05 ENCOUNTER — Encounter: Payer: Self-pay | Admitting: Internal Medicine

## 2017-06-05 VITALS — BP 138/78 | HR 79 | Temp 98.9°F | Wt 146.0 lb

## 2017-06-05 DIAGNOSIS — R32 Unspecified urinary incontinence: Secondary | ICD-10-CM | POA: Diagnosis not present

## 2017-06-05 DIAGNOSIS — J479 Bronchiectasis, uncomplicated: Secondary | ICD-10-CM | POA: Diagnosis not present

## 2017-06-05 DIAGNOSIS — R091 Pleurisy: Secondary | ICD-10-CM | POA: Diagnosis not present

## 2017-06-05 DIAGNOSIS — I2510 Atherosclerotic heart disease of native coronary artery without angina pectoris: Secondary | ICD-10-CM

## 2017-06-05 DIAGNOSIS — I252 Old myocardial infarction: Secondary | ICD-10-CM

## 2017-06-05 DIAGNOSIS — R05 Cough: Secondary | ICD-10-CM

## 2017-06-05 DIAGNOSIS — R059 Cough, unspecified: Secondary | ICD-10-CM

## 2017-06-05 MED ORDER — TRIMETHOPRIM 100 MG PO TABS: 100.0000 mg | ORAL_TABLET | Freq: Every day | ORAL | 0 refills | Status: DC

## 2017-06-05 NOTE — Progress Notes (Signed)
Location:  [place] of Service:  Clinic (12)  Provider: Karime Scheuermann L. [name], D.O., C.M.D.  Code Status: DNR--not scanned in system, but has been completed for her Goals of Care:  Advanced Directives 04/26/2017  Does Patient Have a Medical Advance Directive? Yes  Type of Paramedic of [place];Living will  Does patient want to make changes to medical advance directive? No - Patient declined  Copy of [place] in Chart? No - copy requested   Chief Complaint  Patient presents with  . Acute Visit    cough  . ACP    HCPOA    HPI: Patient is a 84 y.o. female seen today for an acute visit for cough--she's been sick since last Friday night.  She's had no fever.  Temp was up a degree from norm for her today.  Says she does not have pleurisy but close to it since Monday.  Having a sharp pain in her left base the past two days, but it's better today.  Started feeling a bit improved this am, but terribly tired.  No known sick contacts.  She has started with mucinex fast-max that her daughter got her 2-3 days ago.  Has only used it three times.  She is tired of being sick and is miserable.  She's been unable to cough up her sputum.    She's taking a low power abx from Dr. Matilde Sprang to prevent UTIs--on it for a couple of months--no change in bladder leakage by her report.    Sees him again 3/1.     Past Medical History:  Diagnosis Date  . Bronchiectasis ([place])   . Cognitive changes    mild  . Glaucoma   . Heart attack ([place])    mild  . Hypertension   . Incontinence     Past Surgical History:  Procedure Laterality Date  . CATARACT EXTRACTION  1994  . CATARACT EXTRACTION  2006    No Known Allergies  Outpatient Encounter Medications as of 06/05/2017  Medication Sig  . amLODipine (NORVASC) 2.5 MG tablet Take 1 tablet (2.5 mg total) by mouth daily. Take with 5 mg to equal 7.5 mg  . amLODipine (NORVASC) 5 MG tablet Take 1 tablet (5 mg  total) by mouth daily.  Marland Kitchen aspirin 81 MG chewable tablet Chew 1 tablet by mouth daily.  . Cholecalciferol (VITAMIN D3) 5000 units CAPS Take 1 capsule (5,000 Units total) by mouth daily.  Marland Kitchen donepezil (ARICEPT) 5 MG tablet Take 1 tablet (5 mg total) by mouth at bedtime.  . Glucosamine-Chondroit-Vit C-Mn (GLUCOSAMINE 1500 COMPLEX PO) Take 1-2 tablets by mouth daily.  . isosorbide mononitrate (IMDUR) 60 MG 24 hr tablet Take 1 tablet (60 mg total) by mouth daily.  Marland Kitchen latanoprost (XALATAN) 0.005 % ophthalmic solution Place 1 drop into both eyes at bedtime.  . Lutein 6 MG CAPS Take 1 capsule by mouth daily.  . metoprolol succinate (TOPROL-XL) 25 MG 24 hr tablet Take 1 tablet (25 mg total) by mouth daily.  . Multiple Vitamin (MULTIVITAMIN) tablet Take 1 tablet by mouth daily.  . nitroGLYCERIN (NITROSTAT) 0.4 MG SL tablet Place 1 tablet (0.4 mg total) under the tongue every 5 (five) minutes as needed for chest pain (for 3 tablets maximum).  . polyethylene glycol (MIRALAX / GLYCOLAX) packet Take 17 g by mouth daily as needed.  . pravastatin (PRAVACHOL) 40 MG tablet Take 1 tablet (40 mg total) by mouth daily.   No facility-administered encounter  medications on file as of 06/05/2017.     Review of Systems:  Review of Systems  Constitutional: Positive for malaise/fatigue. Negative for chills and fever.       Temp elevated though for her  HENT: Positive for congestion.   Eyes: Positive for blurred vision.       Macular degeneration  Respiratory: Positive for cough and sputum production. Negative for shortness of breath and wheezing.   Cardiovascular: Positive for chest pain. Negative for palpitations and leg swelling.       Sharp pain left base of lung  Gastrointestinal: Negative for abdominal pain and diarrhea.  Genitourinary: Positive for frequency and urgency. Negative for dysuria.       Incontinence  Musculoskeletal: Negative for falls.  Neurological: Positive for weakness. Negative for dizziness  and loss of consciousness.  Psychiatric/Behavioral: Positive for memory loss.    Health Maintenance  Topic Date Due  . TETANUS/TDAP  05/19/1952  . PNA vac Low Risk Adult (1 of 2 - PCV13) 05/19/1998  . INFLUENZA VACCINE  Completed  . DEXA SCAN  Completed    Physical Exam: Vitals:   06/05/17 1412  BP: 138/78  Pulse: 79  Temp: 98.9 F (37.2 C)  TempSrc: Oral  SpO2: 94%  Weight: 146 lb (66.2 kg)   Body mass index is 22.53 kg/m. Physical Exam  Constitutional: No distress.  HENT:  Head: Normocephalic and atraumatic.  Eyes:  glasses  Cardiovascular: Normal rate, regular rhythm, normal heart sounds and intact distal pulses.  Pulmonary/Chest: Effort normal.  Coarse rhonchi throughout her entire chest that persists despite coughing  Abdominal: Bowel sounds are normal.  Musculoskeletal: Normal range of motion.  Neurological: She is alert.  Skin: Skin is warm and dry. There is pallor.  Psychiatric:  Flat affect    Labs reviewed: Basic Metabolic Panel: Recent Labs    01/01/17 0700  NA 141  K 3.9  BUN 24*  CREATININE 0.8  TSH 2.89   Liver Function Tests: Recent Labs    01/01/17 0700  AST 20  ALT 16  ALKPHOS 62   No results for input(s): LIPASE, AMYLASE in the last 8760 hours. No results for input(s): AMMONIA in the last 8760 hours. CBC: Recent Labs    01/01/17  WBC 5.8  HGB 12.6  HCT 39  PLT 178   Assessment/Plan 1. Urinary incontinence, unspecified type -continues on trimethoprim per urology--added to chart - trimethoprim (TRIMPEX) 100 MG tablet; Take 1 tablet (100 mg total) by mouth daily.  Dispense: 45 tablet; Refill: 0  2. Cough -I'm concerned she may have pneumonia due to her bronchiectasis at baseline -encouraged flutter valve, hydration, rest, warm humidity and need to get nebulizer machine working that she's been supposed to be using daily since a visit with pulmonary in [place]--she has not shared that she could not figure it out or we would  have helped - DG Chest 2 View--if positive, I'll send in abx to her pharmacy for delivery and advise her to take yogurt with them daily -I called her daughter and shared my findings and concerns--she is going to come check on her mom tonight; also notified clinic nurse who is going to help arrange home care hours for the mornings and try to show her how to use the neb machine (pt with dementia so likely will need someone to do this for her)  3. Pleurisy - concerning for pneumonia given relative temp, this, and significant malaise (reports no change in po intake, however, but she's  not a good historian) - DG Chest 2 View  4. Bronchiectasis without complication ([place]) -start on neb pulmonary ordered that she never used -flutter valve  Labs/tests ordered:   Orders Placed This Encounter  Procedures  . DG Chest 2 View    Order Specific Question:   Reason for Exam (SYMPTOM  OR DIAGNOSIS REQUIRED)    Answer:   left pleuritic pain, cough, elevated temp for her    Order Specific Question:   Preferred imaging location?    Answer:   [id number]   Next appt:  07/17/2017  Vira Chaplin L. Kaidance Pantoja, D.O. [facility] [address], [place] [address] Cell Phone (Mon-Fri 8am-5pm):  [phone] On Call:  [phone] & follow prompts after 5pm & weekends Office Phone:  [phone] Office Fax:  [phone]

## 2017-06-06 ENCOUNTER — Ambulatory Visit
Admission: RE | Admit: 2017-06-06 | Discharge: 2017-06-06 | Disposition: A | Discharge status: Home / Self Care | Payer: Medicare Other | Source: Ambulatory Visit | Attending: Internal Medicine | Admitting: Internal Medicine

## 2017-06-06 ENCOUNTER — Telehealth: Payer: Self-pay | Admitting: *Deleted

## 2017-06-06 DIAGNOSIS — R05 Cough: Secondary | ICD-10-CM | POA: Diagnosis not present

## 2017-06-06 MED ORDER — SACCHAROMYCES BOULARDII 250 MG PO CAPS: 250.0000 mg | ORAL_CAPSULE | Freq: Two times a day (BID) | ORAL | 0 refills | Status: DC

## 2017-06-06 MED ORDER — LEVOFLOXACIN 500 MG PO TABS: 500.0000 mg | ORAL_TABLET | Freq: Every day | ORAL | 0 refills | Status: DC

## 2017-06-06 NOTE — Telephone Encounter (Signed)
-----   Message from Gayland Curry, DO sent at 06/06/2017  3:35 PM EST ----- Pt appears to have pneumonia.  Let's start her on levaquin 500mg  po daily for 7 days for pneumonia.  She should also either eat yogurt daily OR take florastor 250mg  po bid to prevent yeast infections and diarrhea from the antibiotics.  There is a recommendation that we do a follow-up CT scan of her chest to make sure the area clears up--we can order this at her next schedule visit with me 07/17/17.

## 2017-06-06 NOTE — Telephone Encounter (Signed)
Spoke with daughter and advised results. rx sent to pharmacy by e-script  

## 2017-06-27 DIAGNOSIS — R350 Frequency of micturition: Secondary | ICD-10-CM | POA: Diagnosis not present

## 2017-06-27 DIAGNOSIS — R3914 Feeling of incomplete bladder emptying: Secondary | ICD-10-CM | POA: Diagnosis not present

## 2017-07-01 ENCOUNTER — Telehealth: Payer: Self-pay | Admitting: Internal Medicine

## 2017-07-01 NOTE — Telephone Encounter (Signed)
Left msg asking pt to call me at [phone] to schedule AWV w/ nurse Clarise Cruz) at [facility] clinic on 07/16/17. VDM (DD)

## 2017-07-16 ENCOUNTER — Non-Acute Institutional Stay: Payer: Medicare Other

## 2017-07-16 VITALS — BP 122/70 | HR 78 | Temp 97.5°F | Ht 68.0 in | Wt 148.0 lb

## 2017-07-16 DIAGNOSIS — Z0000 Encounter for general adult medical examination without abnormal findings: Secondary | ICD-10-CM

## 2017-07-16 DIAGNOSIS — Z23 Encounter for immunization: Secondary | ICD-10-CM

## 2017-07-16 NOTE — Patient Instructions (Signed)
Kaitlyn Ortiz , Thank you for taking time to come for your Medicare Wellness Visit. I appreciate your ongoing commitment to your health goals. Please review the following plan we discussed and let me know if I can assist you in the future.   Screening recommendations/referrals: Colonoscopy excluded, you are over age 82 Mammogram excluded, you are over age 61 Bone Density up to date Recommended yearly ophthalmology/optometry visit for glaucoma screening and checkup Recommended yearly dental visit for hygiene and checkup  Vaccinations: Influenza vaccine up to date, due 2019 fall season Pneumococcal vaccine 13 given today, up to date Tdap vaccine due, prescription sent to pharmacy Shingles vaccine due, prescription sent to pharmacy    Advanced directives: in chart  Conditions/risks identified: none  Next appointment: Dr. [name] 07/17/2017 @ 1:30pm   Preventive Care 65 Years and Older, Female Preventive care refers to lifestyle choices and visits with your health care provider that can promote health and wellness. What does preventive care include?  A yearly physical exam. This is also called an annual well check.  Dental exams once or twice a year.  Routine eye exams. Ask your health care provider how often you should have your eyes checked.  Personal lifestyle choices, including:  Daily care of your teeth and gums.  Regular physical activity.  Eating a healthy diet.  Avoiding tobacco and drug use.  Limiting alcohol use.  Practicing safe sex.  Taking low-dose aspirin every day.  Taking vitamin and mineral supplements as recommended by your health care provider. What happens during an annual well check? The services and screenings done by your health care provider during your annual well check will depend on your age, overall health, lifestyle risk factors, and family history of disease. Counseling  Your health care provider may ask you questions about your:  Alcohol  use.  Tobacco use.  Drug use.  Emotional well-being.  Home and relationship well-being.  Sexual activity.  Eating habits.  History of falls.  Memory and ability to understand (cognition).  Work and work Statistician.  Reproductive health. Screening  You may have the following tests or measurements:  Height, weight, and BMI.  Blood pressure.  Lipid and cholesterol levels. These may be checked every 5 years, or more frequently if you are over 40 years old.  Skin check.  Lung cancer screening. You may have this screening every year starting at age 88 if you have a 30-pack-year history of smoking and currently smoke or have quit within the past 15 years.  Fecal occult blood test (FOBT) of the stool. You may have this test every year starting at age 62.  Flexible sigmoidoscopy or colonoscopy. You may have a sigmoidoscopy every 5 years or a colonoscopy every 10 years starting at age 36.  Hepatitis C blood test.  Hepatitis B blood test.  Sexually transmitted disease (STD) testing.  Diabetes screening. This is done by checking your blood sugar (glucose) after you have not eaten for a while (fasting). You may have this done every 1-3 years.  Bone density scan. This is done to screen for osteoporosis. You may have this done starting at age 67.  Mammogram. This may be done every 1-2 years. Talk to your health care provider about how often you should have regular mammograms. Talk with your health care provider about your test results, treatment options, and if necessary, the need for more tests. Vaccines  Your health care provider may recommend certain vaccines, such as:  Influenza vaccine. This is recommended every  year.  Tetanus, diphtheria, and acellular pertussis (Tdap, Td) vaccine. You may need a Td booster every 10 years.  Zoster vaccine. You may need this after age 61.  Pneumococcal 13-valent conjugate (PCV13) vaccine. One dose is recommended after age  11.  Pneumococcal polysaccharide (PPSV23) vaccine. One dose is recommended after age 51. Talk to your health care provider about which screenings and vaccines you need and how often you need them. This information is not intended to replace advice given to you by your health care provider. Make sure you discuss any questions you have with your health care provider. Document Released: 05/13/2015 Document Revised: 01/04/2016 Document Reviewed: 02/15/2015 Elsevier Interactive Patient Education  2017 [place] Prevention in the Home Falls can cause injuries. They can happen to people of all ages. There are many things you can do to make your home safe and to help prevent falls. What can I do on the outside of my home?  Regularly fix the edges of walkways and driveways and fix any cracks.  Remove anything that might make you trip as you walk through a door, such as a raised step or threshold.  Trim any bushes or trees on the path to your home.  Use bright outdoor lighting.  Clear any walking paths of anything that might make someone trip, such as rocks or tools.  Regularly check to see if handrails are loose or broken. Make sure that both sides of any steps have handrails.  Any raised decks and porches should have guardrails on the edges.  Have any leaves, snow, or ice cleared regularly.  Use sand or salt on walking paths during winter.  Clean up any spills in your garage right away. This includes oil or grease spills. What can I do in the bathroom?  Use night lights.  Install grab bars by the toilet and in the tub and shower. Do not use towel bars as grab bars.  Use non-skid mats or decals in the tub or shower.  If you need to sit down in the shower, use a plastic, non-slip stool.  Keep the floor dry. Clean up any water that spills on the floor as soon as it happens.  Remove soap buildup in the tub or shower regularly.  Attach bath mats securely with double-sided  non-slip rug tape.  Do not have throw rugs and other things on the floor that can make you trip. What can I do in the bedroom?  Use night lights.  Make sure that you have a light by your bed that is easy to reach.  Do not use any sheets or blankets that are too big for your bed. They should not hang down onto the floor.  Have a firm chair that has side arms. You can use this for support while you get dressed.  Do not have throw rugs and other things on the floor that can make you trip. What can I do in the kitchen?  Clean up any spills right away.  Avoid walking on wet floors.  Keep items that you use a lot in easy-to-reach places.  If you need to reach something above you, use a strong step stool that has a grab bar.  Keep electrical cords out of the way.  Do not use floor polish or wax that makes floors slippery. If you must use wax, use non-skid floor wax.  Do not have throw rugs and other things on the floor that can make you trip. What can  I do with my stairs?  Do not leave any items on the stairs.  Make sure that there are handrails on both sides of the stairs and use them. Fix handrails that are broken or loose. Make sure that handrails are as long as the stairways.  Check any carpeting to make sure that it is firmly attached to the stairs. Fix any carpet that is loose or worn.  Avoid having throw rugs at the top or bottom of the stairs. If you do have throw rugs, attach them to the floor with carpet tape.  Make sure that you have a light switch at the top of the stairs and the bottom of the stairs. If you do not have them, ask someone to add them for you. What else can I do to help prevent falls?  Wear shoes that:  Do not have high heels.  Have rubber bottoms.  Are comfortable and fit you well.  Are closed at the toe. Do not wear sandals.  If you use a stepladder:  Make sure that it is fully opened. Do not climb a closed stepladder.  Make sure that both  sides of the stepladder are locked into place.  Ask someone to hold it for you, if possible.  Clearly mark and make sure that you can see:  Any grab bars or handrails.  First and last steps.  Where the edge of each step is.  Use tools that help you move around (mobility aids) if they are needed. These include:  Canes.  Walkers.  Scooters.  Crutches.  Turn on the lights when you go into a dark area. Replace any light bulbs as soon as they burn out.  Set up your furniture so you have a clear path. Avoid moving your furniture around.  If any of your floors are uneven, fix them.  If there are any pets around you, be aware of where they are.  Review your medicines with your doctor. Some medicines can make you feel dizzy. This can increase your chance of falling. Ask your doctor what other things that you can do to help prevent falls. This information is not intended to replace advice given to you by your health care provider. Make sure you discuss any questions you have with your health care provider. Document Released: 02/10/2009 Document Revised: 09/22/2015 Document Reviewed: 05/21/2014 Elsevier Interactive Patient Education  2017 [employer].

## 2017-07-16 NOTE — Progress Notes (Signed)
Subjective:   Kaitlyn Ortiz is a 84 y.o. female who presents for an Initial Medicare Annual Wellness Visit at [facility]        Objective:    There were no vitals filed for this visit. There is no height or weight on file to calculate BMI.  Advanced Directives 04/26/2017 01/23/2017  Does Patient Have a Medical Advance Directive? Yes Yes  Type of Paramedic of [place];Living will [place]  Does patient want to make changes to medical advance directive? No - Patient declined -  Copy of [place] in Chart? No - copy requested Yes    Current Medications (verified) Outpatient Encounter Medications as of 07/16/2017  Medication Sig  . amLODipine (NORVASC) 2.5 MG tablet Take 1 tablet (2.5 mg total) by mouth daily. Take with 5 mg to equal 7.5 mg  . amLODipine (NORVASC) 5 MG tablet Take 1 tablet (5 mg total) by mouth daily.  Marland Kitchen aspirin 81 MG chewable tablet Chew 1 tablet by mouth daily.  . Cholecalciferol (VITAMIN D3) 5000 units CAPS Take 1 capsule (5,000 Units total) by mouth daily.  Marland Kitchen donepezil (ARICEPT) 5 MG tablet Take 1 tablet (5 mg total) by mouth at bedtime.  . Glucosamine-Chondroit-Vit C-Mn (GLUCOSAMINE 1500 COMPLEX PO) Take 1-2 tablets by mouth daily.  . isosorbide mononitrate (IMDUR) 60 MG 24 hr tablet Take 1 tablet (60 mg total) by mouth daily.  Marland Kitchen latanoprost (XALATAN) 0.005 % ophthalmic solution Place 1 drop into both eyes at bedtime.  Marland Kitchen levofloxacin (LEVAQUIN) 500 MG tablet Take 1 tablet (500 mg total) by mouth daily.  . Lutein 6 MG CAPS Take 1 capsule by mouth daily.  . metoprolol succinate (TOPROL-XL) 25 MG 24 hr tablet Take 1 tablet (25 mg total) by mouth daily.  . Multiple Vitamin (MULTIVITAMIN) tablet Take 1 tablet by mouth daily.  . nitroGLYCERIN (NITROSTAT) 0.4 MG SL tablet Place 1 tablet (0.4 mg total) under the tongue every 5 (five) minutes as needed for chest pain (for 3 tablets  maximum).  . polyethylene glycol (MIRALAX / GLYCOLAX) packet Take 17 g by mouth daily as needed.  . pravastatin (PRAVACHOL) 40 MG tablet Take 1 tablet (40 mg total) by mouth daily.  Marland Kitchen saccharomyces boulardii (FLORASTOR) 250 MG capsule Take 1 capsule (250 mg total) by mouth 2 (two) times daily.  Marland Kitchen trimethoprim (TRIMPEX) 100 MG tablet Take 1 tablet (100 mg total) by mouth daily.   No facility-administered encounter medications on file as of 07/16/2017.     Allergies (verified) Patient has no known allergies.   History: Past Medical History:  Diagnosis Date  . Bronchiectasis ([place])   . Cognitive changes    mild  . Glaucoma   . Heart attack ([place])    mild  . Hypertension   . Incontinence    Past Surgical History:  Procedure Laterality Date  . CATARACT EXTRACTION  1994  . CATARACT EXTRACTION  2006   Family History  Problem Relation Age of Onset  . Stroke Father    Social History   Socioeconomic History  . Marital status: Single    Spouse name: Not on file  . Number of children: Not on file  . Years of education: Not on file  . Highest education level: Not on file  Social Needs  . Financial resource strain: Not on file  . Food insecurity - worry: Not on file  . Food insecurity - inability: Not on file  .  Transportation needs - medical: Not on file  . Transportation needs - non-medical: Not on file  Occupational History  . Not on file  Tobacco Use  . Smoking status: Never Smoker  . Smokeless tobacco: Never Used  Substance and Sexual Activity  . Alcohol use: No  . Drug use: No  . Sexual activity: Not Currently  Other Topics Concern  . Not on file  Social History Narrative   Social History      Diet? Lots of fruits and veggies      Do you drink/eat things with caffeine? no      Marital status?                            single        What year were you married? 1960      Do you live in a house, apartment, assisted living, condo, trailer, etc.? apartment       Is it one or more stories? one      How many persons live in your home? one      Do you have any pets in your home? (please list) no      Highest level of education completed? masters      Current or past profession: publishing      Advanced Directives      Do you exercise?           yes                           Type & how often? Walking- daily      Do you have a living will? yes      Do you have a DNR form?                                  If not, do you want to discuss one? no      Do you have signed POA/HPOA for forms? yes      Functional Status      Do you have difficulty bathing or dressing yourself? no      Do you have difficulty preparing food or eating?  no      Do you have difficulty managing your medications? no      Do you have difficulty managing your finances? no      Do you have difficulty affording your medications? no    Tobacco Counseling Counseling given: Not Answered   Clinical Intake:                        Activities of Daily Living No flowsheet data found.   Immunizations and Health Maintenance Immunization History  Administered Date(s) Administered  . Influenza, High Dose Seasonal PF 01/29/2016  . Influenza-Unspecified 02/27/2017   Health Maintenance Due  Topic Date Due  . TETANUS/TDAP  05/19/1950  . PNA vac Low Risk Adult (1 of 2 - PCV13) 05/19/1998    Patient Care Team: Gayland Curry, DO as PCP - General (Geriatric Medicine) Bjorn Loser, MD as Consulting Physician (Urology)  Indicate any recent Medical Services you may have received from other than [facility] providers in the past year (date may be approximate).     Assessment:   This is a routine wellness examination for Kaitlyn Ortiz.  Hearing/Vision screen No  exam data present  Dietary issues and exercise activities discussed:    Goals    None     Depression Screen PHQ 2/9 Scores 06/05/2017 04/10/2017 01/23/2017 12/26/2016  PHQ - 2 Score 0 0 0 0      Fall Risk Fall Risk  06/05/2017 04/10/2017 01/23/2017 12/26/2016  Falls in the past year? No No No Yes  Number falls in past yr: - - - 2 or more  Comment - - - 4-5  Injury with Fall? - - - No    Is the patient's home free of loose throw rugs in walkways, pet beds, electrical cords, etc?   yes      Grab bars in the bathroom? yes      Handrails on the stairs?   yes      Adequate lighting?   yes  Cognitive Function:        Screening Tests Health Maintenance  Topic Date Due  . TETANUS/TDAP  05/19/1950  . PNA vac Low Risk Adult (1 of 2 - PCV13) 05/19/1998  . INFLUENZA VACCINE  Completed  . DEXA SCAN  Completed    Qualifies for Shingles Vaccine? Yes, educated and prescription sent to pharmacy  Cancer Screenings: Lung: Low Dose CT Chest recommended if Age 29-80 years, 30 pack-year currently smoking OR have quit w/in 15years. Patient does not qualify. Breast: Up to date on Mammogram? Yes   Up to date of Bone Density/Dexa? Yes Colorectal: up to date  Additional Screenings:  Hepatitis C Screening: declined PNA 13 given today TDAP due-prescription sent to pharmacy     Plan:    I have personally reviewed and addressed the Medicare Annual Wellness questionnaire and have noted the following in the patient's chart:  A. Medical and social history B. Use of alcohol, tobacco or illicit drugs  C. Current medications and supplements D. Functional ability and status E.  Nutritional status F.  Physical activity G. Advance directives H. List of other physicians I.  Hospitalizations, surgeries, and ER visits in previous 12 months J.  [address] to include hearing, vision, cognitive, depression L. Referrals and appointments - none  In addition, I have reviewed and discussed with patient certain preventive protocols, quality metrics, and best practice recommendations. A written personalized care plan for preventive services as well as general preventive health recommendations  were provided to patient.  See attached scanned questionnaire for additional information.   Signed,   Tyson Dense, RN Nurse Health Advisor  Patient concerns:None

## 2017-07-17 ENCOUNTER — Non-Acute Institutional Stay: Payer: Medicare Other | Admitting: Internal Medicine

## 2017-07-17 ENCOUNTER — Encounter: Payer: Self-pay | Admitting: Internal Medicine

## 2017-07-17 VITALS — BP 138/72 | HR 67 | Temp 97.7°F | Wt 148.0 lb

## 2017-07-17 DIAGNOSIS — J479 Bronchiectasis, uncomplicated: Secondary | ICD-10-CM | POA: Diagnosis not present

## 2017-07-17 DIAGNOSIS — I252 Old myocardial infarction: Secondary | ICD-10-CM | POA: Diagnosis not present

## 2017-07-17 DIAGNOSIS — I712 Thoracic aortic aneurysm, without rupture, unspecified: Secondary | ICD-10-CM

## 2017-07-17 DIAGNOSIS — I2510 Atherosclerotic heart disease of native coronary artery without angina pectoris: Secondary | ICD-10-CM | POA: Diagnosis not present

## 2017-07-17 DIAGNOSIS — I10 Essential (primary) hypertension: Secondary | ICD-10-CM

## 2017-07-17 DIAGNOSIS — H409 Unspecified glaucoma: Secondary | ICD-10-CM | POA: Diagnosis not present

## 2017-07-17 DIAGNOSIS — J189 Pneumonia, unspecified organism: Secondary | ICD-10-CM | POA: Diagnosis not present

## 2017-07-17 DIAGNOSIS — F0390 Unspecified dementia without behavioral disturbance: Secondary | ICD-10-CM | POA: Diagnosis not present

## 2017-07-17 MED ORDER — DONEPEZIL HCL 10 MG PO TABS: 10.0000 mg | ORAL_TABLET | Freq: Every day | ORAL | 3 refills | Status: DC

## 2017-07-17 NOTE — Progress Notes (Signed)
Location:  Occupational psychologist of Service:  Clinic (12)  Provider: Josh Nicolosi L. [name], D.O., C.M.D.  Code Status: DNR Goals of Care:  Advanced Directives 07/17/2017  Does Patient Have a Medical Advance Directive? Yes  Type of Paramedic of [place];Living will  Does patient want to make changes to medical advance directive? No - Patient declined  Copy of [place] in Chart? Yes     Chief Complaint  Patient presents with  . Medical Management of Chronic Issues    62mth follow-up    HPI: Patient is a 84 y.o. female seen today for medical management of chronic diseases.    Reports she recovered from her pneumonia in about a week with antibiotics and nebulizers.  Oxygen came back up to normal range.  She is still using her "blow gadgets" morning and evening.  She likes the green one.  Not short of breath walking around.  Assisted living:  They want to discuss this.  Her daughter is asking who to call about moving her to AL.  Given [facility] Tessitore's contact information from social work.  [name] is wondering about a tour for them of assisted living.  Gets frustrated about her pills.  Clarise Cruz, RN, reviewed her medications with her at the [facility].  Pt doesn't understand her daughter's concern about getting her in there as a preventive move.  [name] does not want it be an emergent.  We discussed cognitive concerns and vision concerns being the reason I recommend assisted living for her.  She struggles with laundry, shopping. They are hard work.  She say those things take her all day long.  She has some novels she'd like to read and she enjoys attending committees--transportation and security, writer's group, and might join a support group at a [employer] for women with low vision.   She reports she will delegate the address changing to her daughter.    She's on trimethoprim for UTI prophylaxis at urology.  He's also recommended  myrbetriq and provided 25mg  daily.    KPN is wrong b/c pt has had prevnar and pneumovax along with flu in the past.  Past Medical History:  Diagnosis Date  . Bronchiectasis ([place])   . Cognitive changes    mild  . Glaucoma   . Heart attack ([place])    mild  . Hypertension   . Incontinence     Past Surgical History:  Procedure Laterality Date  . CATARACT EXTRACTION  1994  . CATARACT EXTRACTION  2006    No Known Allergies  Outpatient Encounter Medications as of 07/17/2017  Medication Sig  . amLODipine (NORVASC) 2.5 MG tablet Take 1 tablet (2.5 mg total) by mouth daily. Take with 5 mg to equal 7.5 mg  . amLODipine (NORVASC) 5 MG tablet Take 1 tablet (5 mg total) by mouth daily.  . Ascorbic Acid (VITAMIN C) 1000 MG tablet Take 1,000 mg by mouth daily.  Marland Kitchen aspirin 81 MG chewable tablet Chew 1 tablet by mouth daily.  . Cholecalciferol (VITAMIN D3) 5000 units CAPS Take 1 capsule (5,000 Units total) by mouth daily.  Marland Kitchen donepezil (ARICEPT) 10 MG tablet Take 1 tablet (10 mg total) by mouth at bedtime.  . Glucosamine-Chondroit-Vit C-Mn (GLUCOSAMINE 1500 COMPLEX PO) Take 1-2 tablets by mouth daily.  . isosorbide mononitrate (IMDUR) 60 MG 24 hr tablet Take 1 tablet (60 mg total) by mouth daily.  Marland Kitchen latanoprost (XALATAN) 0.005 % ophthalmic solution Place 1 drop into both  eyes at bedtime.  . Lutein 6 MG CAPS Take 1 capsule by mouth daily.  . metoprolol succinate (TOPROL-XL) 25 MG 24 hr tablet Take 1 tablet (25 mg total) by mouth daily.  . Multiple Vitamin (MULTIVITAMIN) tablet Take 1 tablet by mouth daily.  . polyethylene glycol (MIRALAX / GLYCOLAX) packet Take 17 g by mouth daily as needed.  . pravastatin (PRAVACHOL) 40 MG tablet Take 1 tablet (40 mg total) by mouth daily.  Marland Kitchen trimethoprim (TRIMPEX) 100 MG tablet Take 1 tablet (100 mg total) by mouth daily.  . [DISCONTINUED] donepezil (ARICEPT) 5 MG tablet Take 1 tablet (5 mg total) by mouth at bedtime.   No facility-administered encounter  medications on file as of 07/17/2017.     Review of Systems:  Review of Systems  Constitutional: Negative for chills, fever and malaise/fatigue.  HENT: Negative for congestion.   Eyes: Positive for blurred vision.  Respiratory: Negative for cough, shortness of breath and wheezing.   Cardiovascular: Negative for chest pain, palpitations and leg swelling.  Gastrointestinal: Negative for abdominal pain, blood in stool, constipation and melena.  Genitourinary: Negative for dysuria.  Musculoskeletal: Negative for falls.  Skin: Negative for itching and rash.  Neurological: Negative for dizziness and loss of consciousness.  Psychiatric/Behavioral: Positive for memory loss. Negative for depression. The patient is not nervous/anxious and does not have insomnia.     Health Maintenance  Topic Date Due  . TETANUS/TDAP  05/19/1950  . INFLUENZA VACCINE  Completed  . DEXA SCAN  Completed  . PNA vac Low Risk Adult  Completed    Physical Exam: Vitals:   07/17/17 1325  BP: 138/72  Pulse: 67  Temp: 97.7 F (36.5 C)  TempSrc: Oral  SpO2: 96%  Weight: 148 lb (67.1 kg)   Body mass index is 22.5 kg/m. Physical Exam  Constitutional: She is oriented to person, place, and time. She appears well-developed. No distress.  HENT:  Head: Normocephalic and atraumatic.  Eyes: Pupils are equal, round, and reactive to light. EOM are normal.  Glasses; poor vision  Cardiovascular: Normal rate, regular rhythm, normal heart sounds and intact distal pulses.  Pulmonary/Chest: Effort normal and breath sounds normal. No respiratory distress. She has no wheezes.  Abdominal: Bowel sounds are normal.  Musculoskeletal: Normal range of motion.  Slightly stooped posture  Neurological: She is alert and oriented to person, place, and time. No cranial nerve deficit. She exhibits normal muscle tone.  But poor historian, slow to respond, has some problems related to her vision and her cerebral cortex  Skin: Skin is warm  and dry. There is pallor.  Psychiatric:  Slightly flat affect    Labs reviewed: Basic Metabolic Panel: Recent Labs    01/01/17 0700  NA 141  K 3.9  BUN 24*  CREATININE 0.8  TSH 2.89   Liver Function Tests: Recent Labs    01/01/17 0700  AST 20  ALT 16  ALKPHOS 62   No results for input(s): LIPASE, AMYLASE in the last 8760 hours. No results for input(s): AMMONIA in the last 8760 hours. CBC: Recent Labs    01/01/17  WBC 5.8  HGB 12.6  HCT 39  PLT 178   Assessment/Plan 1. Thoracic aortic aneurysm without rupture ([place]) - needs f/u after 08/27/17, will place future order now - TAA was previously 4.2cm prior to her first visit with me which was august of last year - CT Chest W Contrast; Future  2. Bronchiectasis without complication ([place]) -had recent pneumonia -was not  using nebs as ordered prior to her pneumonia and had to be trained on use of machine and clinic nurse went over to administer regularly and supervisors on weekend -she is back off nebs, it appears 3. HCAP (healthcare-associated pneumonia) -resolved, reports being back to baseline with energy and mobility -cont use of flutter valve  4. Glaucoma, unspecified glaucoma type, unspecified laterality -progressive and affects ability to comprehend  5. Essential hypertension -bp well controlled with current regimen to prevent growth of aneurysm as best possible  6. Coronary artery disease with history of myocardial infarction without history of CABG -stable w/o problems -cont same med regimen above  8. Dementia without behavioral disturbance, unspecified dementia type -she requests to go up on her aricept so we'll try this - donepezil (ARICEPT) 10 MG tablet; Take 1 tablet (10 mg total) by mouth at bedtime.  Dispense: 90 tablet; Refill: 3 -needs increased help at home due to declining vision and cognitive abiities--is willing to look at AL now so arranged for her to get a tour with [facility]  Labs/tests  ordered:  No new Next appt:  11/20/2017 med mgt  Slayden Mennenga L. Niki Payment, D.O. [facility] [address],  [address] Cell Phone (Mon-Fri 8am-5pm):  [phone] On Call:  [phone] & follow prompts after 5pm & weekends Office Phone:  [phone] Office Fax:  [phone]

## 2017-07-23 DIAGNOSIS — L603 Nail dystrophy: Secondary | ICD-10-CM | POA: Diagnosis not present

## 2017-07-27 ENCOUNTER — Ambulatory Visit
Admission: RE | Admit: 2017-07-27 | Discharge: 2017-07-27 | Disposition: A | Discharge status: Home / Self Care | Payer: Medicare Other | Source: Ambulatory Visit | Attending: Internal Medicine | Admitting: Internal Medicine

## 2017-07-27 DIAGNOSIS — I712 Thoracic aortic aneurysm, without rupture, unspecified: Secondary | ICD-10-CM

## 2017-07-27 MED ORDER — IOPAMIDOL (ISOVUE-300) INJECTION 61%
75.0000 mL | Freq: Once | INTRAVENOUS | Status: AC | PRN
  Administered 2017-07-27: 75 mL via INTRAVENOUS

## 2017-08-07 ENCOUNTER — Non-Acute Institutional Stay: Payer: Medicare Other | Admitting: Internal Medicine

## 2017-08-07 ENCOUNTER — Encounter: Payer: Self-pay | Admitting: Internal Medicine

## 2017-08-07 VITALS — BP 120/80 | HR 60 | Temp 98.0°F | Ht 68.0 in | Wt 148.0 lb

## 2017-08-07 DIAGNOSIS — I712 Thoracic aortic aneurysm, without rupture, unspecified: Secondary | ICD-10-CM

## 2017-08-07 DIAGNOSIS — J479 Bronchiectasis, uncomplicated: Secondary | ICD-10-CM

## 2017-08-07 DIAGNOSIS — I252 Old myocardial infarction: Secondary | ICD-10-CM | POA: Diagnosis not present

## 2017-08-07 DIAGNOSIS — M7918 Myalgia, other site: Secondary | ICD-10-CM

## 2017-08-07 DIAGNOSIS — F0390 Unspecified dementia without behavioral disturbance: Secondary | ICD-10-CM

## 2017-08-07 DIAGNOSIS — R918 Other nonspecific abnormal finding of lung field: Secondary | ICD-10-CM

## 2017-08-07 DIAGNOSIS — M25562 Pain in left knee: Secondary | ICD-10-CM | POA: Insufficient documentation

## 2017-08-07 DIAGNOSIS — I2510 Atherosclerotic heart disease of native coronary artery without angina pectoris: Secondary | ICD-10-CM | POA: Diagnosis not present

## 2017-08-07 NOTE — Progress Notes (Signed)
Location:  Occupational psychologist of Service:  Clinic (12)  Provider: Kamarii Carton L. [name], D.O., C.M.D.  Code Status: DNR Goals of Care:  Advanced Directives 07/17/2017  Does Patient Have a Medical Advance Directive? Yes  Type of Paramedic of [place];Living will  Does patient want to make changes to medical advance directive? No - Patient declined  Copy of [place] in Chart? Yes     Chief Complaint  Patient presents with  . Follow-up    discuss CT results    HPI: Patient is a 84 y.o. female seen today to discuss CT results.  I had been under the impression that her aneurysm had grown from 4.3 to 4.5cm, but reviewing the old report today in care everywhere from [place], I see that it is the same size at 4.5 cm.  A referral to cardiothoracic surgery was recommended if she'd never been seen before--she has not.  She also had pulmonary nodules noted, but her last CT showed pulmonary nodules as well which were felt to be due to her bronchiectasis (labeled as tree-in-bud on novant study).        Had knee pain for 5 years.  Did PT, pain went away after that.  She's now had no pain in 5 years.  Left knee inferolateral aspect hurts and makes it hard to walk.  She is trying to exercise every day.  Pain gets to about a 4/10 when walking.  Does not radiate.  Has not taken any pain medication for it.  She saw orthopedics for it before.  She had an xray of it in the past, too and no arthritis was seen.  Back then, the pain was up to an 8/10 (5 yrs ago pre-PT).    Left arm movement posteriorly hurts over deltoid area like when she goes to put on a bra or find a narrow sleeve.  Says it's minor.  Past Medical History:  Diagnosis Date  . Bronchiectasis ([place])   . Cognitive changes    mild  . Glaucoma   . Heart attack ([place])    mild  . Hypertension   . Incontinence     Past Surgical History:  Procedure Laterality Date  . CATARACT  EXTRACTION  1994  . CATARACT EXTRACTION  2006    No Known Allergies  Outpatient Encounter Medications as of 08/07/2017  Medication Sig  . amLODipine (NORVASC) 2.5 MG tablet Take 1 tablet (2.5 mg total) by mouth daily. Take with 5 mg to equal 7.5 mg  . amLODipine (NORVASC) 5 MG tablet Take 1 tablet (5 mg total) by mouth daily.  . Ascorbic Acid (VITAMIN C) 1000 MG tablet Take 1,000 mg by mouth daily.  Marland Kitchen aspirin 81 MG chewable tablet Chew 1 tablet by mouth daily.  . Cholecalciferol (VITAMIN D3) 5000 units CAPS Take 1 capsule (5,000 Units total) by mouth daily.  Marland Kitchen donepezil (ARICEPT) 5 MG tablet Take 5 mg by mouth at bedtime.  . Glucosamine-Chondroit-Vit C-Mn (GLUCOSAMINE 1500 COMPLEX PO) Take 1-2 tablets by mouth daily.  . isosorbide mononitrate (IMDUR) 60 MG 24 hr tablet Take 1 tablet (60 mg total) by mouth daily.  Marland Kitchen latanoprost (XALATAN) 0.005 % ophthalmic solution Place 1 drop into both eyes at bedtime.  . Lutein 6 MG CAPS Take 1 capsule by mouth daily.  . metoprolol succinate (TOPROL-XL) 25 MG 24 hr tablet Take 1 tablet (25 mg total) by mouth daily.  . Multiple Vitamin (MULTIVITAMIN) tablet Take  1 tablet by mouth daily.  . polyethylene glycol (MIRALAX / GLYCOLAX) packet Take 17 g by mouth daily as needed.  . pravastatin (PRAVACHOL) 40 MG tablet Take 1 tablet (40 mg total) by mouth daily.  Marland Kitchen trimethoprim (TRIMPEX) 100 MG tablet Take 1 tablet (100 mg total) by mouth daily.  . [DISCONTINUED] donepezil (ARICEPT) 10 MG tablet Take 1 tablet (10 mg total) by mouth at bedtime.   No facility-administered encounter medications on file as of 08/07/2017.     Review of Systems:  Review of Systems  Constitutional: Negative for chills, fever, malaise/fatigue and weight loss.  Eyes:       Poor vision  Respiratory: Negative for shortness of breath.   Cardiovascular: Negative for chest pain, palpitations and leg swelling.  Gastrointestinal: Negative for abdominal pain.  Genitourinary: Positive for  urgency. Negative for dysuria.  Musculoskeletal: Positive for joint pain. Negative for falls.  Skin: Negative for itching and rash.  Neurological: Negative for dizziness and loss of consciousness.  Psychiatric/Behavioral:       Cognitive decline/visual agnosia issues, mild memory loss itself    Health Maintenance  Topic Date Due  . TETANUS/TDAP  05/19/1950  . INFLUENZA VACCINE  11/28/2017  . DEXA SCAN  Completed  . PNA vac Low Risk Adult  Completed    Physical Exam: Vitals:   08/07/17 0842  BP: 120/80  Pulse: 60  Temp: 98 F (36.7 C)  TempSrc: Oral  SpO2: 94%  Weight: 148 lb (67.1 kg)  Height: 5\' 8"  (1.727 m)   Body mass index is 22.5 kg/m. Physical Exam  Constitutional: She is oriented to person, place, and time. She appears well-developed. No distress.  Cardiovascular: Normal rate, regular rhythm and intact distal pulses.  Murmur heard. Pulmonary/Chest: Effort normal. No respiratory distress.  Scattered rhonchi that clear with cough  Abdominal: Bowel sounds are normal.  Musculoskeletal: Normal range of motion. She exhibits tenderness.  Of anterior knee beneath patella with extension of her left leg  Neurological: She is alert and oriented to person, place, and time.  But some processing difficulties, poor vision  Skin: Skin is warm and dry.  Psychiatric: She has a normal mood and affect.    Labs reviewed: Basic Metabolic Panel: Recent Labs    01/01/17 0700  NA 141  K 3.9  BUN 24*  CREATININE 0.8  TSH 2.89   Liver Function Tests: Recent Labs    01/01/17 0700  AST 20  ALT 16  ALKPHOS 62   No results for input(s): LIPASE, AMYLASE in the last 8760 hours. No results for input(s): AMMONIA in the last 8760 hours. CBC: Recent Labs    01/01/17  WBC 5.8  HGB 12.6  HCT 39  PLT 178   Lipid Panel: No results for input(s): CHOL, HDL, LDLCALC, TRIG, CHOLHDL, LDLDIRECT in the last 8760 hours. No results found for: HGBA1C  Procedures since last  visit: Ct Chest W Contrast  Result Date: 07/29/2017 CLINICAL DATA:  84 year old with thoracic aortic aneurysm without rupture. EXAM: CT CHEST WITH CONTRAST TECHNIQUE: Multidetector CT imaging of the chest was performed during intravenous contrast administration. Creatinine was obtained on site at [place] at [address] Results: Creatinine 0.9 mg/dL. CONTRAST:  35mL ISOVUE-300 IOPAMIDOL (ISOVUE-300) INJECTION 61% COMPARISON:  Chest radiograph 06/07/2007 FINDINGS: Cardiovascular: Coronary calcifications particularly at the LAD. Fusiform aneurysm of the mid ascending thoracic aorta measuring up to 4.5 cm. No evidence for an aortic dissection. The aortic root at the sinuses of Valsalva measures  roughly 3.6 cm. Distal ascending thoracic aorta measures 4.2 cm. Normal arch configuration. The great vessels are patent. Aortic arch measures up to 4.1 cm. Proximal descending thoracic aorta measures 3.1 cm. Distal descending thoracic aorta measures 2.6 cm. Mild atherosclerotic disease in the thoracic aorta. Main pulmonary artery is prominent for size measuring up to 3.8 cm. Limited evaluation of the pulmonary arterial tree due to the timing of the study. Heart size is mildly enlarged without pericardial fluid. Mediastinum/Nodes: Diffuse mild esophageal wall thickening is nonspecific. There is a 5 mm low density in the right thyroid lobe which is an incidental finding. Thyroid tissue is mildly heterogeneous. No significant lymph node enlargement in the mediastinum or hilar regions. No axillary lymphadenopathy. Lungs/Pleura: Trachea and mainstem bronchi are patent. No large pleural effusions. Bronchiectasis in the posterior right lower lobe with small endobronchial filling defects. 3 mm nodule at the right lung base on sequence 5, image 120. Scarring at the lung apices. Irregular nodular structure along the medial right upper lobe on sequence 5, image 20 measures 7 x 6 mm. Scattered foci of pleural  thickening throughout both lungs. Bronchiectasis in the right middle lobe. Scattered endobronchial filling defects in both lungs. Bronchiectasis at the lingula. Upper Abdomen: Hypodensities in liver are most compatible with cysts. There is a large hypodensity in the left kidney measuring up to 5.5 cm and suggestive for a cyst. Evidence for additional left renal cysts. No acute abnormality in the upper abdomen. Musculoskeletal: No acute bone abnormality. IMPRESSION: Thoracic aortic aneurysm. Ascending thoracic aorta measures up to 4.5 cm. Recommend semi-annual imaging followup by CTA or MRA and referral to cardiothoracic surgery if not already obtained. This recommendation follows 2010 ACCF/AHA/AATS/ACR/ASA/SCA/SCAI/SIR/STS/SVM Guidelines for the Diagnosis and Management of Patients With Thoracic Aortic Disease. Circulation. 2010; 121: D782-U235 Bronchiectasis with scattered nodules and endobronchial filling defects. Findings could represent areas of aspiration or mucus plugging. Indeterminate 7 mm nodular density in the medial right upper lobe that warrants follow-up. Non-contrast chest CT at 3-6 months is recommended. If the nodules are stable at time of repeat CT, then future CT at 18-24 months (from today's scan) is considered optional for low-risk patients, but is recommended for high-risk patients. This recommendation follows the consensus statement: Guidelines for Management of Incidental Pulmonary Nodules Detected on CT Images: From the Fleischner Society 2017; Radiology 2017; 284:228-243. Aortic Atherosclerosis (ICD10-I70.0). Hepatic and renal cysts. Electronically Signed   By: Markus Daft M.D.   On: 07/29/2017 09:38    Assessment/Plan 1. Thoracic aortic aneurysm without rupture [facility]) - Ambulatory referral to Cardiothoracic Surgery--for opinion of 4.5cm aneurysm; has never had a surgery consult for more insight into potential interventions, risks, benefits -pt's daughter should accompany her to appt due  to her cognitive processing difficulties  2. Pulmonary nodules/lesions, multiple -noted previously as part of her bronchiectasis, biannual CT recommended  3. Bronchiectasis without complication ([place]) -has nebs at home, but no longer actively using as exacerbation cleared   4. Dementia without behavioral disturbance, unspecified dementia type -with cognitive processing and visual agnosia issues rather than typical memory loss -encouraging more to AL level of care  5. Left lateral knee pain -has recurred after 5 years w/o it -got PT in past and after sessions completed, pain was resolved -referred to [facility] PT  6. Pain of left deltoid -patient does not want intervention on this just yet  Labs/tests ordered:  No new Next appt:  11/20/2017--keep regular appt  Callie Bunyard L. Wilhelmena Zea, D.O. [facility] [address], [place] [address] Cell Phone (Mon-Fri 8am-5pm):  [phone] On Call:  [phone] & follow prompts after 5pm & weekends Office Phone:  [phone] Office Fax:  [phone]

## 2017-08-08 DIAGNOSIS — N3942 Incontinence without sensory awareness: Secondary | ICD-10-CM | POA: Diagnosis not present

## 2017-08-08 DIAGNOSIS — R3914 Feeling of incomplete bladder emptying: Secondary | ICD-10-CM | POA: Diagnosis not present

## 2017-08-16 DIAGNOSIS — M6259 Muscle wasting and atrophy, not elsewhere classified, multiple sites: Secondary | ICD-10-CM | POA: Diagnosis not present

## 2017-08-16 DIAGNOSIS — M25562 Pain in left knee: Secondary | ICD-10-CM | POA: Diagnosis not present

## 2017-08-16 DIAGNOSIS — R278 Other lack of coordination: Secondary | ICD-10-CM | POA: Diagnosis not present

## 2017-08-16 DIAGNOSIS — R2681 Unsteadiness on feet: Secondary | ICD-10-CM | POA: Diagnosis not present

## 2017-08-19 ENCOUNTER — Other Ambulatory Visit: Payer: Self-pay | Admitting: Internal Medicine

## 2017-08-19 DIAGNOSIS — R2681 Unsteadiness on feet: Secondary | ICD-10-CM | POA: Diagnosis not present

## 2017-08-19 DIAGNOSIS — R278 Other lack of coordination: Secondary | ICD-10-CM | POA: Diagnosis not present

## 2017-08-19 DIAGNOSIS — M25562 Pain in left knee: Secondary | ICD-10-CM | POA: Diagnosis not present

## 2017-08-19 DIAGNOSIS — M6259 Muscle wasting and atrophy, not elsewhere classified, multiple sites: Secondary | ICD-10-CM | POA: Diagnosis not present

## 2017-08-21 ENCOUNTER — Other Ambulatory Visit: Payer: Self-pay | Admitting: *Deleted

## 2017-08-21 DIAGNOSIS — R2681 Unsteadiness on feet: Secondary | ICD-10-CM | POA: Diagnosis not present

## 2017-08-21 DIAGNOSIS — R278 Other lack of coordination: Secondary | ICD-10-CM | POA: Diagnosis not present

## 2017-08-21 DIAGNOSIS — M6259 Muscle wasting and atrophy, not elsewhere classified, multiple sites: Secondary | ICD-10-CM | POA: Diagnosis not present

## 2017-08-21 DIAGNOSIS — M25562 Pain in left knee: Secondary | ICD-10-CM | POA: Diagnosis not present

## 2017-08-21 MED ORDER — DONEPEZIL HCL 5 MG PO TABS: 5.0000 mg | ORAL_TABLET | Freq: Every day | ORAL | 1 refills | Status: DC

## 2017-08-21 NOTE — Telephone Encounter (Signed)
Received fax from [facility] stating Patient says her dose of Donepezil is back to 5mg  daily. Need new Rx.  Medication list reviewed and Rx sent to pharmacy.

## 2017-08-23 DIAGNOSIS — M6259 Muscle wasting and atrophy, not elsewhere classified, multiple sites: Secondary | ICD-10-CM | POA: Diagnosis not present

## 2017-08-23 DIAGNOSIS — R278 Other lack of coordination: Secondary | ICD-10-CM | POA: Diagnosis not present

## 2017-08-23 DIAGNOSIS — M25562 Pain in left knee: Secondary | ICD-10-CM | POA: Diagnosis not present

## 2017-08-23 DIAGNOSIS — R2681 Unsteadiness on feet: Secondary | ICD-10-CM | POA: Diagnosis not present

## 2017-08-26 DIAGNOSIS — R2681 Unsteadiness on feet: Secondary | ICD-10-CM | POA: Diagnosis not present

## 2017-08-26 DIAGNOSIS — M25562 Pain in left knee: Secondary | ICD-10-CM | POA: Diagnosis not present

## 2017-08-26 DIAGNOSIS — R278 Other lack of coordination: Secondary | ICD-10-CM | POA: Diagnosis not present

## 2017-08-26 DIAGNOSIS — M6259 Muscle wasting and atrophy, not elsewhere classified, multiple sites: Secondary | ICD-10-CM | POA: Diagnosis not present

## 2017-08-28 DIAGNOSIS — M25562 Pain in left knee: Secondary | ICD-10-CM | POA: Diagnosis not present

## 2017-08-28 DIAGNOSIS — Z9181 History of falling: Secondary | ICD-10-CM | POA: Diagnosis not present

## 2017-08-28 DIAGNOSIS — R278 Other lack of coordination: Secondary | ICD-10-CM | POA: Diagnosis not present

## 2017-08-28 DIAGNOSIS — R2681 Unsteadiness on feet: Secondary | ICD-10-CM | POA: Diagnosis not present

## 2017-08-28 DIAGNOSIS — R488 Other symbolic dysfunctions: Secondary | ICD-10-CM | POA: Diagnosis not present

## 2017-08-28 DIAGNOSIS — R2689 Other abnormalities of gait and mobility: Secondary | ICD-10-CM | POA: Diagnosis not present

## 2017-08-28 DIAGNOSIS — M6259 Muscle wasting and atrophy, not elsewhere classified, multiple sites: Secondary | ICD-10-CM | POA: Diagnosis not present

## 2017-08-28 DIAGNOSIS — G301 Alzheimer's disease with late onset: Secondary | ICD-10-CM | POA: Diagnosis not present

## 2017-08-29 ENCOUNTER — Other Ambulatory Visit: Payer: Self-pay | Admitting: Internal Medicine

## 2017-08-30 DIAGNOSIS — R488 Other symbolic dysfunctions: Secondary | ICD-10-CM | POA: Diagnosis not present

## 2017-08-30 DIAGNOSIS — G301 Alzheimer's disease with late onset: Secondary | ICD-10-CM | POA: Diagnosis not present

## 2017-08-30 DIAGNOSIS — R2689 Other abnormalities of gait and mobility: Secondary | ICD-10-CM | POA: Diagnosis not present

## 2017-08-30 DIAGNOSIS — R2681 Unsteadiness on feet: Secondary | ICD-10-CM | POA: Diagnosis not present

## 2017-08-30 DIAGNOSIS — M25562 Pain in left knee: Secondary | ICD-10-CM | POA: Diagnosis not present

## 2017-08-30 DIAGNOSIS — R278 Other lack of coordination: Secondary | ICD-10-CM | POA: Diagnosis not present

## 2017-09-02 DIAGNOSIS — R2689 Other abnormalities of gait and mobility: Secondary | ICD-10-CM | POA: Diagnosis not present

## 2017-09-02 DIAGNOSIS — R278 Other lack of coordination: Secondary | ICD-10-CM | POA: Diagnosis not present

## 2017-09-02 DIAGNOSIS — R488 Other symbolic dysfunctions: Secondary | ICD-10-CM | POA: Diagnosis not present

## 2017-09-02 DIAGNOSIS — M25562 Pain in left knee: Secondary | ICD-10-CM | POA: Diagnosis not present

## 2017-09-02 DIAGNOSIS — G301 Alzheimer's disease with late onset: Secondary | ICD-10-CM | POA: Diagnosis not present

## 2017-09-02 DIAGNOSIS — R2681 Unsteadiness on feet: Secondary | ICD-10-CM | POA: Diagnosis not present

## 2017-09-04 DIAGNOSIS — R278 Other lack of coordination: Secondary | ICD-10-CM | POA: Diagnosis not present

## 2017-09-04 DIAGNOSIS — M25562 Pain in left knee: Secondary | ICD-10-CM | POA: Diagnosis not present

## 2017-09-04 DIAGNOSIS — R2681 Unsteadiness on feet: Secondary | ICD-10-CM | POA: Diagnosis not present

## 2017-09-04 DIAGNOSIS — R488 Other symbolic dysfunctions: Secondary | ICD-10-CM | POA: Diagnosis not present

## 2017-09-04 DIAGNOSIS — G301 Alzheimer's disease with late onset: Secondary | ICD-10-CM | POA: Diagnosis not present

## 2017-09-04 DIAGNOSIS — R2689 Other abnormalities of gait and mobility: Secondary | ICD-10-CM | POA: Diagnosis not present

## 2017-09-05 ENCOUNTER — Other Ambulatory Visit: Payer: Self-pay | Admitting: Internal Medicine

## 2017-09-09 DIAGNOSIS — R2689 Other abnormalities of gait and mobility: Secondary | ICD-10-CM | POA: Diagnosis not present

## 2017-09-09 DIAGNOSIS — R488 Other symbolic dysfunctions: Secondary | ICD-10-CM | POA: Diagnosis not present

## 2017-09-09 DIAGNOSIS — M25562 Pain in left knee: Secondary | ICD-10-CM | POA: Diagnosis not present

## 2017-09-09 DIAGNOSIS — R2681 Unsteadiness on feet: Secondary | ICD-10-CM | POA: Diagnosis not present

## 2017-09-09 DIAGNOSIS — G301 Alzheimer's disease with late onset: Secondary | ICD-10-CM | POA: Diagnosis not present

## 2017-09-09 DIAGNOSIS — R278 Other lack of coordination: Secondary | ICD-10-CM | POA: Diagnosis not present

## 2017-09-10 ENCOUNTER — Other Ambulatory Visit: Payer: Self-pay

## 2017-09-10 ENCOUNTER — Encounter: Payer: Self-pay | Admitting: Thoracic Surgery (Cardiothoracic Vascular Surgery)

## 2017-09-10 ENCOUNTER — Institutional Professional Consult (permissible substitution) (INDEPENDENT_AMBULATORY_CARE_PROVIDER_SITE_OTHER): Payer: Medicare Other | Admitting: Thoracic Surgery (Cardiothoracic Vascular Surgery)

## 2017-09-10 VITALS — BP 106/69 | HR 73 | Resp 16 | Ht 67.5 in

## 2017-09-10 DIAGNOSIS — I2510 Atherosclerotic heart disease of native coronary artery without angina pectoris: Secondary | ICD-10-CM | POA: Diagnosis not present

## 2017-09-10 DIAGNOSIS — I252 Old myocardial infarction: Secondary | ICD-10-CM

## 2017-09-10 DIAGNOSIS — I712 Thoracic aortic aneurysm, without rupture, unspecified: Secondary | ICD-10-CM

## 2017-09-10 NOTE — Progress Notes (Signed)
PCP is Gayland Curry, DO Referring Provider is Gayland Curry, DO  Chief Complaint  Patient presents with  . TAA    new referral with CT CHEST 07/27/17...ECHO 10/01/16    HPI: [name] is sent for consultation regarding an ascending aneurysm  [name] is an 84 year old woman with a past medical history significant for dementia, bronchiectasis, coronary artery disease, heart attack, hypertension, glaucoma, and incontinence.  She had a chest CT last year.  She is a poor historian and does not know why it was done.  Her daughter thinks it has some to do with her bronchiectasis.  In any event showed a reported 4.2 cm aneurysm.  That scan was not done in our system.  She recently saw Dr. [name] who recommended a repeat scan.  She has not been having any chest pain, tightness, or pressure.  She denies shortness of breath.  She is not particularly active.  Past Medical History:  Diagnosis Date  . Bronchiectasis ([place])   . Cognitive changes    mild  . Glaucoma   . Heart attack ([place])    mild  . Hypertension   . Incontinence     Past Surgical History:  Procedure Laterality Date  . CATARACT EXTRACTION  1994  . CATARACT EXTRACTION  2006    Family History  Problem Relation Age of Onset  . Stroke Father     Social History Social History   Tobacco Use  . Smoking status: Never Smoker  . Smokeless tobacco: Never Used  Substance Use Topics  . Alcohol use: No  . Drug use: No    Current Outpatient Medications  Medication Sig Dispense Refill  . amLODipine (NORVASC) 2.5 MG tablet Take 1 tablet (2.5 mg total) by mouth daily. Take with 5 mg to equal 7.5 mg 90 tablet 1  . amLODipine (NORVASC) 5 MG tablet TAKE 1 TABLET ONCE DAILY. 90 tablet 1  . Ascorbic Acid (VITAMIN C) 1000 MG tablet Take 1,000 mg by mouth daily.    Marland Kitchen aspirin 81 MG chewable tablet Chew 1 tablet by mouth daily.    . Cholecalciferol (VITAMIN D3) 5000 units CAPS Take 1 capsule (5,000 Units total) by mouth daily. 30  capsule 5  . donepezil (ARICEPT) 5 MG tablet Take 1 tablet (5 mg total) by mouth at bedtime. 90 tablet 1  . Glucosamine-Chondroit-Vit C-Mn (GLUCOSAMINE 1500 COMPLEX PO) Take 1-2 tablets by mouth daily.    . isosorbide mononitrate (IMDUR) 60 MG 24 hr tablet Take 1 tablet (60 mg total) by mouth daily. 90 tablet 1  . latanoprost (XALATAN) 0.005 % ophthalmic solution Place 1 drop into both eyes at bedtime.    . Lutein 6 MG CAPS Take 1 capsule by mouth daily.    . metoprolol succinate (TOPROL-XL) 25 MG 24 hr tablet TAKE 1 TABLET ONCE DAILY. 90 tablet 0  . Multiple Vitamin (MULTIVITAMIN) tablet Take 1 tablet by mouth daily.    . polyethylene glycol (MIRALAX / GLYCOLAX) packet Take 17 g by mouth daily as needed.    . pravastatin (PRAVACHOL) 40 MG tablet TAKE 1 TABLET ONCE DAILY. 90 tablet 0  . trimethoprim (TRIMPEX) 100 MG tablet Take 1 tablet (100 mg total) by mouth daily. 45 tablet 0   No current facility-administered medications for this visit.     No Known Allergies  Review of Systems  Constitutional: Negative for activity change and appetite change.  HENT: Positive for hearing loss. Negative for trouble swallowing and voice change.  Eyes: Positive for visual disturbance.  Respiratory: Positive for cough. Negative for shortness of breath.   Cardiovascular: Negative for chest pain and leg swelling.  Musculoskeletal: Positive for arthralgias.       Leg cramps and pain in legs with walking  Neurological: Negative for seizures.       Processing issues    BP 106/69 (BP Location: Right Arm, Patient Position: Sitting, Cuff Size: Normal)   Pulse 73   Resp 16   Ht 5' 7.5" (1.715 m)   SpO2 96% Comment: ON RA  BMI 22.84 kg/m  Physical Exam  Constitutional: She is oriented to person, place, and time. No distress.  Elderly, frail-appearing  HENT:  Head: Normocephalic and atraumatic.  Mouth/Throat: No oropharyngeal exudate.  Eyes: Conjunctivae and EOM are normal. No scleral icterus.  Neck:   No carotid bruits  Cardiovascular: Normal rate and regular rhythm.  No murmur heard. Pulmonary/Chest: Effort normal and breath sounds normal. No respiratory distress. She has no wheezes.  Abdominal: Soft. She exhibits no distension. There is no tenderness.  Musculoskeletal: She exhibits no edema.  Lymphadenopathy:    She has no cervical adenopathy.  Neurological: She is alert and oriented to person, place, and time. She exhibits normal muscle tone. Coordination abnormal.  Slow psychomotor response  Skin: Skin is warm and dry.  Vitals reviewed.    Diagnostic Tests: CT CHEST WITH CONTRAST  TECHNIQUE: Multidetector CT imaging of the chest was performed during intravenous contrast administration.  Creatinine was obtained on site at [place] at [address]  Results: Creatinine 0.9 mg/dL.  CONTRAST:  76mL ISOVUE-300 IOPAMIDOL (ISOVUE-300) INJECTION 61%  COMPARISON:  Chest radiograph 06/07/2007  FINDINGS: Cardiovascular: Coronary calcifications particularly at the LAD. Fusiform aneurysm of the mid ascending thoracic aorta measuring up to 4.5 cm. No evidence for an aortic dissection. The aortic root at the sinuses of Valsalva measures roughly 3.6 cm. Distal ascending thoracic aorta measures 4.2 cm. Normal arch configuration. The great vessels are patent. Aortic arch measures up to 4.1 cm. Proximal descending thoracic aorta measures 3.1 cm. Distal descending thoracic aorta measures 2.6 cm. Mild atherosclerotic disease in the thoracic aorta. Main pulmonary artery is prominent for size measuring up to 3.8 cm. Limited evaluation of the pulmonary arterial tree due to the timing of the study. Heart size is mildly enlarged without pericardial fluid.  Mediastinum/Nodes: Diffuse mild esophageal wall thickening is nonspecific. There is a 5 mm low density in the right thyroid lobe which is an incidental finding. Thyroid tissue is mildly heterogeneous. No  significant lymph node enlargement in the mediastinum or hilar regions. No axillary lymphadenopathy.  Lungs/Pleura: Trachea and mainstem bronchi are patent. No large pleural effusions. Bronchiectasis in the posterior right lower lobe with small endobronchial filling defects. 3 mm nodule at the right lung base on sequence 5, image 120. Scarring at the lung apices. Irregular nodular structure along the medial right upper lobe on sequence 5, image 20 measures 7 x 6 mm. Scattered foci of pleural thickening throughout both lungs. Bronchiectasis in the right middle lobe. Scattered endobronchial filling defects in both lungs. Bronchiectasis at the lingula.  Upper Abdomen: Hypodensities in liver are most compatible with cysts. There is a large hypodensity in the left kidney measuring up to 5.5 cm and suggestive for a cyst. Evidence for additional left renal cysts. No acute abnormality in the upper abdomen.  Musculoskeletal: No acute bone abnormality.  IMPRESSION: Thoracic aortic aneurysm. Ascending thoracic aorta measures up to 4.5 cm. Recommend semi-annual  imaging followup by CTA or MRA and referral to cardiothoracic surgery if not already obtained. This recommendation follows 2010 ACCF/AHA/AATS/ACR/ASA/SCA/SCAI/SIR/STS/SVM Guidelines for the Diagnosis and Management of Patients With Thoracic Aortic Disease. Circulation. 2010; 121: B017-P102  Bronchiectasis with scattered nodules and endobronchial filling defects. Findings could represent areas of aspiration or mucus plugging. Indeterminate 7 mm nodular density in the medial right upper lobe that warrants follow-up. Non-contrast chest CT at 3-6 months is recommended. If the nodules are stable at time of repeat CT, then future CT at 18-24 months (from today's scan) is considered optional for low-risk patients, but is recommended for high-risk patients. This recommendation follows the consensus statement: Guidelines for Management of  Incidental Pulmonary Nodules Detected on CT Images: From the Fleischner Society 2017; Radiology 2017; 284:228-243.  Aortic Atherosclerosis (ICD10-I70.0).  Hepatic and renal cysts.   Electronically Signed   By: Markus Daft M.D.   On: 07/29/2017 09:38 I personally reviewed the CT images and concur with the findings noted above  Impression: [name] is an 84 year old woman who was incidentally noted to have an ascending aneurysm on the CT scan about a year ago.  Recent CT reported the diameter to be 4.5 cm.  Unfortunately her CT a year ago is not in our system and I cannot compare the 2 side-by-side.  There is a good possibility that there is not been that much of a change over the past year, but even if there had been that would not be an indication for surgery.  Indications for surgery include an increase in size of 5 mm over 6 months or a diameter of 5.5 cm.  They understand is very little chance of rupture in the setting.  The primary concern is dissection.  That is relatively rare with aneurysms this size.  I had a long discussion with her and her daughter regarding the ascending aneurysm.  I do not think she would be a candidate for surgical repair.  I want them to think about that prior to possible need for an emergency procedure.  If they ultimately decided that she would not have surgery there is no reason to continue with CT follow-up.  For now we will plan to rescan in 6 months for the lung nodule anyway.  Hypertension-blood pressure well controlled.  Blood pressure control is the key to medical management of the aneurysm.  Lung nodule-7 mm right upper lobe lung nodule.  She would not be a surgical candidate but could be potentially be a candidate for stereotactic radiation should the nodule grow.  This may just be inspissated mucus or related in some other way to her bronchiectasis.  We will plan to repeat her scan in 75months to evaluate that.  Plan: Return in 6 months for  follow-up of right upper lobe lung nodule.  Also recheck the aneurysm at that time  Melrose Nakayama, MD [facility] [phone]

## 2017-09-11 DIAGNOSIS — G301 Alzheimer's disease with late onset: Secondary | ICD-10-CM | POA: Diagnosis not present

## 2017-09-11 DIAGNOSIS — R2689 Other abnormalities of gait and mobility: Secondary | ICD-10-CM | POA: Diagnosis not present

## 2017-09-11 DIAGNOSIS — R278 Other lack of coordination: Secondary | ICD-10-CM | POA: Diagnosis not present

## 2017-09-11 DIAGNOSIS — M25562 Pain in left knee: Secondary | ICD-10-CM | POA: Diagnosis not present

## 2017-09-11 DIAGNOSIS — R2681 Unsteadiness on feet: Secondary | ICD-10-CM | POA: Diagnosis not present

## 2017-09-11 DIAGNOSIS — R488 Other symbolic dysfunctions: Secondary | ICD-10-CM | POA: Diagnosis not present

## 2017-09-20 DIAGNOSIS — R3914 Feeling of incomplete bladder emptying: Secondary | ICD-10-CM | POA: Diagnosis not present

## 2017-09-20 DIAGNOSIS — N3946 Mixed incontinence: Secondary | ICD-10-CM | POA: Diagnosis not present

## 2017-09-20 DIAGNOSIS — N3942 Incontinence without sensory awareness: Secondary | ICD-10-CM | POA: Diagnosis not present

## 2017-10-01 ENCOUNTER — Non-Acute Institutional Stay: Payer: Medicare Other | Admitting: Internal Medicine

## 2017-10-01 ENCOUNTER — Encounter: Payer: Self-pay | Admitting: Internal Medicine

## 2017-10-01 DIAGNOSIS — N3281 Overactive bladder: Secondary | ICD-10-CM

## 2017-10-01 DIAGNOSIS — Z7189 Other specified counseling: Secondary | ICD-10-CM | POA: Diagnosis not present

## 2017-10-01 DIAGNOSIS — I712 Thoracic aortic aneurysm, without rupture, unspecified: Secondary | ICD-10-CM

## 2017-10-01 DIAGNOSIS — J479 Bronchiectasis, uncomplicated: Secondary | ICD-10-CM

## 2017-10-01 DIAGNOSIS — H401130 Primary open-angle glaucoma, bilateral, stage unspecified: Secondary | ICD-10-CM

## 2017-10-01 DIAGNOSIS — M546 Pain in thoracic spine: Secondary | ICD-10-CM | POA: Diagnosis not present

## 2017-10-01 DIAGNOSIS — M545 Low back pain: Secondary | ICD-10-CM | POA: Diagnosis not present

## 2017-10-01 DIAGNOSIS — W19XXXA Unspecified fall, initial encounter: Secondary | ICD-10-CM | POA: Diagnosis not present

## 2017-10-01 DIAGNOSIS — N390 Urinary tract infection, site not specified: Secondary | ICD-10-CM | POA: Diagnosis not present

## 2017-10-01 DIAGNOSIS — F0390 Unspecified dementia without behavioral disturbance: Secondary | ICD-10-CM

## 2017-10-01 DIAGNOSIS — R918 Other nonspecific abnormal finding of lung field: Secondary | ICD-10-CM

## 2017-10-01 NOTE — Progress Notes (Signed)
Patient ID: Kaitlyn Ortiz, female   DOB: 1931/10/17, 84 y.o.   MRN: [id number]  Provider:  Rexene Edison. [name], D.O., C.M.D. Location:  [facility] Room Number: 650 Place of Service:  ALF (13)  PCP: Gayland Curry, DO Patient Care Team: Gayland Curry, DO as PCP - General (Geriatric Medicine) Bjorn Loser, MD as Consulting Physician (Urology)  Extended Emergency Contact Information Primary Emergency Contact: [facility] Address: [address]          [place], [place] [address] [name] of [place] Phone: [phone] Work Phone: [phone] Mobile Phone: [phone] Relation: Daughter  Code Status: DNR--reviewed again today--pt is clear about this  Goals of Care: Advanced Directive information Advanced Directives 10/01/2017  Does Patient Have a Medical Advance Directive? Yes  Type of Advance Directive [place]  Does patient want to make changes to medical advance directive? No - Patient declined  Copy of [place] in Chart? Yes  She also mentions that if she had pneumonia, she would only want treatment if it made her feel better, not to prolong her life.  We will need to complete her MOST form at her visit.  I believe I may have already started this discussion with her and her daughter, [name], in clinic.  Chief Complaint  Patient presents with  . New Admit To SNF    Assisted Living admission    HPI: Patient is a 84 y.o. female with a h/o bronchiectasis, glaucoma, htn, abdominal aortic aneurysm (last 4.5cm), constipation, hyperlipidemia, htn, overactive bladder and recurrent UTIs seen today for admission to assisted living from independent living due to progressing dementia.  She suffers from significant cognitive and visual losses (glaucoma on drops).  She was getting lost throughout the [place] community.    She's also had some falls.  [place] by [place]  who helped her arrange her apt and labeled items making things easier.   She also was recently seen by PT for balance.  She had great challenges when she developed a bronchiectasis exacerbation and required antibiotics and neb treatments [place].   She just suffered a fall overnight last night and was found to be orthostatic then.  Her metoprolol and amlodipine were held and bps remain fair with sitting 140/88 to standing of 122/80 (not meeting orthostatic parameters anymore).  She is no longer dizzy.    Since we last met, she did have a vascular evaluation by Dr. Roxan Hockey with the conclusion that she will not have any surgery for her aneurysm and that she still does need a f/u CT chest for the pulmonary nodule seen.    She also has been seeing Dr. Matilde Sprang for her OAB.  She is now on trimethoprim to prevent UTIs which she reports has been effective thus far.  She is going to participate in the PTNS pretibial nerve stimulation to see if it will decrease her frequency of urination.  It is a weekly session.    We don't have a tdap on her records so she will need this.  Past Medical History:  Diagnosis Date  . Bronchiectasis ([employer])   . Cognitive changes    mild  . Glaucoma   . Heart attack ([place])    mild  . Hypertension   . Incontinence    Past Surgical History:  Procedure Laterality Date  . CATARACT EXTRACTION  1994  . CATARACT EXTRACTION  2006    reports that she  has never smoked. She has never used smokeless tobacco. She reports that she does not drink alcohol or use drugs. Social History   Socioeconomic History  . Marital status: Single    Spouse name: Not on file  . Number of children: Not on file  . Years of education: Not on file  . Highest education level: Not on file  Occupational History  . Not on file  Social Needs  . Financial resource strain: Not hard at all  . Food insecurity:    Worry: Never true    Inability: Never true  . Transportation needs:     Medical: No    Non-medical: No  Tobacco Use  . Smoking status: Never Smoker  . Smokeless tobacco: Never Used  Substance and Sexual Activity  . Alcohol use: No  . Drug use: No  . Sexual activity: Not Currently  Lifestyle  . Physical activity:    Days per week: 0 days    Minutes per session: 0 min  . Stress: Only a little  Relationships  . Social connections:    Talks on phone: Twice a week    Gets together: Twice a week    Attends religious service: Never    Active member of club or organization: No    Attends meetings of clubs or organizations: Never    Relationship status: Never married  . Intimate partner violence:    Fear of current or ex partner: No    Emotionally abused: No    Physically abused: No    Forced sexual activity: No  Other Topics Concern  . Not on file  Social History Narrative   Social History      Diet? Lots of fruits and veggies      Do you drink/eat things with caffeine? no      Marital status?                            single        What year were you married? 1960      Do you live in a house, apartment, assisted living, condo, trailer, etc.? apartment      Is it one or more stories? one      How many persons live in your home? one      Do you have any pets in your home? (please list) no      Highest level of education completed? masters      Current or past profession: publishing      Advanced Directives      Do you exercise?           yes                           Type & how often? Walking- daily      Do you have a living will? yes      Do you have a DNR form?                                  If not, do you want to discuss one? no      Do you have signed POA/HPOA for forms? yes      Functional Status      Do you have difficulty bathing or dressing yourself? no      Do  you have difficulty preparing food or eating?  no      Do you have difficulty managing your medications? no      Do you have difficulty managing your finances?  no      Do you have difficulty affording your medications? no    Functional Status Survey:    Family History  Problem Relation Age of Onset  . Stroke Father     Health Maintenance  Topic Date Due  . TETANUS/TDAP  05/19/1952  . INFLUENZA VACCINE  11/28/2017  . DEXA SCAN  Completed  . PNA vac Low Risk Adult  Completed    No Known Allergies  Outpatient Encounter Medications as of 10/01/2017  Medication Sig  . Ascorbic Acid (VITAMIN C) 1000 MG tablet Take 1,000 mg by mouth daily.  Marland Kitchen aspirin 81 MG chewable tablet Chew 1 tablet by mouth daily.  . Cholecalciferol (VITAMIN D3) 5000 units CAPS Take 1 capsule (5,000 Units total) by mouth daily.  Marland Kitchen donepezil (ARICEPT) 5 MG tablet Take 1 tablet (5 mg total) by mouth at bedtime.  . Glucosamine-Chondroit-Vit C-Mn (GLUCOSAMINE 1500 COMPLEX PO) Take 1-2 tablets by mouth daily.  . isosorbide mononitrate (IMDUR) 60 MG 24 hr tablet Take 1 tablet (60 mg total) by mouth daily.  Marland Kitchen latanoprost (XALATAN) 0.005 % ophthalmic solution Place 1 drop into both eyes at bedtime.  . Lutein 6 MG CAPS Take 1 capsule by mouth daily.  . Multiple Vitamin (MULTIVITAMIN) tablet Take 1 tablet by mouth daily.  . polyethylene glycol (MIRALAX / GLYCOLAX) packet Take 17 g by mouth daily as needed.  . pravastatin (PRAVACHOL) 40 MG tablet TAKE 1 TABLET ONCE DAILY.  Marland Kitchen trimethoprim (TRIMPEX) 100 MG tablet Take 1 tablet (100 mg total) by mouth daily.  Marland Kitchen amLODipine (NORVASC) 2.5 MG tablet Take 1 tablet (2.5 mg total) by mouth daily. Take with 5 mg to equal 7.5 mg  . amLODipine (NORVASC) 5 MG tablet TAKE 1 TABLET ONCE DAILY.  . metoprolol succinate (TOPROL-XL) 25 MG 24 hr tablet TAKE 1 TABLET ONCE DAILY.   No facility-administered encounter medications on file as of 10/01/2017.     Review of Systems  Constitutional: Negative for activity change, appetite change, chills and fever.  HENT: Negative for congestion and trouble swallowing.   Eyes: Positive for visual disturbance.         Poor vision; glasses  Respiratory: Negative for cough, chest tightness and shortness of breath.   Cardiovascular: Negative for chest pain, palpitations and leg swelling.  Gastrointestinal: Negative for abdominal pain, constipation, diarrhea, nausea and vomiting.  Genitourinary: Positive for frequency and urgency. Negative for dysuria and hematuria.  Musculoskeletal: Positive for back pain and gait problem. Negative for arthralgias and joint swelling.  Skin: Negative for color change.  Neurological: Positive for dizziness. Negative for weakness.  Hematological: Bruises/bleeds easily.  Psychiatric/Behavioral: Positive for confusion. Negative for agitation, behavioral problems, hallucinations, sleep disturbance and suicidal ideas. The patient is not nervous/anxious.     Vitals:   10/01/17 1048  BP: 124/78  Pulse: 75  Resp: 18  Temp: 98.3 F (36.8 C)  TempSrc: Oral  SpO2: 98%  Weight: 148 lb (67.1 kg)  Height: 5' 7.5" (1.715 m)   Body mass index is 22.84 kg/m. Physical Exam  Constitutional: She is oriented to person, place, and time. No distress.  HENT:  Head: Normocephalic and atraumatic.  Right Ear: External ear normal.  Left Ear: External ear normal.  Nose: Nose normal.  Mouth/Throat: Oropharynx is clear and moist.  No oropharyngeal exudate.  Eyes: Pupils are equal, round, and reactive to light. Conjunctivae and EOM are normal.  glasses  Neck: Neck supple. No JVD present. No tracheal deviation present. No thyromegaly present.  Cardiovascular: Normal rate, regular rhythm, normal heart sounds and intact distal pulses.  Pulmonary/Chest: Effort normal and breath sounds normal. No respiratory distress.  Abdominal: Soft. Bowel sounds are normal. She exhibits no distension. There is no tenderness.  Musculoskeletal: Normal range of motion. She exhibits tenderness.  Over lower thoracic and upper lumbar vertebrae  Lymphadenopathy:    She has no cervical adenopathy.   Neurological: She is alert and oriented to person, place, and time.  But easily lost, more difficulty with other cognitive functions than memory alone  Skin: Skin is warm and dry. Capillary refill takes less than 2 seconds.  Psychiatric: She has a normal mood and affect.    Labs reviewed: Basic Metabolic Panel: Recent Labs    01/01/17 0700  NA 141  K 3.9  BUN 24*  CREATININE 0.8   Liver Function Tests: Recent Labs    01/01/17 0700  AST 20  ALT 16  ALKPHOS 62   No results for input(s): LIPASE, AMYLASE in the last 8760 hours. No results for input(s): AMMONIA in the last 8760 hours. CBC: Recent Labs    01/01/17  WBC 5.8  HGB 12.6  HCT 39  PLT 178   Cardiac Enzymes: No results for input(s): CKTOTAL, CKMB, CKMBINDEX, TROPONINI in the last 8760 hours. BNP: Invalid input(s): POCBNP No results found for: HGBA1C Lab Results  Component Value Date   TSH 2.89 01/01/2017   Lab Results  Component Value Date   VITAMINB12 550 01/01/2017   No results found for: FOLATE No results found for: IRON, TIBC, FERRITIN  Imaging and Procedures obtained prior to SNF admission: Ct Chest W Contrast  Result Date: 07/29/2017 CLINICAL DATA:  84 year old with thoracic aortic aneurysm without rupture. EXAM: CT CHEST WITH CONTRAST TECHNIQUE: Multidetector CT imaging of the chest was performed during intravenous contrast administration. Creatinine was obtained on site at [facility] at [address] Results: Creatinine 0.9 mg/dL. CONTRAST:  61m ISOVUE-300 IOPAMIDOL (ISOVUE-300) INJECTION 61% COMPARISON:  Chest radiograph 06/07/2007 FINDINGS: Cardiovascular: Coronary calcifications particularly at the LAD. Fusiform aneurysm of the mid ascending thoracic aorta measuring up to 4.5 cm. No evidence for an aortic dissection. The aortic root at the sinuses of Valsalva measures roughly 3.6 cm. Distal ascending thoracic aorta measures 4.2 cm. Normal arch configuration. The great vessels are  patent. Aortic arch measures up to 4.1 cm. Proximal descending thoracic aorta measures 3.1 cm. Distal descending thoracic aorta measures 2.6 cm. Mild atherosclerotic disease in the thoracic aorta. Main pulmonary artery is prominent for size measuring up to 3.8 cm. Limited evaluation of the pulmonary arterial tree due to the timing of the study. Heart size is mildly enlarged without pericardial fluid. Mediastinum/Nodes: Diffuse mild esophageal wall thickening is nonspecific. There is a 5 mm low density in the right thyroid lobe which is an incidental finding. Thyroid tissue is mildly heterogeneous. No significant lymph node enlargement in the mediastinum or hilar regions. No axillary lymphadenopathy. Lungs/Pleura: Trachea and mainstem bronchi are patent. No large pleural effusions. Bronchiectasis in the posterior right lower lobe with small endobronchial filling defects. 3 mm nodule at the right lung base on sequence 5, image 120. Scarring at the lung apices. Irregular nodular structure along the medial right upper lobe on sequence 5, image 20 measures 7 x 6 mm. Scattered  foci of pleural thickening throughout both lungs. Bronchiectasis in the right middle lobe. Scattered endobronchial filling defects in both lungs. Bronchiectasis at the lingula. Upper Abdomen: Hypodensities in liver are most compatible with cysts. There is a large hypodensity in the left kidney measuring up to 5.5 cm and suggestive for a cyst. Evidence for additional left renal cysts. No acute abnormality in the upper abdomen. Musculoskeletal: No acute bone abnormality. IMPRESSION: Thoracic aortic aneurysm. Ascending thoracic aorta measures up to 4.5 cm. Recommend semi-annual imaging followup by CTA or MRA and referral to cardiothoracic surgery if not already obtained. This recommendation follows 2010 ACCF/AHA/AATS/ACR/ASA/SCA/SCAI/SIR/STS/SVM Guidelines for the Diagnosis and Management of Patients With Thoracic Aortic Disease. Circulation. 2010;  121: J673-A193 Bronchiectasis with scattered nodules and endobronchial filling defects. Findings could represent areas of aspiration or mucus plugging. Indeterminate 7 mm nodular density in the medial right upper lobe that warrants follow-up. Non-contrast chest CT at 3-6 months is recommended. If the nodules are stable at time of repeat CT, then future CT at 18-24 months (from today's scan) is considered optional for low-risk patients, but is recommended for high-risk patients. This recommendation follows the consensus statement: Guidelines for Management of Incidental Pulmonary Nodules Detected on CT Images: From the Fleischner Society 2017; Radiology 2017; 284:228-243. Aortic Atherosclerosis (ICD10-I70.0). Hepatic and renal cysts. Electronically Signed   By: Markus Daft M.D.   On: 07/29/2017 09:38    Assessment/Plan 1. Dementia without behavioral disturbance, unspecified dementia type -progressing functional losses MMSE - Mini Mental State Exam 07/16/2017  Orientation to time 4  Orientation to Place 5  Registration 3  Attention/ Calculation 5  Recall 2  Language- name 2 objects 2  Language- repeat 1  Language- follow 3 step command 3  Language- read & follow direction 1  Write a sentence 1  Copy design 0  Total score 27  -see hpi about reasons she needed to move -also should benefit from increased social support in [place] and closer supervision overall -ST, OT eval and tx  2. Thoracic aortic aneurysm without rupture ([place]) -she's decided she does not want serial scans or surgery if it came to that  3. Pulmonary nodules/lesions, multiple -for f/u CT in 6 mos b/c surgeon discussed that if it was a growing, malignant tumor (less likely given her bronchiectasis is likely explanation), she could possibly tolerate local radiation  4. Bronchiectasis without complication ([place]) -had pneumonia once since moving here -is back off all nebulizers though her prior pulmonologist did recommend some regular  use of these -she is asymptomatic at present and updated on pneumonia vaccines  5. Thoracolumbar back pain -new since fall early this am -obtain xrays of thoracic and lumbar spine to evaluate for compression fx, but suspect more muscular as it's moved from neck and lumbar to midspine as the day's gone on and she only hurts to bend over  -heat or ice 20 mins qid until resolved  6. Fall, initial encounter -no major injury noted, see#5, must use walker at all times  7. Overactive bladder -for PTNS with Dr. Matilde Sprang to decrease frequency, hopefully  8. Recurrent UTI -cont trimethoprim to prevent and try to sanitize her bladder, get rid of inflammation--hopefully won't need it forever  9. Primary open angle glaucoma of both eyes, unspecified glaucoma stage -significant visual loss, can still read, but has difficulty getting around, seeing more at a distance and this affects her cognition  10.  ACP:   Last ACP Note 07/03/2017 to 10/01/2017  ACP (Advance Care Planning) by Gayland Curry, DO at 10/01/2017 10:46 AM    Date of Service   Author Author Type Status Note Type File Time  10/01/2017 Addend Gayland Curry, DO Physician Addendum ACP (Advance Care Planning) 10/01/2017             Code Status: DNR--reviewed again today--pt is clear about this when I met with [place].    Goals of Care: Advanced Directive information Advanced Directives 10/01/2017  Does Patient Have a Medical Advance Directive? Yes  Type of Advance Directive [place]  Does patient want to make changes to medical advance directive? No - Patient declined  Copy of [place] in Chart? Yes  She also mentions that if she had pneumonia, she would only want treatment if it made her feel better, not to prolong her life.  We will need to complete her MOST form at her visit.  I believe I may have already started this discussion with her and her daughter, [name], in  clinic.  She is adjusting to her new location in [place], but only moved last night.  She's moved 3 times in the past year so it's a lot.  She agrees to work with Lyn to get help her get labels on her items again which helped [place] considerably in [place].   18 mins spent on ACP.                  Family/ staff Communication: discussed with [place], med tech, nurse manager  Labs/tests ordered:  Tdap, ST, OT  Vicktoria Muckey L. Laiken Sandy, D.O. [facility] [address], [place] [address] Cell Phone (Mon-Fri 8am-5pm):  [phone] On Call:  [phone] & follow prompts after 5pm & weekends Office Phone:  [phone] Office Fax:  [phone]

## 2017-10-01 NOTE — ACP (Advance Care Planning) (Addendum)
Code Status: DNR--reviewed again today--pt is clear about this when I met with [place] in [place] today.    Goals of Care: Advanced Directive information Advanced Directives 10/01/2017  Does Patient Have a Medical Advance Directive? Yes  Type of Advance Directive [place]  Does patient want to make changes to medical advance directive? No - Patient declined  Copy of [place] in Chart? Yes  She also mentions that if she had pneumonia, she would only want treatment if it made her feel better, not to prolong her life.  We will need to complete her MOST form at her visit.  I believe I may have already started this discussion with her and her daughter, [name], in clinic.  She is adjusting to her new location in [place], but only moved last night.  She's moved 3 times in the past year so it's a lot.  She agrees to work with Lyn to get help her get labels on her items again which [place] considerably in [place]   18 mins spent on ACP.

## 2017-10-08 DIAGNOSIS — R278 Other lack of coordination: Secondary | ICD-10-CM | POA: Diagnosis not present

## 2017-10-08 DIAGNOSIS — M6389 Disorders of muscle in diseases classified elsewhere, multiple sites: Secondary | ICD-10-CM | POA: Diagnosis not present

## 2017-10-08 DIAGNOSIS — G301 Alzheimer's disease with late onset: Secondary | ICD-10-CM | POA: Diagnosis not present

## 2017-10-08 DIAGNOSIS — R41841 Cognitive communication deficit: Secondary | ICD-10-CM | POA: Diagnosis not present

## 2017-10-08 DIAGNOSIS — H53453 Other localized visual field defect, bilateral: Secondary | ICD-10-CM | POA: Diagnosis not present

## 2017-10-08 DIAGNOSIS — N39498 Other specified urinary incontinence: Secondary | ICD-10-CM | POA: Diagnosis not present

## 2017-10-08 DIAGNOSIS — R2681 Unsteadiness on feet: Secondary | ICD-10-CM | POA: Diagnosis not present

## 2017-10-08 DIAGNOSIS — R258 Other abnormal involuntary movements: Secondary | ICD-10-CM | POA: Diagnosis not present

## 2017-10-08 DIAGNOSIS — M545 Low back pain: Secondary | ICD-10-CM | POA: Diagnosis not present

## 2017-10-08 DIAGNOSIS — H4089 Other specified glaucoma: Secondary | ICD-10-CM | POA: Diagnosis not present

## 2017-10-08 DIAGNOSIS — R296 Repeated falls: Secondary | ICD-10-CM | POA: Diagnosis not present

## 2017-10-08 DIAGNOSIS — F0280 Dementia in other diseases classified elsewhere without behavioral disturbance: Secondary | ICD-10-CM | POA: Diagnosis not present

## 2017-10-09 DIAGNOSIS — M6389 Disorders of muscle in diseases classified elsewhere, multiple sites: Secondary | ICD-10-CM | POA: Diagnosis not present

## 2017-10-09 DIAGNOSIS — R258 Other abnormal involuntary movements: Secondary | ICD-10-CM | POA: Diagnosis not present

## 2017-10-09 DIAGNOSIS — G301 Alzheimer's disease with late onset: Secondary | ICD-10-CM | POA: Diagnosis not present

## 2017-10-09 DIAGNOSIS — R2681 Unsteadiness on feet: Secondary | ICD-10-CM | POA: Diagnosis not present

## 2017-10-09 DIAGNOSIS — M545 Low back pain: Secondary | ICD-10-CM | POA: Diagnosis not present

## 2017-10-09 DIAGNOSIS — R41841 Cognitive communication deficit: Secondary | ICD-10-CM | POA: Diagnosis not present

## 2017-10-10 DIAGNOSIS — M6389 Disorders of muscle in diseases classified elsewhere, multiple sites: Secondary | ICD-10-CM | POA: Diagnosis not present

## 2017-10-10 DIAGNOSIS — R2681 Unsteadiness on feet: Secondary | ICD-10-CM | POA: Diagnosis not present

## 2017-10-10 DIAGNOSIS — G301 Alzheimer's disease with late onset: Secondary | ICD-10-CM | POA: Diagnosis not present

## 2017-10-10 DIAGNOSIS — R258 Other abnormal involuntary movements: Secondary | ICD-10-CM | POA: Diagnosis not present

## 2017-10-10 DIAGNOSIS — M545 Low back pain: Secondary | ICD-10-CM | POA: Diagnosis not present

## 2017-10-10 DIAGNOSIS — R41841 Cognitive communication deficit: Secondary | ICD-10-CM | POA: Diagnosis not present

## 2017-10-14 DIAGNOSIS — R41841 Cognitive communication deficit: Secondary | ICD-10-CM | POA: Diagnosis not present

## 2017-10-14 DIAGNOSIS — M545 Low back pain: Secondary | ICD-10-CM | POA: Diagnosis not present

## 2017-10-14 DIAGNOSIS — R258 Other abnormal involuntary movements: Secondary | ICD-10-CM | POA: Diagnosis not present

## 2017-10-14 DIAGNOSIS — R2681 Unsteadiness on feet: Secondary | ICD-10-CM | POA: Diagnosis not present

## 2017-10-14 DIAGNOSIS — G301 Alzheimer's disease with late onset: Secondary | ICD-10-CM | POA: Diagnosis not present

## 2017-10-14 DIAGNOSIS — M6389 Disorders of muscle in diseases classified elsewhere, multiple sites: Secondary | ICD-10-CM | POA: Diagnosis not present

## 2017-10-15 DIAGNOSIS — R2681 Unsteadiness on feet: Secondary | ICD-10-CM | POA: Diagnosis not present

## 2017-10-15 DIAGNOSIS — G301 Alzheimer's disease with late onset: Secondary | ICD-10-CM | POA: Diagnosis not present

## 2017-10-15 DIAGNOSIS — R258 Other abnormal involuntary movements: Secondary | ICD-10-CM | POA: Diagnosis not present

## 2017-10-15 DIAGNOSIS — M6389 Disorders of muscle in diseases classified elsewhere, multiple sites: Secondary | ICD-10-CM | POA: Diagnosis not present

## 2017-10-15 DIAGNOSIS — R41841 Cognitive communication deficit: Secondary | ICD-10-CM | POA: Diagnosis not present

## 2017-10-15 DIAGNOSIS — M545 Low back pain: Secondary | ICD-10-CM | POA: Diagnosis not present

## 2017-10-16 DIAGNOSIS — R41841 Cognitive communication deficit: Secondary | ICD-10-CM | POA: Diagnosis not present

## 2017-10-16 DIAGNOSIS — G301 Alzheimer's disease with late onset: Secondary | ICD-10-CM | POA: Diagnosis not present

## 2017-10-16 DIAGNOSIS — M6389 Disorders of muscle in diseases classified elsewhere, multiple sites: Secondary | ICD-10-CM | POA: Diagnosis not present

## 2017-10-16 DIAGNOSIS — M545 Low back pain: Secondary | ICD-10-CM | POA: Diagnosis not present

## 2017-10-16 DIAGNOSIS — R2681 Unsteadiness on feet: Secondary | ICD-10-CM | POA: Diagnosis not present

## 2017-10-16 DIAGNOSIS — R258 Other abnormal involuntary movements: Secondary | ICD-10-CM | POA: Diagnosis not present

## 2017-10-17 DIAGNOSIS — M6389 Disorders of muscle in diseases classified elsewhere, multiple sites: Secondary | ICD-10-CM | POA: Diagnosis not present

## 2017-10-17 DIAGNOSIS — R2681 Unsteadiness on feet: Secondary | ICD-10-CM | POA: Diagnosis not present

## 2017-10-17 DIAGNOSIS — R258 Other abnormal involuntary movements: Secondary | ICD-10-CM | POA: Diagnosis not present

## 2017-10-17 DIAGNOSIS — R41841 Cognitive communication deficit: Secondary | ICD-10-CM | POA: Diagnosis not present

## 2017-10-17 DIAGNOSIS — M545 Low back pain: Secondary | ICD-10-CM | POA: Diagnosis not present

## 2017-10-17 DIAGNOSIS — G301 Alzheimer's disease with late onset: Secondary | ICD-10-CM | POA: Diagnosis not present

## 2017-10-18 DIAGNOSIS — R41841 Cognitive communication deficit: Secondary | ICD-10-CM | POA: Diagnosis not present

## 2017-10-18 DIAGNOSIS — M6389 Disorders of muscle in diseases classified elsewhere, multiple sites: Secondary | ICD-10-CM | POA: Diagnosis not present

## 2017-10-18 DIAGNOSIS — R2681 Unsteadiness on feet: Secondary | ICD-10-CM | POA: Diagnosis not present

## 2017-10-18 DIAGNOSIS — M545 Low back pain: Secondary | ICD-10-CM | POA: Diagnosis not present

## 2017-10-18 DIAGNOSIS — R258 Other abnormal involuntary movements: Secondary | ICD-10-CM | POA: Diagnosis not present

## 2017-10-18 DIAGNOSIS — G301 Alzheimer's disease with late onset: Secondary | ICD-10-CM | POA: Diagnosis not present

## 2017-10-21 DIAGNOSIS — G301 Alzheimer's disease with late onset: Secondary | ICD-10-CM | POA: Diagnosis not present

## 2017-10-21 DIAGNOSIS — R258 Other abnormal involuntary movements: Secondary | ICD-10-CM | POA: Diagnosis not present

## 2017-10-21 DIAGNOSIS — R2681 Unsteadiness on feet: Secondary | ICD-10-CM | POA: Diagnosis not present

## 2017-10-21 DIAGNOSIS — M545 Low back pain: Secondary | ICD-10-CM | POA: Diagnosis not present

## 2017-10-21 DIAGNOSIS — M6389 Disorders of muscle in diseases classified elsewhere, multiple sites: Secondary | ICD-10-CM | POA: Diagnosis not present

## 2017-10-21 DIAGNOSIS — R41841 Cognitive communication deficit: Secondary | ICD-10-CM | POA: Diagnosis not present

## 2017-10-22 DIAGNOSIS — G301 Alzheimer's disease with late onset: Secondary | ICD-10-CM | POA: Diagnosis not present

## 2017-10-22 DIAGNOSIS — M6389 Disorders of muscle in diseases classified elsewhere, multiple sites: Secondary | ICD-10-CM | POA: Diagnosis not present

## 2017-10-22 DIAGNOSIS — R2681 Unsteadiness on feet: Secondary | ICD-10-CM | POA: Diagnosis not present

## 2017-10-22 DIAGNOSIS — M545 Low back pain: Secondary | ICD-10-CM | POA: Diagnosis not present

## 2017-10-22 DIAGNOSIS — R41841 Cognitive communication deficit: Secondary | ICD-10-CM | POA: Diagnosis not present

## 2017-10-22 DIAGNOSIS — R258 Other abnormal involuntary movements: Secondary | ICD-10-CM | POA: Diagnosis not present

## 2017-10-23 DIAGNOSIS — R258 Other abnormal involuntary movements: Secondary | ICD-10-CM | POA: Diagnosis not present

## 2017-10-23 DIAGNOSIS — R2681 Unsteadiness on feet: Secondary | ICD-10-CM | POA: Diagnosis not present

## 2017-10-23 DIAGNOSIS — M545 Low back pain: Secondary | ICD-10-CM | POA: Diagnosis not present

## 2017-10-23 DIAGNOSIS — G301 Alzheimer's disease with late onset: Secondary | ICD-10-CM | POA: Diagnosis not present

## 2017-10-23 DIAGNOSIS — R41841 Cognitive communication deficit: Secondary | ICD-10-CM | POA: Diagnosis not present

## 2017-10-23 DIAGNOSIS — M6389 Disorders of muscle in diseases classified elsewhere, multiple sites: Secondary | ICD-10-CM | POA: Diagnosis not present

## 2017-10-24 DIAGNOSIS — R258 Other abnormal involuntary movements: Secondary | ICD-10-CM | POA: Diagnosis not present

## 2017-10-24 DIAGNOSIS — M545 Low back pain: Secondary | ICD-10-CM | POA: Diagnosis not present

## 2017-10-24 DIAGNOSIS — R2681 Unsteadiness on feet: Secondary | ICD-10-CM | POA: Diagnosis not present

## 2017-10-24 DIAGNOSIS — M6389 Disorders of muscle in diseases classified elsewhere, multiple sites: Secondary | ICD-10-CM | POA: Diagnosis not present

## 2017-10-24 DIAGNOSIS — G301 Alzheimer's disease with late onset: Secondary | ICD-10-CM | POA: Diagnosis not present

## 2017-10-24 DIAGNOSIS — R41841 Cognitive communication deficit: Secondary | ICD-10-CM | POA: Diagnosis not present

## 2017-10-24 NOTE — Addendum Note (Signed)
Addended by: Gayland Curry on: 10/24/2017 08:27 AM   Modules accepted: Level of Service

## 2017-10-25 DIAGNOSIS — R41841 Cognitive communication deficit: Secondary | ICD-10-CM | POA: Diagnosis not present

## 2017-10-25 DIAGNOSIS — R2681 Unsteadiness on feet: Secondary | ICD-10-CM | POA: Diagnosis not present

## 2017-10-25 DIAGNOSIS — M545 Low back pain: Secondary | ICD-10-CM | POA: Diagnosis not present

## 2017-10-25 DIAGNOSIS — R258 Other abnormal involuntary movements: Secondary | ICD-10-CM | POA: Diagnosis not present

## 2017-10-25 DIAGNOSIS — M6389 Disorders of muscle in diseases classified elsewhere, multiple sites: Secondary | ICD-10-CM | POA: Diagnosis not present

## 2017-10-25 DIAGNOSIS — G301 Alzheimer's disease with late onset: Secondary | ICD-10-CM | POA: Diagnosis not present

## 2017-10-28 DIAGNOSIS — N39498 Other specified urinary incontinence: Secondary | ICD-10-CM | POA: Diagnosis not present

## 2017-10-28 DIAGNOSIS — H4089 Other specified glaucoma: Secondary | ICD-10-CM | POA: Diagnosis not present

## 2017-10-28 DIAGNOSIS — M545 Low back pain: Secondary | ICD-10-CM | POA: Diagnosis not present

## 2017-10-28 DIAGNOSIS — M6389 Disorders of muscle in diseases classified elsewhere, multiple sites: Secondary | ICD-10-CM | POA: Diagnosis not present

## 2017-10-28 DIAGNOSIS — H53453 Other localized visual field defect, bilateral: Secondary | ICD-10-CM | POA: Diagnosis not present

## 2017-10-28 DIAGNOSIS — R2681 Unsteadiness on feet: Secondary | ICD-10-CM | POA: Diagnosis not present

## 2017-10-28 DIAGNOSIS — R258 Other abnormal involuntary movements: Secondary | ICD-10-CM | POA: Diagnosis not present

## 2017-10-28 DIAGNOSIS — R278 Other lack of coordination: Secondary | ICD-10-CM | POA: Diagnosis not present

## 2017-10-28 DIAGNOSIS — R41841 Cognitive communication deficit: Secondary | ICD-10-CM | POA: Diagnosis not present

## 2017-10-28 DIAGNOSIS — R296 Repeated falls: Secondary | ICD-10-CM | POA: Diagnosis not present

## 2017-10-28 DIAGNOSIS — G301 Alzheimer's disease with late onset: Secondary | ICD-10-CM | POA: Diagnosis not present

## 2017-10-28 DIAGNOSIS — F0280 Dementia in other diseases classified elsewhere without behavioral disturbance: Secondary | ICD-10-CM | POA: Diagnosis not present

## 2017-10-29 DIAGNOSIS — M545 Low back pain: Secondary | ICD-10-CM | POA: Diagnosis not present

## 2017-10-29 DIAGNOSIS — G301 Alzheimer's disease with late onset: Secondary | ICD-10-CM | POA: Diagnosis not present

## 2017-10-29 DIAGNOSIS — R41841 Cognitive communication deficit: Secondary | ICD-10-CM | POA: Diagnosis not present

## 2017-10-29 DIAGNOSIS — R258 Other abnormal involuntary movements: Secondary | ICD-10-CM | POA: Diagnosis not present

## 2017-10-29 DIAGNOSIS — R2681 Unsteadiness on feet: Secondary | ICD-10-CM | POA: Diagnosis not present

## 2017-10-29 DIAGNOSIS — M6389 Disorders of muscle in diseases classified elsewhere, multiple sites: Secondary | ICD-10-CM | POA: Diagnosis not present

## 2017-10-30 ENCOUNTER — Encounter: Payer: Self-pay | Admitting: Internal Medicine

## 2017-10-30 ENCOUNTER — Non-Acute Institutional Stay: Payer: Medicare Other | Admitting: Internal Medicine

## 2017-10-30 ENCOUNTER — Encounter: Payer: Medicare Other | Admitting: Internal Medicine

## 2017-10-30 VITALS — BP 118/70 | HR 75 | Temp 98.4°F | Ht 68.0 in | Wt 146.0 lb

## 2017-10-30 DIAGNOSIS — N3281 Overactive bladder: Secondary | ICD-10-CM

## 2017-10-30 DIAGNOSIS — R2681 Unsteadiness on feet: Secondary | ICD-10-CM | POA: Diagnosis not present

## 2017-10-30 DIAGNOSIS — R258 Other abnormal involuntary movements: Secondary | ICD-10-CM | POA: Diagnosis not present

## 2017-10-30 DIAGNOSIS — I252 Old myocardial infarction: Secondary | ICD-10-CM

## 2017-10-30 DIAGNOSIS — I10 Essential (primary) hypertension: Secondary | ICD-10-CM

## 2017-10-30 DIAGNOSIS — G301 Alzheimer's disease with late onset: Secondary | ICD-10-CM | POA: Diagnosis not present

## 2017-10-30 DIAGNOSIS — I951 Orthostatic hypotension: Secondary | ICD-10-CM

## 2017-10-30 DIAGNOSIS — G20C Parkinsonism, unspecified: Secondary | ICD-10-CM

## 2017-10-30 DIAGNOSIS — I2510 Atherosclerotic heart disease of native coronary artery without angina pectoris: Secondary | ICD-10-CM | POA: Diagnosis not present

## 2017-10-30 DIAGNOSIS — G252 Other specified forms of tremor: Secondary | ICD-10-CM

## 2017-10-30 DIAGNOSIS — G20 Parkinson's disease: Secondary | ICD-10-CM | POA: Diagnosis not present

## 2017-10-30 DIAGNOSIS — K5909 Other constipation: Secondary | ICD-10-CM

## 2017-10-30 DIAGNOSIS — R296 Repeated falls: Secondary | ICD-10-CM | POA: Diagnosis not present

## 2017-10-30 DIAGNOSIS — R41841 Cognitive communication deficit: Secondary | ICD-10-CM | POA: Diagnosis not present

## 2017-10-30 DIAGNOSIS — F0150 Vascular dementia without behavioral disturbance: Secondary | ICD-10-CM | POA: Diagnosis not present

## 2017-10-30 DIAGNOSIS — R259 Unspecified abnormal involuntary movements: Secondary | ICD-10-CM

## 2017-10-30 DIAGNOSIS — M6389 Disorders of muscle in diseases classified elsewhere, multiple sites: Secondary | ICD-10-CM | POA: Diagnosis not present

## 2017-10-30 DIAGNOSIS — M545 Low back pain: Secondary | ICD-10-CM | POA: Diagnosis not present

## 2017-10-30 MED ORDER — IRBESARTAN 75 MG PO TABS: 37.5000 mg | ORAL_TABLET | Freq: Every day | ORAL | 5 refills | Status: DC

## 2017-10-30 NOTE — Progress Notes (Signed)
Location:  [place] of Service:  Clinic (12)  Provider: Dsean Vantol L. [name], D.O., C.M.D.  Code Status: DNR Goals of Care:  Advanced Directives 10/30/2017  Does Patient Have a Medical Advance Directive? Yes  Type of Paramedic of [place];Out of facility DNR (pink MOST or yellow form)  Does patient want to make changes to medical advance directive? No - Patient declined  Copy of [place] in Chart? Yes  Pre-existing out of facility DNR order (yellow form or pink MOST form) Yellow form placed in chart (order not valid for inpatient use)     Chief Complaint  Patient presents with  . Acute Visit    urinary incontience, discuss blood pressure, falling frequently    HPI: Patient is a 84 y.o. female seen today for an acute visit for urinary incontinence (made by [place] nurse but pt sees Dr. Matilde Sprang for this), fluctuant bp, tremor, frequent falls.    She will be starting her percutaneous tibial nerve stimulation for her urinary incontinence with Dr. Matilde Sprang in August.  Frequency and urgency remain issues.    She's been going downhill since her move to [place] (actually before that, but she's noticing now).  She's been falling a lot, having dizzy spills and has a tremor of her left side when sitting.    She stands quickly.  BP was normal after the falls a couple times when checked.  Reports her mind getting foggier (has dementia, felt to be vascular based on CT and fairly sudden onset).  She had dizziness after she whacked herself in the head with the fall in the elevator.  She notices dizziness after she eats in the dining room.  She has to stand and get steady before moving.  At one point around the time of her move and after the fall in the elevator, her blood pressures were running consistently low in the 35T systolic so her blood pressure meds were held.  She was also orthostatic at that time.  Staff requested to know immediately what to do  with the meds when she moved so they were all discontinued.    In the past couple of weeks, her bps have gone up to 732K-025K systolic.    Her left foot starts twitching and just does so uncontrollably.  She says she can make it stop and shows me this. It's a fidgety type movement.  The left leg also felt like concrete one time and sometimes appears stiff when she walks vs her right.  She has been working with PT, OT and [facility] [place].  She is using a rollator walker and walks very well with it as long as she remembers to use it and stands slowly.  OT staff noted some festination with her gait.  She does walk quickly.   She's also been having constipation.  Has not been faithful about miralax daily prn use (not asking of it). Had some prune juice occasionally which helps.  Stools are like small rocks.  She says she doesn't go for 8-10 days, but her daughter thinks it's an overestimate.  She has glaucoma with very poor vision and some visual apraxia.  She has worked with [facility] on this and they've been helping her to get situated in the [place] apt (prior [place] apt had labels on everything, but pt does not like the bright-colored labels though she can see them better).    She did not tolerate aricept 10mg  so it was decreased back  to 5mg  around the time of her move to [place] also.  Past Medical History:  Diagnosis Date  . Bronchiectasis ([place])   . Cognitive changes    mild  . Glaucoma   . Heart attack ([place])    mild  . Hypertension   . Incontinence     Past Surgical History:  Procedure Laterality Date  . CATARACT EXTRACTION  1994  . CATARACT EXTRACTION  2006    No Known Allergies  Outpatient Encounter Medications as of 10/30/2017  Medication Sig  . Ascorbic Acid (VITAMIN C) 1000 MG tablet Take 1,000 mg by mouth daily.  Marland Kitchen aspirin 81 MG chewable tablet Chew 1 tablet by mouth daily.  . Cholecalciferol (VITAMIN D3) 5000 units CAPS Take 1 capsule (5,000 Units total) by mouth daily.  Marland Kitchen donepezil (ARICEPT) 5 MG tablet  Take 1 tablet (5 mg total) by mouth at bedtime.  . Glucosamine-Chondroit-Vit C-Mn (GLUCOSAMINE 1500 COMPLEX PO) Take 1-2 tablets by mouth daily.  . isosorbide mononitrate (IMDUR) 60 MG 24 hr tablet Take 1 tablet (60 mg total) by mouth daily.  Marland Kitchen latanoprost (XALATAN) 0.005 % ophthalmic solution Place 1 drop into both eyes at bedtime.  . Lutein 6 MG CAPS Take 1 capsule by mouth daily.  . Multiple Vitamin (MULTIVITAMIN) tablet Take 1 tablet by mouth daily.  . polyethylene glycol (MIRALAX / GLYCOLAX) packet Take 17 g by mouth daily as needed.  . pravastatin (PRAVACHOL) 40 MG tablet TAKE 1 TABLET ONCE DAILY.  Marland Kitchen trimethoprim (TRIMPEX) 100 MG tablet Take 1 tablet (100 mg total) by mouth daily.  Marland Kitchen amLODipine (NORVASC) 2.5 MG tablet Take 1 tablet (2.5 mg total) by mouth daily. Take with 5 mg to equal 7.5 mg  . amLODipine (NORVASC) 5 MG tablet TAKE 1 TABLET ONCE DAILY.  . metoprolol succinate (TOPROL-XL) 25 MG 24 hr tablet TAKE 1 TABLET ONCE DAILY.   No facility-administered encounter medications on file as of 10/30/2017.     Review of Systems:  Review of Systems  Constitutional: Negative for chills, fever and malaise/fatigue.  HENT: Negative for congestion and hearing loss.   Eyes: Positive for blurred vision.       Glaucoma  Respiratory: Negative for cough and shortness of breath.   Cardiovascular: Negative for chest pain, palpitations and leg swelling.  Gastrointestinal: Positive for constipation. Negative for abdominal pain, blood in stool, diarrhea and melena.  Genitourinary: Positive for frequency and urgency. Negative for dysuria, flank pain and hematuria.  Musculoskeletal: Positive for falls. Negative for joint pain.  Skin: Negative for itching and rash.  Neurological: Positive for dizziness and tremors. Negative for tingling, sensory change, speech change, focal weakness, seizures, loss of consciousness, weakness and headaches.  Endo/Heme/Allergies: Bruises/bleeds easily.    Psychiatric/Behavioral: Positive for memory loss. Negative for depression and hallucinations. The patient is not nervous/anxious and does not have insomnia.     Health Maintenance  Topic Date Due  . TETANUS/TDAP  05/19/1950  . INFLUENZA VACCINE  11/28/2017  . DEXA SCAN  Completed  . PNA vac Low Risk Adult  Completed    Physical Exam: Vitals:   10/30/17 1337  BP: 118/70  Pulse: 75  Temp: 98.4 F (36.9 C)  TempSrc: Oral  SpO2: 96%  Weight: 146 lb (66.2 kg)  Height: 5\' 8"  (1.727 m)   Body mass index is 22.2 kg/m. Physical Exam  Constitutional: She appears well-developed. No distress.  HENT:  Head: Normocephalic and atraumatic.  Right Ear: External ear normal.  Eyes: Pupils are equal, round,  and reactive to light. EOM are normal.  glasses  Cardiovascular: Normal rate, regular rhythm, normal heart sounds and intact distal pulses.  Pulmonary/Chest: Effort normal. She has wheezes.  Abdominal: Bowel sounds are normal.  Neurological: She is alert. She displays normal reflexes. No cranial nerve deficit or sensory deficit. She exhibits abnormal muscle tone. Coordination abnormal.  Oriented to person, place, not time; gets lost in facility easily; decreased coordination with toe taps on left with slowing vs right; fast tremor of left leg at rest that she seems to be able to stop on her own; bilateral UE intention tremor and slight resting tremor left greater than right; ambulates with rollator walker--does not have classic shuffling gait  Skin: Skin is warm and dry.  Psychiatric:  Masked facies vs flat affect    Labs reviewed: Basic Metabolic Panel: Recent Labs    01/01/17 0700  NA 141  K 3.9  BUN 24*  CREATININE 0.8  TSH 2.89   Liver Function Tests: Recent Labs    01/01/17 0700  AST 20  ALT 16  ALKPHOS 62   No results for input(s): LIPASE, AMYLASE in the last 8760 hours. No results for input(s): AMMONIA in the last 8760 hours. CBC: Recent Labs    01/01/17  WBC  5.8  HGB 12.6  HCT 39  PLT 178   Assessment/Plan 1. Vascular dementia without behavioral disturbance -has been my diagnosis based on her CT brain, more rapid onset of her dementia; however, more recently, she's been developing more pronounced parkinsonism--?parkinsonian syndrome -does not seem to be a classic PD to me -continues on aricept 5mg  at hs   2. Benign hypertension -bps now elevated -opted to avoid beta blocker (? Helped tremor before though) and amlodipine which would be more apt to cause orthostasis - encourage hydration, getting up slowly and start low dose irbesartan for slight bp effect - f/u bmp in 1 wk - irbesartan (AVAPRO) 75 MG tablet; Take 0.5 tablets (37.5 mg total) by mouth daily.  Dispense: 15 tablet; Refill: 5  3. Parkinsonism, unspecified Parkinsonism type ([place]) -refer to Dr. Carles Collet for evaluation -? PSP or other atypical parkinsonism or vascular parkinsonism -odd presentation but getting more notable over the past year,  It seems  4. Recurrent falls -gets up fast and gets dizzy and also does not always grab her walker  5. Resting tremor -fast tremor of left leg that's controllable, then bilateral UE with slow, resting tremor worse on left and some degree of intention tremor, also with finger to nose  6. Chronic constipation -manage with prune juice daily, miralax qod and just one senokot-s daily at this point -she's terrible about her hydration so I'm concerned she will get impacted with fiber supplements  7. Overactive bladder -for PTNS with Dr. Matilde Sprang starting in August  8. Orthostatic hypotension -hydrate, change positions slowly and avoid vasodilatory meds that will worsen the condition, monitor  Labs/tests ordered:  Bmp in one week, also ordered shingrix Did get her tdap here   Next appt:  11/20/2017  Katreena Schupp L. Aveah Castell, D.O. [facility] [address], [place] [address] Cell Phone (Mon-Fri  8am-5pm):  [phone] On Call:  [phone] & follow prompts after 5pm & weekends Office Phone:  [phone] Office Fax:  [phone]

## 2017-10-31 DIAGNOSIS — G301 Alzheimer's disease with late onset: Secondary | ICD-10-CM | POA: Diagnosis not present

## 2017-10-31 DIAGNOSIS — M6389 Disorders of muscle in diseases classified elsewhere, multiple sites: Secondary | ICD-10-CM | POA: Diagnosis not present

## 2017-10-31 DIAGNOSIS — M545 Low back pain: Secondary | ICD-10-CM | POA: Diagnosis not present

## 2017-10-31 DIAGNOSIS — R258 Other abnormal involuntary movements: Secondary | ICD-10-CM | POA: Diagnosis not present

## 2017-10-31 DIAGNOSIS — R2681 Unsteadiness on feet: Secondary | ICD-10-CM | POA: Diagnosis not present

## 2017-10-31 DIAGNOSIS — R41841 Cognitive communication deficit: Secondary | ICD-10-CM | POA: Diagnosis not present

## 2017-11-04 ENCOUNTER — Encounter: Payer: Self-pay | Admitting: Internal Medicine

## 2017-11-04 DIAGNOSIS — R296 Repeated falls: Secondary | ICD-10-CM | POA: Insufficient documentation

## 2017-11-04 DIAGNOSIS — R258 Other abnormal involuntary movements: Secondary | ICD-10-CM | POA: Diagnosis not present

## 2017-11-04 DIAGNOSIS — G20C Parkinsonism, unspecified: Secondary | ICD-10-CM | POA: Insufficient documentation

## 2017-11-04 DIAGNOSIS — R41841 Cognitive communication deficit: Secondary | ICD-10-CM | POA: Diagnosis not present

## 2017-11-04 DIAGNOSIS — N3281 Overactive bladder: Secondary | ICD-10-CM | POA: Insufficient documentation

## 2017-11-04 DIAGNOSIS — M545 Low back pain: Secondary | ICD-10-CM | POA: Diagnosis not present

## 2017-11-04 DIAGNOSIS — R2681 Unsteadiness on feet: Secondary | ICD-10-CM | POA: Diagnosis not present

## 2017-11-04 DIAGNOSIS — G301 Alzheimer's disease with late onset: Secondary | ICD-10-CM | POA: Diagnosis not present

## 2017-11-04 DIAGNOSIS — M6389 Disorders of muscle in diseases classified elsewhere, multiple sites: Secondary | ICD-10-CM | POA: Diagnosis not present

## 2017-11-04 DIAGNOSIS — G20 Parkinson's disease: Secondary | ICD-10-CM | POA: Insufficient documentation

## 2017-11-05 DIAGNOSIS — R2681 Unsteadiness on feet: Secondary | ICD-10-CM | POA: Diagnosis not present

## 2017-11-05 DIAGNOSIS — M6389 Disorders of muscle in diseases classified elsewhere, multiple sites: Secondary | ICD-10-CM | POA: Diagnosis not present

## 2017-11-05 DIAGNOSIS — H353132 Nonexudative age-related macular degeneration, bilateral, intermediate dry stage: Secondary | ICD-10-CM | POA: Diagnosis not present

## 2017-11-05 DIAGNOSIS — H5213 Myopia, bilateral: Secondary | ICD-10-CM | POA: Diagnosis not present

## 2017-11-05 DIAGNOSIS — M545 Low back pain: Secondary | ICD-10-CM | POA: Diagnosis not present

## 2017-11-05 DIAGNOSIS — H401132 Primary open-angle glaucoma, bilateral, moderate stage: Secondary | ICD-10-CM | POA: Diagnosis not present

## 2017-11-05 DIAGNOSIS — H1789 Other corneal scars and opacities: Secondary | ICD-10-CM | POA: Diagnosis not present

## 2017-11-05 DIAGNOSIS — G301 Alzheimer's disease with late onset: Secondary | ICD-10-CM | POA: Diagnosis not present

## 2017-11-05 DIAGNOSIS — R41841 Cognitive communication deficit: Secondary | ICD-10-CM | POA: Diagnosis not present

## 2017-11-05 DIAGNOSIS — R258 Other abnormal involuntary movements: Secondary | ICD-10-CM | POA: Diagnosis not present

## 2017-11-06 DIAGNOSIS — R2681 Unsteadiness on feet: Secondary | ICD-10-CM | POA: Diagnosis not present

## 2017-11-06 DIAGNOSIS — M6389 Disorders of muscle in diseases classified elsewhere, multiple sites: Secondary | ICD-10-CM | POA: Diagnosis not present

## 2017-11-06 DIAGNOSIS — M545 Low back pain: Secondary | ICD-10-CM | POA: Diagnosis not present

## 2017-11-06 DIAGNOSIS — R258 Other abnormal involuntary movements: Secondary | ICD-10-CM | POA: Diagnosis not present

## 2017-11-06 DIAGNOSIS — G301 Alzheimer's disease with late onset: Secondary | ICD-10-CM | POA: Diagnosis not present

## 2017-11-06 DIAGNOSIS — R41841 Cognitive communication deficit: Secondary | ICD-10-CM | POA: Diagnosis not present

## 2017-11-07 DIAGNOSIS — M545 Low back pain: Secondary | ICD-10-CM | POA: Diagnosis not present

## 2017-11-07 DIAGNOSIS — G301 Alzheimer's disease with late onset: Secondary | ICD-10-CM | POA: Diagnosis not present

## 2017-11-07 DIAGNOSIS — R41841 Cognitive communication deficit: Secondary | ICD-10-CM | POA: Diagnosis not present

## 2017-11-07 DIAGNOSIS — R258 Other abnormal involuntary movements: Secondary | ICD-10-CM | POA: Diagnosis not present

## 2017-11-07 DIAGNOSIS — D649 Anemia, unspecified: Secondary | ICD-10-CM | POA: Diagnosis not present

## 2017-11-07 DIAGNOSIS — R7989 Other specified abnormal findings of blood chemistry: Secondary | ICD-10-CM | POA: Diagnosis not present

## 2017-11-07 DIAGNOSIS — R2681 Unsteadiness on feet: Secondary | ICD-10-CM | POA: Diagnosis not present

## 2017-11-07 DIAGNOSIS — M6389 Disorders of muscle in diseases classified elsewhere, multiple sites: Secondary | ICD-10-CM | POA: Diagnosis not present

## 2017-11-07 LAB — BASIC METABOLIC PANEL
BUN: 23 — AB (ref 4–21)
Creatinine: 1.2 — AB (ref 0.5–1.1)
Glucose: 108
Potassium: 4.6 (ref 3.4–5.3)
Sodium: 140 (ref 137–147)

## 2017-11-08 DIAGNOSIS — G301 Alzheimer's disease with late onset: Secondary | ICD-10-CM | POA: Diagnosis not present

## 2017-11-08 DIAGNOSIS — M545 Low back pain: Secondary | ICD-10-CM | POA: Diagnosis not present

## 2017-11-08 DIAGNOSIS — R41841 Cognitive communication deficit: Secondary | ICD-10-CM | POA: Diagnosis not present

## 2017-11-08 DIAGNOSIS — R2681 Unsteadiness on feet: Secondary | ICD-10-CM | POA: Diagnosis not present

## 2017-11-08 DIAGNOSIS — R258 Other abnormal involuntary movements: Secondary | ICD-10-CM | POA: Diagnosis not present

## 2017-11-08 DIAGNOSIS — M6389 Disorders of muscle in diseases classified elsewhere, multiple sites: Secondary | ICD-10-CM | POA: Diagnosis not present

## 2017-11-12 DIAGNOSIS — R2681 Unsteadiness on feet: Secondary | ICD-10-CM | POA: Diagnosis not present

## 2017-11-12 DIAGNOSIS — R41841 Cognitive communication deficit: Secondary | ICD-10-CM | POA: Diagnosis not present

## 2017-11-12 DIAGNOSIS — M6389 Disorders of muscle in diseases classified elsewhere, multiple sites: Secondary | ICD-10-CM | POA: Diagnosis not present

## 2017-11-12 DIAGNOSIS — M545 Low back pain: Secondary | ICD-10-CM | POA: Diagnosis not present

## 2017-11-12 DIAGNOSIS — G301 Alzheimer's disease with late onset: Secondary | ICD-10-CM | POA: Diagnosis not present

## 2017-11-12 DIAGNOSIS — R258 Other abnormal involuntary movements: Secondary | ICD-10-CM | POA: Diagnosis not present

## 2017-11-13 DIAGNOSIS — R258 Other abnormal involuntary movements: Secondary | ICD-10-CM | POA: Diagnosis not present

## 2017-11-13 DIAGNOSIS — M6389 Disorders of muscle in diseases classified elsewhere, multiple sites: Secondary | ICD-10-CM | POA: Diagnosis not present

## 2017-11-13 DIAGNOSIS — R2681 Unsteadiness on feet: Secondary | ICD-10-CM | POA: Diagnosis not present

## 2017-11-13 DIAGNOSIS — G301 Alzheimer's disease with late onset: Secondary | ICD-10-CM | POA: Diagnosis not present

## 2017-11-13 DIAGNOSIS — R41841 Cognitive communication deficit: Secondary | ICD-10-CM | POA: Diagnosis not present

## 2017-11-13 DIAGNOSIS — M545 Low back pain: Secondary | ICD-10-CM | POA: Diagnosis not present

## 2017-11-14 ENCOUNTER — Encounter (HOSPITAL_COMMUNITY): Payer: Self-pay | Admitting: Radiology

## 2017-11-14 ENCOUNTER — Emergency Department (HOSPITAL_COMMUNITY): Payer: Medicare Other

## 2017-11-14 ENCOUNTER — Inpatient Hospital Stay (HOSPITAL_COMMUNITY)
Admission: EM | Admit: 2017-11-14 | Discharge: 2017-11-17 | DRG: 690 | Disposition: A | Discharge status: Nursing Home (Includes ICF) | Payer: Medicare Other | Attending: Internal Medicine | Admitting: Internal Medicine

## 2017-11-14 DIAGNOSIS — S199XXA Unspecified injury of neck, initial encounter: Secondary | ICD-10-CM | POA: Diagnosis not present

## 2017-11-14 DIAGNOSIS — R8281 Pyuria: Secondary | ICD-10-CM

## 2017-11-14 DIAGNOSIS — I2510 Atherosclerotic heart disease of native coronary artery without angina pectoris: Secondary | ICD-10-CM | POA: Diagnosis present

## 2017-11-14 DIAGNOSIS — E860 Dehydration: Secondary | ICD-10-CM | POA: Diagnosis present

## 2017-11-14 DIAGNOSIS — R58 Hemorrhage, not elsewhere classified: Secondary | ICD-10-CM | POA: Diagnosis not present

## 2017-11-14 DIAGNOSIS — R42 Dizziness and giddiness: Secondary | ICD-10-CM

## 2017-11-14 DIAGNOSIS — S0990XA Unspecified injury of head, initial encounter: Secondary | ICD-10-CM | POA: Diagnosis not present

## 2017-11-14 DIAGNOSIS — I252 Old myocardial infarction: Secondary | ICD-10-CM

## 2017-11-14 DIAGNOSIS — I712 Thoracic aortic aneurysm, without rupture, unspecified: Secondary | ICD-10-CM | POA: Diagnosis present

## 2017-11-14 DIAGNOSIS — Z79899 Other long term (current) drug therapy: Secondary | ICD-10-CM

## 2017-11-14 DIAGNOSIS — S79912A Unspecified injury of left hip, initial encounter: Secondary | ICD-10-CM | POA: Diagnosis not present

## 2017-11-14 DIAGNOSIS — H409 Unspecified glaucoma: Secondary | ICD-10-CM | POA: Diagnosis present

## 2017-11-14 DIAGNOSIS — D649 Anemia, unspecified: Secondary | ICD-10-CM | POA: Diagnosis present

## 2017-11-14 DIAGNOSIS — M25552 Pain in left hip: Secondary | ICD-10-CM | POA: Diagnosis not present

## 2017-11-14 DIAGNOSIS — N179 Acute kidney failure, unspecified: Secondary | ICD-10-CM | POA: Diagnosis not present

## 2017-11-14 DIAGNOSIS — N390 Urinary tract infection, site not specified: Principal | ICD-10-CM | POA: Diagnosis present

## 2017-11-14 DIAGNOSIS — F09 Unspecified mental disorder due to known physiological condition: Secondary | ICD-10-CM | POA: Diagnosis present

## 2017-11-14 DIAGNOSIS — Z66 Do not resuscitate: Secondary | ICD-10-CM | POA: Diagnosis present

## 2017-11-14 DIAGNOSIS — J479 Bronchiectasis, uncomplicated: Secondary | ICD-10-CM | POA: Diagnosis present

## 2017-11-14 DIAGNOSIS — I10 Essential (primary) hypertension: Secondary | ICD-10-CM | POA: Diagnosis present

## 2017-11-14 DIAGNOSIS — S299XXA Unspecified injury of thorax, initial encounter: Secondary | ICD-10-CM | POA: Diagnosis not present

## 2017-11-14 DIAGNOSIS — W19XXXA Unspecified fall, initial encounter: Secondary | ICD-10-CM | POA: Diagnosis not present

## 2017-11-14 DIAGNOSIS — R109 Unspecified abdominal pain: Secondary | ICD-10-CM | POA: Diagnosis not present

## 2017-11-14 DIAGNOSIS — F0390 Unspecified dementia without behavioral disturbance: Secondary | ICD-10-CM | POA: Diagnosis present

## 2017-11-14 DIAGNOSIS — Z7982 Long term (current) use of aspirin: Secondary | ICD-10-CM

## 2017-11-14 DIAGNOSIS — M542 Cervicalgia: Secondary | ICD-10-CM | POA: Diagnosis not present

## 2017-11-14 DIAGNOSIS — R269 Unspecified abnormalities of gait and mobility: Secondary | ICD-10-CM | POA: Diagnosis present

## 2017-11-14 MED ORDER — ACETAMINOPHEN 325 MG PO TABS
650.0000 mg | ORAL_TABLET | Freq: Once | ORAL | Status: AC
  Administered 2017-11-14: 650 mg via ORAL
  Filled 2017-11-14: qty 2

## 2017-11-14 MED ORDER — SODIUM CHLORIDE 0.9 % IV BOLUS
500.0000 mL | Freq: Once | INTRAVENOUS | Status: AC
  Administered 2017-11-14: 500 mL via INTRAVENOUS

## 2017-11-14 MED ORDER — ONDANSETRON 8 MG PO TBDP
8.0000 mg | ORAL_TABLET | Freq: Once | ORAL | Status: AC
  Administered 2017-11-14: 8 mg via ORAL
  Filled 2017-11-14: qty 1

## 2017-11-14 MED ORDER — MECLIZINE HCL 25 MG PO TABS
12.5000 mg | ORAL_TABLET | Freq: Once | ORAL | Status: AC
  Administered 2017-11-14: 12.5 mg via ORAL
  Filled 2017-11-14: qty 1

## 2017-11-14 NOTE — ED Triage Notes (Signed)
Patient has been dizzy for a few weeks now. She has recently had her medications adjusted to help her with dizziness but still has been dizzy. Tonight she fell and hit her head on the night stand. During the fall she also pulled her dresser over and was hit by the drawers. She reports not being on blood thinners. BP when sitting 160/100, BP when standing 137/70.

## 2017-11-14 NOTE — ED Provider Notes (Signed)
[facility] DEPT Provider Note   CSN: [id number] Arrival date & time: 11/14/17  2158     History   Chief Complaint Chief Complaint  Patient presents with  . Fall    HPI Kaitlyn Ortiz is a 84 y.o. female.   84 year old female with a history of mild cognitive changes (dementia), hypertension presents to the emergency department for evaluation of fall.  Patient has been experiencing dizzy spells over the past few weeks.  She states that she had a "dizzy spell" while she was in her bedroom.  She grabbed onto the dresser, but continued to fall backwards.  Dresser landed on the table next to her with the drawers falling on her abdomen.  She denies any loss of consciousness or headache.  She does have mild neck pain as well as abdominal pain.  Reports some soreness to her bottom.  No medications given in route to the ED.  No incontinence, chest pain, SOB, vomiting.  Patient states that her PCP at her ALF has been adjusting her home medications as "dizzy spells" thought to be attributed to low BP.  The history is provided by the patient. No language interpreter was used.  Fall     Past Medical History:  Diagnosis Date  . Bronchiectasis ([place])   . Cognitive changes    mild  . Glaucoma   . Heart attack ([place])    mild  . Hypertension   . Incontinence     Patient Active Problem List   Diagnosis Date Noted  . Overactive bladder 11/04/2017  . Parkinsonism ([place]) 11/04/2017  . Recurrent falls 11/04/2017  . Left lateral knee pain 08/07/2017  . Dilated aortic root ([place]) 05/02/2017  . Angina pectoris ([place]) 05/02/2017  . Incontinence   . Benign hypertension   . Heart attack ([place])   . Cognitive changes   . Dementia without behavioral disturbance 04/10/2017  . Pain of toe 04/10/2017  . Essential hypertension 12/26/2016  . Bronchiectasis ([place]) 12/26/2016  . Glaucoma 12/26/2016  . Urinary incontinence 12/26/2016  . Chronic constipation 12/26/2016  .  Pulmonary infiltrate 11/08/2016  . Coronary artery disease with history of myocardial infarction without history of CABG 09/18/2016  . Thoracic aortic aneurysm without rupture ([place]) 08/28/2016  . Hyperlipidemia LDL goal <100 08/16/2016    Past Surgical History:  Procedure Laterality Date  . CATARACT EXTRACTION  1994  . CATARACT EXTRACTION  2006     OB History   None      Home Medications    Prior to Admission medications   Medication Sig Start Date End Date Taking? Authorizing Provider  Ascorbic Acid (VITAMIN C) 1000 MG tablet Take 1,000 mg by mouth daily.   Yes [provider]  aspirin 81 MG chewable tablet Chew 1 tablet by mouth daily.   Yes [provider]  Cholecalciferol (VITAMIN D3) 5000 units CAPS Take 1 capsule (5,000 Units total) by mouth daily. 12/26/16  Yes Reed, Tiffany L, DO  donepezil (ARICEPT) 5 MG tablet Take 1 tablet (5 mg total) by mouth at bedtime. 08/21/17  Yes Reed, Tiffany L, DO  Glucosamine-Chondroit-Vit C-Mn (GLUCOSAMINE 1500 COMPLEX PO) Take 1-2 tablets by mouth daily.   Yes [provider]  isosorbide mononitrate (IMDUR) 60 MG 24 hr tablet Take 1 tablet (60 mg total) by mouth daily. 01/23/17  Yes Reed, Tiffany L, DO  latanoprost (XALATAN) 0.005 % ophthalmic solution Place 1 drop into both eyes at bedtime.   Yes [provider]  Lutein 6  MG CAPS Take 1 capsule by mouth daily.   Yes [provider]  Multiple Vitamin (MULTIVITAMIN) tablet Take 1 tablet by mouth daily.   Yes [provider]  polyethylene glycol (MIRALAX / GLYCOLAX) packet Take 17 g by mouth daily as needed.   Yes [provider]  pravastatin (PRAVACHOL) 40 MG tablet TAKE 1 TABLET ONCE DAILY. 08/29/17  Yes Reed, Tiffany L, DO  trimethoprim (TRIMPEX) 100 MG tablet Take 1 tablet (100 mg total) by mouth daily. 06/05/17  Yes Reed, Tiffany L, DO  irbesartan (AVAPRO) 75 MG tablet Take 0.5 tablets (37.5 mg total) by mouth daily. Patient not  taking: Reported on 11/14/2017 10/30/17   Gayland Curry, DO    Family History Family History  Problem Relation Age of Onset  . Stroke Father     Social History Social History   Tobacco Use  . Smoking status: Never Smoker  . Smokeless tobacco: Never Used  Substance Use Topics  . Alcohol use: No  . Drug use: No     Allergies   Caffeine   Review of Systems Review of Systems Ten systems reviewed and are negative for acute change, except as noted in the HPI.    Physical Exam Updated Vital Signs BP 103/65   Pulse 62   Resp 14   SpO2 95%   Physical Exam  Constitutional: She is oriented to person, place, and time. She appears well-developed and well-nourished. No distress.  Nontoxic appearing and in NAD  HENT:  Head: Normocephalic and atraumatic.  No head trauma.  No battle sign or raccoon's eyes.  Eyes: Conjunctivae and EOM are normal. No scleral icterus.  Neck:  Cervical collar in place.  Cardiovascular: Normal rate, regular rhythm and intact distal pulses.  Pulmonary/Chest: Effort normal. No stridor. No respiratory distress. She has no wheezes.  Respirations even and unlabored  Abdominal:  Soft, nontender abdomen.  Musculoskeletal: Normal range of motion.  Neurological: She is alert and oriented to person, place, and time. She exhibits normal muscle tone. Coordination normal.  GCS 15.  Speech is goal oriented.  Answers questions appropriately and follows commands.  Moving all extremities spontaneously.  Skin: Skin is warm and dry. No rash noted. She is not diaphoretic. No erythema. No pallor.  No bruising to chest, abdomen, back  Psychiatric: She has a normal mood and affect. Her behavior is normal.  Nursing note and vitals reviewed.    ED Treatments / Results  Labs (all labs ordered are listed, but only abnormal results are displayed) Labs Reviewed  CBC WITH DIFFERENTIAL/PLATELET - Abnormal; Notable for the following components:      Result Value   RBC  3.86 (*)    Hemoglobin 11.9 (*)    HCT 35.3 (*)    All other components within normal limits  COMPREHENSIVE METABOLIC PANEL - Abnormal; Notable for the following components:   Glucose, Bld 124 (*)    BUN 24 (*)    Creatinine, Ser 1.11 (*)    GFR calc non Af Amer 44 (*)    GFR calc Af Amer 51 (*)    All other components within normal limits  URINALYSIS, ROUTINE W REFLEX MICROSCOPIC  I-STAT TROPONIN, ED    EKG EKG Interpretation  Date/Time:  Thursday November 14 2017 23:02:07 EDT Ventricular Rate:  81 PR Interval:    QRS Duration: 107 QT Interval:  383 QTC Calculation: 445 R Axis:   89 Text Interpretation:  Sinus rhythm Borderline right axis deviation RSR' in V1  or V2, probably normal variant No previous ECGs available Confirmed by Wandra Arthurs ([id number]) on 11/14/2017 11:06:35 PM   Radiology Dg Chest 2 View  Result Date: 11/14/2017 CLINICAL DATA:  84 year old female with fall and left hip pain. EXAM: CHEST - 2 VIEW COMPARISON:  Chest CT dated 07/27/2017 FINDINGS: Mild bronchiectatic changes. There is no focal consolidation, pleural effusion, pneumothorax. The cardiac silhouette is within normal limits. No acute osseous pathology. IMPRESSION: No active cardiopulmonary disease. Electronically Signed   By: Anner Crete M.D.   On: 11/14/2017 23:59   Ct Head Wo Contrast  Result Date: 11/14/2017 CLINICAL DATA:  84 year old female with fall and C-spine trauma. EXAM: CT HEAD WITHOUT CONTRAST CT CERVICAL SPINE WITHOUT CONTRAST TECHNIQUE: Multidetector CT imaging of the head and cervical spine was performed following the standard protocol without intravenous contrast. Multiplanar CT image reconstructions of the cervical spine were also generated. COMPARISON:  Head CT dated 01/29/2017 FINDINGS: CT HEAD FINDINGS Brain: The ventricles and sulci appropriate size for patient's age. Minimal periventricular and deep white matter chronic microvascular ischemic changes noted. There is no acute  intracranial hemorrhage. No mass effect or midline shift noted. No extra-axial fluid collection. Vascular: No hyperdense vessel or unexpected calcification. Skull: Normal. Negative for fracture or focal lesion. Sinuses/Orbits: No acute finding. Other: Multiple areas of calcification over the scalp as seen on the prior CT. CT CERVICAL SPINE FINDINGS Alignment: No acute subluxation. Skull base and vertebrae: No acute fracture. No primary bone lesion or focal pathologic process. Soft tissues and spinal canal: No prevertebral fluid or swelling. No visible canal hematoma. Disc levels: Degenerative changes with facet hypertrophy most prominent at C2-C3 on the right and C4-C5. Upper chest: Biapical subpleural scarring. Other: None IMPRESSION: 1. No acute intracranial pathology. 2. No acute/traumatic cervical spine pathology. Electronically Signed   By: Anner Crete M.D.   On: 11/14/2017 22:53   Ct Cervical Spine Wo Contrast  Result Date: 11/14/2017 CLINICAL DATA:  84 year old female with fall and C-spine trauma. EXAM: CT HEAD WITHOUT CONTRAST CT CERVICAL SPINE WITHOUT CONTRAST TECHNIQUE: Multidetector CT imaging of the head and cervical spine was performed following the standard protocol without intravenous contrast. Multiplanar CT image reconstructions of the cervical spine were also generated. COMPARISON:  Head CT dated 01/29/2017 FINDINGS: CT HEAD FINDINGS Brain: The ventricles and sulci appropriate size for patient's age. Minimal periventricular and deep white matter chronic microvascular ischemic changes noted. There is no acute intracranial hemorrhage. No mass effect or midline shift noted. No extra-axial fluid collection. Vascular: No hyperdense vessel or unexpected calcification. Skull: Normal. Negative for fracture or focal lesion. Sinuses/Orbits: No acute finding. Other: Multiple areas of calcification over the scalp as seen on the prior CT. CT CERVICAL SPINE FINDINGS Alignment: No acute subluxation.  Skull base and vertebrae: No acute fracture. No primary bone lesion or focal pathologic process. Soft tissues and spinal canal: No prevertebral fluid or swelling. No visible canal hematoma. Disc levels: Degenerative changes with facet hypertrophy most prominent at C2-C3 on the right and C4-C5. Upper chest: Biapical subpleural scarring. Other: None IMPRESSION: 1. No acute intracranial pathology. 2. No acute/traumatic cervical spine pathology. Electronically Signed   By: Anner Crete M.D.   On: 11/14/2017 22:53   Dg Hip Unilat W Or Wo Pelvis 2-3 Views Left  Result Date: 11/15/2017 CLINICAL DATA:  84 year old female with fall and left hip pain. EXAM: DG HIP (WITH OR WITHOUT PELVIS) 2-3V LEFT COMPARISON:  None. FINDINGS: No definite acute fracture or dislocation. Mild arthritic changes  of the hips. The bones are mildly osteopenic. The soft tissues are grossly unremarkable. There is moderate colonic stool burden. IMPRESSION: No acute fracture or dislocation. Electronically Signed   By: Anner Crete M.D.   On: 11/15/2017 00:01    Procedures Procedures (including critical care time)  Medications Ordered in ED Medications  ondansetron (ZOFRAN-ODT) disintegrating tablet 8 mg (8 mg Oral Not Given 11/14/17 2357)  acetaminophen (TYLENOL) tablet 650 mg (650 mg Oral Given 11/14/17 2337)  sodium chloride 0.9 % bolus 500 mL (0 mLs Intravenous Stopped 11/15/17 0047)  meclizine (ANTIVERT) tablet 12.5 mg (12.5 mg Oral Given 11/14/17 2337)     Initial Impression / Assessment and Plan / ED Course  I have reviewed the triage vital signs and the nursing notes.  Pertinent labs & imaging results that were available during my care of the patient were reviewed by me and considered in my medical decision making (see chart for details).     84 year old female presents to the emergency department following a fall.  She has been experiencing intermittent dizziness over the past few weeks.  Symptoms worsened tonight  precipitating the patient's fall.  She has a negative head and cervical spine CT.  Labs with slight AKI, but otherwise reassuring.  The patient has been hydrated in the emergency department.  Medically managed with Antivert without improvement to dizziness.  Patient is currently in assisted living.  She has the ability to escalate to skilled nursing at her existing facility.  I do not feel it is safe for the patient to be discharged at this time as she is severely unsteady with any movement and is unable to ambulate despite significant assistance.  Plan for admission with likely social work consultation in the morning.  Case discussed with Dr. Hal Hope who will admit.   Final Clinical Impressions(s) / ED Diagnoses   Final diagnoses:  Vertigo    ED Discharge Orders    None       Antonietta Breach, PA-C 11/15/17 0881    Drenda Freeze, MD 11/15/17 1515

## 2017-11-14 NOTE — ED Notes (Signed)
Bed: OU61 Expected date:  Expected time:  Means of arrival:  Comments: Ems FAll

## 2017-11-15 ENCOUNTER — Encounter (HOSPITAL_COMMUNITY): Payer: Self-pay | Admitting: Internal Medicine

## 2017-11-15 ENCOUNTER — Other Ambulatory Visit: Payer: Self-pay

## 2017-11-15 ENCOUNTER — Observation Stay (HOSPITAL_COMMUNITY): Payer: Medicare Other

## 2017-11-15 DIAGNOSIS — R42 Dizziness and giddiness: Secondary | ICD-10-CM | POA: Diagnosis not present

## 2017-11-15 DIAGNOSIS — I10 Essential (primary) hypertension: Secondary | ICD-10-CM | POA: Diagnosis not present

## 2017-11-15 DIAGNOSIS — S0990XA Unspecified injury of head, initial encounter: Secondary | ICD-10-CM | POA: Diagnosis not present

## 2017-11-15 DIAGNOSIS — D649 Anemia, unspecified: Secondary | ICD-10-CM | POA: Diagnosis present

## 2017-11-15 DIAGNOSIS — I252 Old myocardial infarction: Secondary | ICD-10-CM | POA: Diagnosis not present

## 2017-11-15 DIAGNOSIS — I2510 Atherosclerotic heart disease of native coronary artery without angina pectoris: Secondary | ICD-10-CM | POA: Diagnosis not present

## 2017-11-15 LAB — BASIC METABOLIC PANEL
ANION GAP: 8 (ref 5–15)
BUN: 22 mg/dL (ref 8–23)
CO2: 26 mmol/L (ref 22–32)
Calcium: 8.9 mg/dL (ref 8.9–10.3)
Chloride: 106 mmol/L (ref 98–111)
Creatinine, Ser: 0.9 mg/dL (ref 0.44–1.00)
GFR calc Af Amer: >60 mL/min (ref >60)
GFR calc non Af Amer: 57 mL/min — ABNORMAL LOW (ref >60)
GLUCOSE: 109 mg/dL — AB (ref 70–99)
POTASSIUM: 3.9 mmol/L (ref 3.5–5.1)
SODIUM: 140 mmol/L (ref 135–145)

## 2017-11-15 LAB — COMPREHENSIVE METABOLIC PANEL
ALBUMIN: 4 g/dL (ref 3.5–5.0)
ALT: 24 U/L (ref 0–44)
ANION GAP: 9 (ref 5–15)
AST: 32 U/L (ref 15–41)
Alkaline Phosphatase: 87 U/L (ref 38–126)
BILIRUBIN TOTAL: 0.9 mg/dL (ref 0.3–1.2)
BUN: 24 mg/dL — ABNORMAL HIGH (ref 8–23)
CO2: 25 mmol/L (ref 22–32)
Calcium: 9.3 mg/dL (ref 8.9–10.3)
Chloride: 106 mmol/L (ref 98–111)
Creatinine, Ser: 1.11 mg/dL — ABNORMAL HIGH (ref 0.44–1.00)
GFR calc Af Amer: 51 mL/min — ABNORMAL LOW (ref >60)
GFR calc non Af Amer: 44 mL/min — ABNORMAL LOW (ref >60)
GLUCOSE: 124 mg/dL — AB (ref 70–99)
POTASSIUM: 4.2 mmol/L (ref 3.5–5.1)
Sodium: 140 mmol/L (ref 135–145)
TOTAL PROTEIN: 7 g/dL (ref 6.5–8.1)

## 2017-11-15 LAB — CBC
HEMATOCRIT: 35.4 % — AB (ref 36.0–46.0)
HEMOGLOBIN: 11.7 g/dL — AB (ref 12.0–15.0)
MCH: 30.1 pg (ref 26.0–34.0)
MCHC: 33.1 g/dL (ref 30.0–36.0)
MCV: 91 fL (ref 78.0–100.0)
Platelets: 207 10*3/uL (ref 150–400)
RBC: 3.89 MIL/uL (ref 3.87–5.11)
RDW: 15.5 % (ref 11.5–15.5)
WBC: 6.3 10*3/uL (ref 4.0–10.5)

## 2017-11-15 LAB — CBC WITH DIFFERENTIAL/PLATELET
BASOS PCT: 0 %
Basophils Absolute: 0 10*3/uL (ref 0.0–0.1)
EOS PCT: 0 %
Eosinophils Absolute: 0 10*3/uL (ref 0.0–0.7)
HEMATOCRIT: 35.3 % — AB (ref 36.0–46.0)
Hemoglobin: 11.9 g/dL — ABNORMAL LOW (ref 12.0–15.0)
Lymphocytes Relative: 11 %
Lymphs Abs: 0.9 10*3/uL (ref 0.7–4.0)
MCH: 30.8 pg (ref 26.0–34.0)
MCHC: 33.7 g/dL (ref 30.0–36.0)
MCV: 91.5 fL (ref 78.0–100.0)
MONO ABS: 0.6 10*3/uL (ref 0.1–1.0)
Monocytes Relative: 7 %
NEUTROS ABS: 6.8 10*3/uL (ref 1.7–7.7)
Neutrophils Relative %: 82 %
PLATELETS: 194 10*3/uL (ref 150–400)
RBC: 3.86 MIL/uL — ABNORMAL LOW (ref 3.87–5.11)
RDW: 15.2 % (ref 11.5–15.5)
WBC: 8.3 10*3/uL (ref 4.0–10.5)

## 2017-11-15 LAB — URINALYSIS, ROUTINE W REFLEX MICROSCOPIC
BILIRUBIN URINE: NEGATIVE
Bacteria, UA: NONE SEEN
GLUCOSE, UA: NEGATIVE mg/dL
HGB URINE DIPSTICK: NEGATIVE
KETONES UR: NEGATIVE mg/dL
NITRITE: NEGATIVE
PH: 5 (ref 5.0–8.0)
Protein, ur: NEGATIVE mg/dL
Specific Gravity, Urine: 1.017 (ref 1.005–1.030)
WBC, UA: >50 WBC/hpf — ABNORMAL HIGH (ref 0–5)

## 2017-11-15 LAB — I-STAT TROPONIN, ED: Troponin i, poc: 0.01 ng/mL (ref 0.00–0.08)

## 2017-11-15 LAB — MRSA PCR SCREENING: MRSA BY PCR: NEGATIVE

## 2017-11-15 LAB — CORTISOL: Cortisol, Plasma: 10.7 ug/dL

## 2017-11-15 MED ORDER — PRAVASTATIN SODIUM 40 MG PO TABS
40.0000 mg | ORAL_TABLET | Freq: Every day | ORAL | Status: DC
Start: 2017-11-15 — End: 2017-11-17
  Administered 2017-11-15 – 2017-11-17 (×3): 40 mg via ORAL
  Filled 2017-11-15 (×2): qty 1
  Filled 2017-11-15: qty 2

## 2017-11-15 MED ORDER — ONDANSETRON HCL 4 MG PO TABS: 4.0000 mg | ORAL_TABLET | Freq: Four times a day (QID) | ORAL | Status: DC | PRN

## 2017-11-15 MED ORDER — DONEPEZIL HCL 5 MG PO TABS
5.0000 mg | ORAL_TABLET | Freq: Every day | ORAL | Status: DC
  Administered 2017-11-15 – 2017-11-16 (×2): 5 mg via ORAL
  Filled 2017-11-15 (×2): qty 1

## 2017-11-15 MED ORDER — ACETAMINOPHEN 650 MG RE SUPP: 650.0000 mg | Freq: Four times a day (QID) | RECTAL | Status: DC | PRN

## 2017-11-15 MED ORDER — ENOXAPARIN SODIUM 40 MG/0.4ML ~~LOC~~ SOLN
40.0000 mg | SUBCUTANEOUS | Status: DC
  Administered 2017-11-16 – 2017-11-17 (×2): 40 mg via SUBCUTANEOUS
  Filled 2017-11-15 (×3): qty 0.4

## 2017-11-15 MED ORDER — HYDRALAZINE HCL 25 MG PO TABS
25.0000 mg | ORAL_TABLET | Freq: Three times a day (TID) | ORAL | Status: DC
  Administered 2017-11-15 – 2017-11-17 (×6): 25 mg via ORAL
  Filled 2017-11-15 (×6): qty 1

## 2017-11-15 MED ORDER — HYDRALAZINE HCL 20 MG/ML IJ SOLN
5.0000 mg | INTRAMUSCULAR | Status: DC | PRN
  Administered 2017-11-15: 5 mg via INTRAVENOUS
  Filled 2017-11-15: qty 1

## 2017-11-15 MED ORDER — ASPIRIN 81 MG PO CHEW
81.0000 mg | CHEWABLE_TABLET | Freq: Every day | ORAL | Status: DC
  Administered 2017-11-15 – 2017-11-17 (×3): 81 mg via ORAL
  Filled 2017-11-15 (×3): qty 1

## 2017-11-15 MED ORDER — ACETAMINOPHEN 325 MG PO TABS
650.0000 mg | ORAL_TABLET | Freq: Four times a day (QID) | ORAL | Status: DC | PRN
  Administered 2017-11-15 (×2): 650 mg via ORAL
  Filled 2017-11-15 (×2): qty 2

## 2017-11-15 MED ORDER — LATANOPROST 0.005 % OP SOLN
1.0000 [drp] | Freq: Every day | OPHTHALMIC | Status: DC
  Administered 2017-11-16 (×2): 1 [drp] via OPHTHALMIC
  Filled 2017-11-15: qty 2.5

## 2017-11-15 MED ORDER — ONDANSETRON HCL 4 MG/2ML IJ SOLN: 4.0000 mg | Freq: Four times a day (QID) | INTRAMUSCULAR | Status: DC | PRN

## 2017-11-15 MED ORDER — CEFTRIAXONE SODIUM 1 G IJ SOLR
1.0000 g | INTRAMUSCULAR | Status: DC
Start: 2017-11-15 — End: 2017-11-17
  Administered 2017-11-15 – 2017-11-17 (×3): 1 g via INTRAVENOUS
  Filled 2017-11-15 (×2): qty 1
  Filled 2017-11-15: qty 10

## 2017-11-15 MED ORDER — POLYETHYLENE GLYCOL 3350 17 G PO PACK: 17.0000 g | PACK | Freq: Every day | ORAL | Status: DC | PRN

## 2017-11-15 MED ORDER — SODIUM CHLORIDE 0.9 % IV SOLN
INTRAVENOUS | Status: AC
  Administered 2017-11-15 (×2): via INTRAVENOUS

## 2017-11-15 NOTE — H&P (Signed)
History and Physical    Briseyda Fehr DZH:[id number] DOB: 1931-10-01 DOA: 11/14/2017  PCP: Gayland Curry, DO  Patient coming from: Assisted living facility.  Chief Complaint: Fall.  HPI: Kaitlyn Ortiz is a 84 y.o. female with history of CAD, hypertension, cognitive changes, bronchiectasis who has been having persistent dizziness mostly on standing over the last for many months and patient primary care physician has been changing her antihypertensive and recently placed on ARB which patient is here today was brought to the ER after patient had a fall.  Patient states he was feeling dizzy hold onto a drawer and fell backwards.  Denies losing consciousness.  Denies any chest pain or palpitations.  Patient's dizziness is mostly on standing.  ED Course: In the ER patient had CT head and C-spine chest x-ray and x-ray pelvis all of which were negative.  EKG was showing normal sinus rhythm.  Given that patient has recurrent falls with dizziness and may need higher level of care patient admitted for further observation.  UA shows features concerning for UTI.  Blood pressure is elevated on lying down.  Review of Systems: As per HPI, rest all negative.   Past Medical History:  Diagnosis Date  . Bronchiectasis ([place])   . Cognitive changes    mild  . Glaucoma   . Heart attack ([place])    mild  . Hypertension   . Incontinence     Past Surgical History:  Procedure Laterality Date  . CATARACT EXTRACTION  1994  . CATARACT EXTRACTION  2006     reports that she has never smoked. She has never used smokeless tobacco. She reports that she does not drink alcohol or use drugs.  Allergies  Allergen Reactions  . Caffeine     Family History  Problem Relation Age of Onset  . Stroke Father     Prior to Admission medications   Medication Sig Start Date End Date Taking? Authorizing Provider  Ascorbic Acid (VITAMIN C) 1000 MG tablet Take 1,000 mg by mouth daily.   Yes [provider]    aspirin 81 MG chewable tablet Chew 1 tablet by mouth daily.   Yes [provider]  Cholecalciferol (VITAMIN D3) 5000 units CAPS Take 1 capsule (5,000 Units total) by mouth daily. 12/26/16  Yes Reed, Tiffany L, DO  donepezil (ARICEPT) 5 MG tablet Take 1 tablet (5 mg total) by mouth at bedtime. 08/21/17  Yes Reed, Tiffany L, DO  Glucosamine-Chondroit-Vit C-Mn (GLUCOSAMINE 1500 COMPLEX PO) Take 1-2 tablets by mouth daily.   Yes [provider]  isosorbide mononitrate (IMDUR) 60 MG 24 hr tablet Take 1 tablet (60 mg total) by mouth daily. 01/23/17  Yes Reed, Tiffany L, DO  latanoprost (XALATAN) 0.005 % ophthalmic solution Place 1 drop into both eyes at bedtime.   Yes [provider]  Lutein 6 MG CAPS Take 1 capsule by mouth daily.   Yes [provider]  Multiple Vitamin (MULTIVITAMIN) tablet Take 1 tablet by mouth daily.   Yes [provider]  polyethylene glycol (MIRALAX / GLYCOLAX) packet Take 17 g by mouth daily as needed.   Yes [provider]  pravastatin (PRAVACHOL) 40 MG tablet TAKE 1 TABLET ONCE DAILY. 08/29/17  Yes Reed, Tiffany L, DO  trimethoprim (TRIMPEX) 100 MG tablet Take 1 tablet (100 mg total) by mouth daily. 06/05/17  Yes Reed, Tiffany L, DO  irbesartan (AVAPRO) 75 MG tablet Take 0.5 tablets (37.5 mg total) by mouth daily. Patient not taking: Reported on  11/14/2017 10/30/17   Gayland Curry, DO    Physical Exam: Vitals:   11/15/17 0030 11/15/17 0041 11/15/17 0100 11/15/17 0130  BP: (!) 159/89 (!) 159/89 119/65 103/65  Pulse: 83 73 69 62  Resp: 17 15 15 14   SpO2: 95% 94% 91% 95%      Constitutional: Moderately built and nourished. Vitals:   11/15/17 0030 11/15/17 0041 11/15/17 0100 11/15/17 0130  BP: (!) 159/89 (!) 159/89 119/65 103/65  Pulse: 83 73 69 62  Resp: 17 15 15 14   SpO2: 95% 94% 91% 95%   Eyes: Anicteric no pallor. ENMT: No discharge from the ears eyes nose or mouth. Neck: No mass or.  No neck rigidity.  No JVD  appreciated. Respiratory: No rhonchi or crepitations. Cardiovascular: S1-S2 heard no murmurs appreciated. Abdomen: Soft nontender bowel sounds present. Musculoskeletal: No edema.  No joint effusion. Skin: No rash. Neurologic: Alert awake oriented to time place and person.  Moves all extremities 5 x 5. Psychiatric: Appears normal per normal affect.   Labs on Admission: I have personally reviewed following labs and imaging studies  CBC: Recent Labs  Lab 11/14/17 2357  WBC 8.3  NEUTROABS 6.8  HGB 11.9*  HCT 35.3*  MCV 91.5  PLT 119   Basic Metabolic Panel: Recent Labs  Lab 11/14/17 2357  NA 140  K 4.2  CL 106  CO2 25  GLUCOSE 124*  BUN 24*  CREATININE 1.11*  CALCIUM 9.3   GFR: CrCl cannot be calculated (Unknown ideal weight.). Liver Function Tests: Recent Labs  Lab 11/14/17 2357  AST 32  ALT 24  ALKPHOS 87  BILITOT 0.9  PROT 7.0  ALBUMIN 4.0   No results for input(s): LIPASE, AMYLASE in the last 168 hours. No results for input(s): AMMONIA in the last 168 hours. Coagulation Profile: No results for input(s): INR, PROTIME in the last 168 hours. Cardiac Enzymes: No results for input(s): CKTOTAL, CKMB, CKMBINDEX, TROPONINI in the last 168 hours. BNP (last 3 results) No results for input(s): PROBNP in the last 8760 hours. HbA1C: No results for input(s): HGBA1C in the last 72 hours. CBG: No results for input(s): GLUCAP in the last 168 hours. Lipid Profile: No results for input(s): CHOL, HDL, LDLCALC, TRIG, CHOLHDL, LDLDIRECT in the last 72 hours. Thyroid Function Tests: No results for input(s): TSH, T4TOTAL, FREET4, T3FREE, THYROIDAB in the last 72 hours. Anemia Panel: No results for input(s): VITAMINB12, FOLATE, FERRITIN, TIBC, IRON, RETICCTPCT in the last 72 hours. Urine analysis:    Component Value Date/Time   COLORURINE YELLOW 11/15/2017 0211   APPEARANCEUR CLOUDY (A) 11/15/2017 0211   LABSPEC 1.017 11/15/2017 0211   PHURINE 5.0 11/15/2017 0211    GLUCOSEU NEGATIVE 11/15/2017 0211   HGBUR NEGATIVE 11/15/2017 0211   BILIRUBINUR NEGATIVE 11/15/2017 0211   KETONESUR NEGATIVE 11/15/2017 0211   PROTEINUR NEGATIVE 11/15/2017 0211   NITRITE NEGATIVE 11/15/2017 0211   LEUKOCYTESUR LARGE (A) 11/15/2017 0211   Sepsis Labs: @LABRCNTIP (procalcitonin:4,lacticidven:4) )No results found for this or any previous visit (from the past 240 hour(s)).   Radiological Exams on Admission: Dg Chest 2 View  Result Date: 11/14/2017 CLINICAL DATA:  84 year old female with fall and left hip pain. EXAM: CHEST - 2 VIEW COMPARISON:  Chest CT dated 07/27/2017 FINDINGS: Mild bronchiectatic changes. There is no focal consolidation, pleural effusion, pneumothorax. The cardiac silhouette is within normal limits. No acute osseous pathology. IMPRESSION: No active cardiopulmonary disease. Electronically Signed   By: Anner Crete M.D.   On: 11/14/2017 23:59  Ct Head Wo Contrast  Result Date: 11/14/2017 CLINICAL DATA:  84 year old female with fall and C-spine trauma. EXAM: CT HEAD WITHOUT CONTRAST CT CERVICAL SPINE WITHOUT CONTRAST TECHNIQUE: Multidetector CT imaging of the head and cervical spine was performed following the standard protocol without intravenous contrast. Multiplanar CT image reconstructions of the cervical spine were also generated. COMPARISON:  Head CT dated 01/29/2017 FINDINGS: CT HEAD FINDINGS Brain: The ventricles and sulci appropriate size for patient's age. Minimal periventricular and deep white matter chronic microvascular ischemic changes noted. There is no acute intracranial hemorrhage. No mass effect or midline shift noted. No extra-axial fluid collection. Vascular: No hyperdense vessel or unexpected calcification. Skull: Normal. Negative for fracture or focal lesion. Sinuses/Orbits: No acute finding. Other: Multiple areas of calcification over the scalp as seen on the prior CT. CT CERVICAL SPINE FINDINGS Alignment: No acute subluxation. Skull base  and vertebrae: No acute fracture. No primary bone lesion or focal pathologic process. Soft tissues and spinal canal: No prevertebral fluid or swelling. No visible canal hematoma. Disc levels: Degenerative changes with facet hypertrophy most prominent at C2-C3 on the right and C4-C5. Upper chest: Biapical subpleural scarring. Other: None IMPRESSION: 1. No acute intracranial pathology. 2. No acute/traumatic cervical spine pathology. Electronically Signed   By: Anner Crete M.D.   On: 11/14/2017 22:53   Ct Cervical Spine Wo Contrast  Result Date: 11/14/2017 CLINICAL DATA:  84 year old female with fall and C-spine trauma. EXAM: CT HEAD WITHOUT CONTRAST CT CERVICAL SPINE WITHOUT CONTRAST TECHNIQUE: Multidetector CT imaging of the head and cervical spine was performed following the standard protocol without intravenous contrast. Multiplanar CT image reconstructions of the cervical spine were also generated. COMPARISON:  Head CT dated 01/29/2017 FINDINGS: CT HEAD FINDINGS Brain: The ventricles and sulci appropriate size for patient's age. Minimal periventricular and deep white matter chronic microvascular ischemic changes noted. There is no acute intracranial hemorrhage. No mass effect or midline shift noted. No extra-axial fluid collection. Vascular: No hyperdense vessel or unexpected calcification. Skull: Normal. Negative for fracture or focal lesion. Sinuses/Orbits: No acute finding. Other: Multiple areas of calcification over the scalp as seen on the prior CT. CT CERVICAL SPINE FINDINGS Alignment: No acute subluxation. Skull base and vertebrae: No acute fracture. No primary bone lesion or focal pathologic process. Soft tissues and spinal canal: No prevertebral fluid or swelling. No visible canal hematoma. Disc levels: Degenerative changes with facet hypertrophy most prominent at C2-C3 on the right and C4-C5. Upper chest: Biapical subpleural scarring. Other: None IMPRESSION: 1. No acute intracranial pathology.  2. No acute/traumatic cervical spine pathology. Electronically Signed   By: Anner Crete M.D.   On: 11/14/2017 22:53   Dg Hip Unilat W Or Wo Pelvis 2-3 Views Left  Result Date: 11/15/2017 CLINICAL DATA:  84 year old female with fall and left hip pain. EXAM: DG HIP (WITH OR WITHOUT PELVIS) 2-3V LEFT COMPARISON:  None. FINDINGS: No definite acute fracture or dislocation. Mild arthritic changes of the hips. The bones are mildly osteopenic. The soft tissues are grossly unremarkable. There is moderate colonic stool burden. IMPRESSION: No acute fracture or dislocation. Electronically Signed   By: Anner Crete M.D.   On: 11/15/2017 00:01    EKG: Independently reviewed.  Normal sinus rhythm.  Assessment/Plan Principal Problem:   Dizziness Active Problems:   Essential hypertension   Coronary artery disease with history of myocardial infarction without history of CABG   Thoracic aortic aneurysm without rupture (HCC)   Dementia without behavioral disturbance   Normocytic normochromic anemia  1. Dizziness and falls -we will check orthostatic and MRI brain.  Get physical therapy consult.  Gently hydrate for now.  Check cortisol level. 2. Uncontrolled hypertension -patient is on hematuria at this time.  Closely observe blood pressure trends will keep patient on PRN IV hydralazine if blood pressure more than 354 systolic.  3. UTI on ceftriaxone follow urine cultures. 4. History of CAD on Imdur antiplatelet agents and statins. 5. Mild cognitive deficiency on Aricept. 6. Thoracic aortic aneurysm denies any chest pain. 7. Acute renal failure likely from hypertension and possible dehydration gently hydrate.  Not sure if patient has been using ARB.   DVT prophylaxis: Lovenox. Code Status: DNR. Family Communication: Discussed with patient. Disposition Plan: May need rehab or skilled nursing facility. Consults called: Physical therapy. Admission status: Observation.   Rise Patience  MD [facility] Pager [phone].  If 7PM-7AM, please contact night-coverage [email or web address] Password [facility]  11/15/2017, 3:18 AM

## 2017-11-15 NOTE — Progress Notes (Signed)
PROGRESS NOTE    Patient: Kaitlyn Ortiz     PCP: Gayland Curry, DO                    DOB: 07-May-1931            DOA: 11/14/2017 ZJI:[id number]             DOS: 11/15/2017, 10:49 AM   LOS: 0 days   Date of Service: The patient was seen and examined on 11/15/2017  Subjective:  Patient was seen and examined this morning, stable no acute distress.  No issues overnight.  ------------------------------------------------------------------------------------------------------------------------------------------------------------  Brief Narrative:   Kaitlyn Ortiz is a 84 y.o. female with history of CAD, hypertension, cognitive changes, bronchiectasis who has been having persistent dizziness mostly on standing over the last for many months and patient primary care physician has been changing her antihypertensive and recently placed on ARB which patient is here today was brought to the ER after patient had a fall.   Patient states he was feeling dizzy hold onto a drawer and fell backwards.  Denies losing consciousness.  Denies any chest pain or palpitations.  Patient's dizziness is mostly on standing.  CT head and C-spine chest x-ray and x-ray pelvis all of which were negative.  EKG was showing normal sinus rhythm.  UA shows features concerning for UTI.  Blood pressure is elevated on lying down.   ------------------------------------------------------------------------------------------------------------------------------------------------------------  Principal Problem:   Dizziness Active Problems:   Essential hypertension   Coronary artery disease with history of myocardial infarction without history of CABG   Thoracic aortic aneurysm without rupture (HCC)   Dementia without behavioral disturbance   Normocytic normochromic anemia   Assessment & Plan:    Dizziness and falls -MRI of the brain was essentially negative, chronic changes, consistent with her age vascular  changes -Pending routine orthostatic blood pressure check -Consulting PT/OT for evaluation recommendations -Continue gentle IV fluid hydration -Cortisol level   Uncontrolled Hypertension  -She was hypertensive on admission, on hydralazine -her home medication of ARB, was held on admission due to: mild AKI, mild dehydration, dizziness -Blood pressure improved to 149/80 this morning   UTI  - on ceftriaxone follow urine cultures.  History of CAD - on Imdur, ASA, and statins.  Mild cognitive deficiency -  on Aricept.  H/o Thoracic aortic aneurysm denies any chest pain.  Acute renal failure l - ikely from hypertension and possible dehydration gently hydrate.   Not sure if patient has been using ARB, on hold now   DVT prophylaxis: Lovenox. Code Status: DNR. Family Communication: Discussed with patient. Disposition Plan: May need rehab or skilled nursing facility. Consults called: Physical therapy. Admission status: Observation.       Antimicrobials:  Anti-infectives (From admission, onward)   Start     Dose/Rate Route Frequency Ordered Stop   11/15/17 0630  cefTRIAXone (ROCEPHIN) 1 g in sodium chloride 0.9 % 100 mL IVPB     1 g 200 mL/hr over 30 Minutes Intravenous Every 24 hours 11/15/17 0617         Objective: Vitals:   11/15/17 0559 11/15/17 0630 11/15/17 0700 11/15/17 0800  BP: (!) 183/102 (!) 169/114 117/74 (!) 149/80  Pulse: 93 73 63 60  Resp: 16 17 17 15   SpO2: 98% 97% 93% 97%    Intake/Output Summary (Last 24 hours) at 11/15/2017 1049 Last data filed at 11/15/2017 0047 Gross per 24 hour  Intake 500.2 ml  Output -  Net 500.2 ml  There were no vitals filed for this visit.  Examination:  General exam: Appears calm and comfortable  Psychiatry: Judgement and insight appear normal. Mood & affect appropriate. HEENT: WNLs Respiratory system: Clear to auscultation. Respiratory effort normal. Cardiovascular system: S1 & S2 heard, RRR. No JVD,  murmurs, rubs, gallops or clicks. No pedal edema. Gastrointestinal system: Abd. nondistended, soft and nontender. No organomegaly or masses felt. Normal bowel sounds heard. Central nervous system: Alert and oriented. No focal neurological deficits. Extremities: Symmetric 5 x 5 power. Skin: No rashes, lesions or ulcers Wounds:   Data Reviewed: I have personally reviewed following labs and imaging studies  CBC: Recent Labs  Lab 11/14/17 2357 11/15/17 0448  WBC 8.3 6.3  NEUTROABS 6.8  --   HGB 11.9* 11.7*  HCT 35.3* 35.4*  MCV 91.5 91.0  PLT 194 119   Basic Metabolic Panel: Recent Labs  Lab 11/14/17 2357 11/15/17 0448  NA 140 140  K 4.2 3.9  CL 106 106  CO2 25 26  GLUCOSE 124* 109*  BUN 24* 22  CREATININE 1.11* 0.90  CALCIUM 9.3 8.9   GFR: CrCl cannot be calculated (Unknown ideal weight.). Liver Function Tests: Recent Labs  Lab 11/14/17 2357  AST 32  ALT 24  ALKPHOS 87  BILITOT 0.9  PROT 7.0  ALBUMIN 4.0  No results found for this or any previous visit (from the past 240 hour(s)).    Radiology Studies: Dg Chest 2 View  Result Date: 11/14/2017 CLINICAL DATA:  84 year old female with fall and left hip pain. EXAM: CHEST - 2 VIEW COMPARISON:  Chest CT dated 07/27/2017 FINDINGS: Mild bronchiectatic changes. There is no focal consolidation, pleural effusion, pneumothorax. The cardiac silhouette is within normal limits. No acute osseous pathology. IMPRESSION: No active cardiopulmonary disease. Electronically Signed   By: Anner Crete M.D.   On: 11/14/2017 23:59   Ct Head Wo Contrast  Result Date: 11/14/2017 CLINICAL DATA:  84 year old female with fall and C-spine trauma. EXAM: CT HEAD WITHOUT CONTRAST CT CERVICAL SPINE WITHOUT CONTRAST TECHNIQUE: Multidetector CT imaging of the head and cervical spine was performed following the standard protocol without intravenous contrast. Multiplanar CT image reconstructions of the cervical spine were also generated. COMPARISON:   Head CT dated 01/29/2017 FINDINGS: CT HEAD FINDINGS Brain: The ventricles and sulci appropriate size for patient's age. Minimal periventricular and deep white matter chronic microvascular ischemic changes noted. There is no acute intracranial hemorrhage. No mass effect or midline shift noted. No extra-axial fluid collection. Vascular: No hyperdense vessel or unexpected calcification. Skull: Normal. Negative for fracture or focal lesion. Sinuses/Orbits: No acute finding. Other: Multiple areas of calcification over the scalp as seen on the prior CT. CT CERVICAL SPINE FINDINGS Alignment: No acute subluxation. Skull base and vertebrae: No acute fracture. No primary bone lesion or focal pathologic process. Soft tissues and spinal canal: No prevertebral fluid or swelling. No visible canal hematoma. Disc levels: Degenerative changes with facet hypertrophy most prominent at C2-C3 on the right and C4-C5. Upper chest: Biapical subpleural scarring. Other: None IMPRESSION: 1. No acute intracranial pathology. 2. No acute/traumatic cervical spine pathology. Electronically Signed   By: Anner Crete M.D.   On: 11/14/2017 22:53   Ct Cervical Spine Wo Contrast  Result Date: 11/14/2017 CLINICAL DATA:  84 year old female with fall and C-spine trauma. EXAM: CT HEAD WITHOUT CONTRAST CT CERVICAL SPINE WITHOUT CONTRAST TECHNIQUE: Multidetector CT imaging of the head and cervical spine was performed following the standard protocol without intravenous contrast. Multiplanar CT image reconstructions  of the cervical spine were also generated. COMPARISON:  Head CT dated 01/29/2017 FINDINGS: CT HEAD FINDINGS Brain: The ventricles and sulci appropriate size for patient's age. Minimal periventricular and deep white matter chronic microvascular ischemic changes noted. There is no acute intracranial hemorrhage. No mass effect or midline shift noted. No extra-axial fluid collection. Vascular: No hyperdense vessel or unexpected calcification.  Skull: Normal. Negative for fracture or focal lesion. Sinuses/Orbits: No acute finding. Other: Multiple areas of calcification over the scalp as seen on the prior CT. CT CERVICAL SPINE FINDINGS Alignment: No acute subluxation. Skull base and vertebrae: No acute fracture. No primary bone lesion or focal pathologic process. Soft tissues and spinal canal: No prevertebral fluid or swelling. No visible canal hematoma. Disc levels: Degenerative changes with facet hypertrophy most prominent at C2-C3 on the right and C4-C5. Upper chest: Biapical subpleural scarring. Other: None IMPRESSION: 1. No acute intracranial pathology. 2. No acute/traumatic cervical spine pathology. Electronically Signed   By: Anner Crete M.D.   On: 11/14/2017 22:53   Mr Brain Wo Contrast  Result Date: 11/15/2017 CLINICAL DATA:  Dizziness for the last several weeks. Fell last night with trauma to the head. EXAM: MRI HEAD WITHOUT CONTRAST TECHNIQUE: Multiplanar, multiecho pulse sequences of the brain and surrounding structures were obtained without intravenous contrast. COMPARISON:  CT studies done yesterday. FINDINGS: Brain: Diffusion imaging does not show any acute or subacute infarction. The brainstem and cerebellum are normal. Cerebral hemispheres show age related atrophy with mild moderate small vessel change of the white matter. No cortical or large vessel territory infarction. No mass lesion, hemorrhage, hydrocephalus or extra-axial collection. Vascular: Major vessels at the base of the brain show flow. Skull and upper cervical spine: Negative Sinuses/Orbits: Clear/normal. No fluid in the mastoids or middle ears. Other: Presumed sebaceous cysts of the scalp. IMPRESSION: No acute or reversible finding. Age related volume loss. Mild to moderate chronic small-vessel ischemic changes of the cerebral hemispheric white matter. Electronically Signed   By: Nelson Chimes M.D.   On: 11/15/2017 09:03   Dg Hip Unilat W Or Wo Pelvis 2-3 Views  Left  Result Date: 11/15/2017 CLINICAL DATA:  84 year old female with fall and left hip pain. EXAM: DG HIP (WITH OR WITHOUT PELVIS) 2-3V LEFT COMPARISON:  None. FINDINGS: No definite acute fracture or dislocation. Mild arthritic changes of the hips. The bones are mildly osteopenic. The soft tissues are grossly unremarkable. There is moderate colonic stool burden. IMPRESSION: No acute fracture or dislocation. Electronically Signed   By: Anner Crete M.D.   On: 11/15/2017 00:01    Scheduled Meds: . aspirin  81 mg Oral Daily  . donepezil  5 mg Oral QHS  . enoxaparin (LOVENOX) injection  40 mg Subcutaneous Q24H  . latanoprost  1 drop Both Eyes QHS  . pravastatin  40 mg Oral Daily   Continuous Infusions: . sodium chloride 75 mL/hr at 11/15/17 0435  . cefTRIAXone (ROCEPHIN)  IV Stopped (11/15/17 0355)    Time spent: >25 minutes  Deatra James, MD [facility],  Pager [phone]  If 7PM-7AM, please contact night-coverage [email or web address]   Password [facility]  11/15/2017, 10:49 AM

## 2017-11-15 NOTE — Evaluation (Signed)
Physical Therapy Evaluation Patient Details Name: Kaitlyn Ortiz MRN: [id number] DOB: [birth year] Today's Date: 11/15/2017   History of Present Illness  Kaitlyn Ortiz is a 84 y.o. female with history of CAD, hypertension, cognitive changes, bronchiectasis who has been having persistent dizziness and falls.  Clinical Impression  Patient presents with decreased independence and safety with mobility.  Limited by balance, dizziness at times (symptoms point to possible central cause,) decreased safety awareness and decreased awareness of deficits.  Feel she would be safest in SNF level rehab at d/c if able to qualify.  Would need HHPT and more frequent observation/assist if goes back to ALF.  PT to follow if remains inpatient.     Follow Up Recommendations Home health PT;SNF    Equipment Recommendations  None recommended by PT    Recommendations for Other Services       Precautions / Restrictions Precautions Precautions: Fall      Mobility  Bed Mobility Overal bed mobility: Needs Assistance Bed Mobility: Sidelying to Sit;Sit to Supine   Sidelying to sit: Min assist;HOB elevated   Sit to supine: Max assist   General bed mobility comments: assist for legs and trunk to supine on high gurney due to pt unable to scoot far enough back nor lift legs to higher surface  Transfers Overall transfer level: Needs assistance Equipment used: Rolling walker (2 wheeled) Transfers: Sit to/from [facility] Sit to Stand: Supervision;Min guard;From elevated surface Stand pivot transfers: Min assist       General transfer comment: assist for safety; pivot to/from [facility] with cues  Ambulation/Gait Ambulation/Gait assistance: Min guard;Min assist Gait Distance (Feet): 200 Feet Assistive device: Rolling walker (2 wheeled) Gait Pattern/deviations: Step-through pattern;Decreased stride length     General Gait Details: walker veering left at times, pt used to rollator so cues and  at times assist for walker  Stairs            Wheelchair Mobility    Modified Rankin (Stroke Patients Only)       Balance Overall balance assessment: Needs assistance Sitting-balance support: Feet unsupported Sitting balance-Leahy Scale: Fair     Standing balance support: No upper extremity supported Standing balance-Leahy Scale: Fair Standing balance comment: standing beside bed no UE support static, with function (peri hygiene after toileting) assist for balance                             Pertinent Vitals/Pain Pain Assessment: Faces Faces Pain Scale: Hurts a little bit Pain Location: generalized soreness from falling Pain Descriptors / Indicators: Sore Pain Intervention(s): Monitored during session;Repositioned    Home Living Family/patient expects to be discharged to:: Assisted living([facility])               Home Equipment: Walker - 4 wheels      Prior Function Level of Independence: Independent with assistive device(s)         Comments: meals in dining facility, assist to shower and for medications     Hand Dominance        Extremity/Trunk Assessment   Upper Extremity Assessment Upper Extremity Assessment: Generalized weakness    Lower Extremity Assessment Lower Extremity Assessment: Generalized weakness       Communication   Communication: HOH  Cognition Arousal/Alertness: Awake/alert Behavior During Therapy: WFL for tasks assessed/performed Overall Cognitive Status: History of cognitive impairments - at baseline  General Comments: likely at baseline, for the most part oriented, but at times thinks she is at [place]    Vestibular Assessment - 11/15/17 1418      Vestibular Assessment   General Observation  reports about 2 month history of dizziness, episodic at times spinning, at times woozy.  Has had falls, unable to state how many (one in  bathroom, one in dining hall, one in bedroom)      Symptom Behavior   Type of Dizziness  Imbalance and spinning and lightheaded    Frequency of Dizziness  intermittent (daily)    Duration of Dizziness  30 seconds    Aggravating Factors  Sit to stand starting to walk    Relieving Factors  Rest      Occulomotor Exam   Occulomotor Alignment  Normal    Spontaneous  Absent    Gaze-induced  Direction changing nystagmus    Smooth Pursuits  Saccades    Saccades  Intact;Slow      Vestibulo-Occular Reflex   VOR 1 Head Only (x 1 viewing)  difficulty with head turning, needed assist    VOR to Slow Head Movement  Positive right      Auditory   Comments  decreased hearing bilaterally, worse on R      Positional Testing   Sidelying Test  Sidelying Right;Sidelying Left    Horizontal Canal Testing  Horizontal Canal Right;Horizontal Canal Left      Sidelying Right   Sidelying Right Duration  30s    Sidelying Right Symptoms  No nystagmus      Sidelying Left   Sidelying Left Duration  30s    Sidelying Left Symptoms  No nystagmus      Horizontal Canal Right   Horizontal Canal Right Duration  30s    Horizontal Canal Right Symptoms  Normal      Horizontal Canal Left   Horizontal Canal Left Duration  30s    Horizontal Canal Left Symptoms  Normal         Exercises     Assessment/Plan    PT Assessment Patient needs continued PT services  PT Problem List Decreased mobility;Decreased activity tolerance;Decreased balance;Decreased knowledge of use of DME;Decreased safety awareness;Other (comment)(dizziness)       PT Treatment Interventions DME instruction;Therapeutic activities;Gait training;Therapeutic exercise;Balance training;Functional mobility training;Other (comment)(vestibular rehab)    PT Goals (Current goals can be found in the Care Plan section)  Acute Rehab PT Goals Patient Stated Goal: unsure if needs to go to rehab PT Goal Formulation: With patient Time For Goal  Achievement: 11/22/17 Potential to Achieve Goals: Good    Frequency Min 3X/week   Barriers to discharge        Co-evaluation               AM-PAC PT "6 Clicks" Daily Activity  Outcome Measure Difficulty turning over in bed (including adjusting bedclothes, sheets and blankets)?: A Lot Difficulty moving from lying on back to sitting on the side of the bed? : Unable Difficulty sitting down on and standing up from a chair with arms (e.g., wheelchair, bedside commode, etc,.)?: Unable Help needed moving to and from a bed to chair (including a wheelchair)?: A Little Help needed walking in hospital room?: A Little Help needed climbing 3-5 steps with a railing? : A Lot 6 Click Score: 12    End of Session Equipment Utilized During Treatment: Gait belt Activity Tolerance: Patient tolerated treatment well Patient left:  in bed;with call bell/phone within reach Nurse Communication: Mobility status PT Visit Diagnosis: Other abnormalities of gait and mobility (R26.89);History of falling (Z91.81);Dizziness and giddiness (R42)    Time: 1215-1300 PT Time Calculation (min) (ACUTE ONLY): 45 min   Charges:   PT Evaluation $PT Eval Moderate Complexity: 1 Mod PT Treatments $Gait Training: 8-22 mins $Neuromuscular Re-education: 8-22 mins   PT G CodesMagda Kiel, [place] [phone] 11/15/2017   Reginia Naas 11/15/2017, 2:23 PM

## 2017-11-15 NOTE — ED Notes (Signed)
ED TO INPATIENT HANDOFF REPORT  Name/Age/Gender Kaitlyn Ortiz 84 y.o. female  Code Status    Code Status Orders  (From admission, onward)        Start     Ordered   11/15/17 0317  Do not attempt resuscitation (DNR)  Continuous    Question Answer Comment  In the event of cardiac or respiratory ARREST Do not call a "code blue"   In the event of cardiac or respiratory ARREST Do not perform Intubation, CPR, defibrillation or ACLS   In the event of cardiac or respiratory ARREST Use medication by any route, position, wound care, and other measures to relive pain and suffering. May use oxygen, suction and manual treatment of airway obstruction as needed for comfort.      11/15/17 0318    Code Status History    Date Active Date Inactive Code Status Order ID Comments User Context   02/06/2017 1628 11/14/2017 2158 DNR [id number] Discussed at clinic visit, scanned copy should be in documents and media Gayland Curry, DO Outpatient    Advance Directive Documentation     Most Recent Value  Type of Advance Directive  Healthcare Power of Attorney, Out of facility DNR (pink MOST or yellow form)  Pre-existing out of facility DNR order (yellow form or pink MOST form)  Yellow form placed in chart (order not valid for inpatient use)  "MOST" Form in Place?  -      Home/SNF/Other Nursing Home  Chief Complaint FALL  Level of Care/Admitting Diagnosis ED Disposition    ED Disposition Condition [place]: [place] [100102]  Level of Care: Telemetry [5]  Admit to tele based on following criteria: Monitor for Ischemic changes  Diagnosis: Dizziness [076226]  Admitting Physician: Rise Patience [phone]  Attending Physician: Rise Patience [[id number]]  PT Class (Do Not Modify): Observation [104]  PT Acc Code (Do Not Modify): Observation [10022]       Medical History Past Medical History:  Diagnosis Date  . Bronchiectasis ([place])   .  Cognitive changes    mild  . Glaucoma   . Heart attack ([place])    mild  . Hypertension   . Incontinence     Allergies Allergies  Allergen Reactions  . Caffeine     IV Location/Drains/Wounds Patient Lines/Drains/Airways Status   Active Line/Drains/Airways    Name:   Placement date:   Placement time:   Site:   Days:   Peripheral IV 11/14/17 Right Arm   11/14/17    2209    Arm   1          Labs/Imaging Results for orders placed or performed during the hospital encounter of 11/14/17 (from the past 48 hour(s))  CBC with Differential/Platelet     Status: Abnormal   Collection Time: 11/14/17 11:57 PM  Result Value Ref Range   WBC 8.3 4.0 - 10.5 K/uL   RBC 3.86 (L) 3.87 - 5.11 MIL/uL   Hemoglobin 11.9 (L) 12.0 - 15.0 g/dL   HCT 35.3 (L) 36.0 - 46.0 %   MCV 91.5 78.0 - 100.0 fL   MCH 30.8 26.0 - 34.0 pg   MCHC 33.7 30.0 - 36.0 g/dL   RDW 15.2 11.5 - 15.5 %   Platelets 194 150 - 400 K/uL   Neutrophils Relative % 82 %   Neutro Abs 6.8 1.7 - 7.7 K/uL   Lymphocytes Relative 11 %   Lymphs Abs 0.9 0.7 -  4.0 K/uL   Monocytes Relative 7 %   Monocytes Absolute 0.6 0.1 - 1.0 K/uL   Eosinophils Relative 0 %   Eosinophils Absolute 0.0 0.0 - 0.7 K/uL   Basophils Relative 0 %   Basophils Absolute 0.0 0.0 - 0.1 K/uL    Comment: Performed at [facility], [place] [address], [place], [place] [address]  Comprehensive metabolic panel     Status: Abnormal   Collection Time: 11/14/17 11:57 PM  Result Value Ref Range   Sodium 140 135 - 145 mmol/L   Potassium 4.2 3.5 - 5.1 mmol/L   Chloride 106 98 - 111 mmol/L    Comment: Please note change in reference range.   CO2 25 22 - 32 mmol/L   Glucose, Bld 124 (H) 70 - 99 mg/dL    Comment: Please note change in reference range.   BUN 24 (H) 8 - 23 mg/dL    Comment: Please note change in reference range.   Creatinine, Ser 1.11 (H) 0.44 - 1.00 mg/dL   Calcium 9.3 8.9 - 10.3 mg/dL   Total Protein 7.0 6.5 - 8.1 g/dL   Albumin 4.0  3.5 - 5.0 g/dL   AST 32 15 - 41 U/L   ALT 24 0 - 44 U/L    Comment: Please note change in reference range.   Alkaline Phosphatase 87 38 - 126 U/L   Total Bilirubin 0.9 0.3 - 1.2 mg/dL   GFR calc non Af Amer 44 (L) >60 mL/min   GFR calc Af Amer 51 (L) >60 mL/min    Comment: (NOTE) The eGFR has been calculated using the CKD EPI equation. This calculation has not been validated in all clinical situations. eGFR's persistently <60 mL/min signify possible Chronic Kidney Disease.    Anion gap 9 5 - 15    Comment: Performed at [facility], [place] [address], [place], [place] [address]  I-stat troponin, ED     Status: None   Collection Time: 11/15/17 12:01 AM  Result Value Ref Range   Troponin i, poc 0.01 0.00 - 0.08 ng/mL   Comment 3            Comment: Due to the release kinetics of cTnI, a negative result within the first hours of the onset of symptoms does not rule out myocardial infarction with certainty. If myocardial infarction is still suspected, repeat the test at appropriate intervals.   Urinalysis, Routine w reflex microscopic     Status: Abnormal   Collection Time: 11/15/17  2:11 AM  Result Value Ref Range   Color, Urine YELLOW YELLOW   APPearance CLOUDY (A) CLEAR   Specific Gravity, Urine 1.017 1.005 - 1.030   pH 5.0 5.0 - 8.0   Glucose, UA NEGATIVE NEGATIVE mg/dL   Hgb urine dipstick NEGATIVE NEGATIVE   Bilirubin Urine NEGATIVE NEGATIVE   Ketones, ur NEGATIVE NEGATIVE mg/dL   Protein, ur NEGATIVE NEGATIVE mg/dL   Nitrite NEGATIVE NEGATIVE   Leukocytes, UA LARGE (A) NEGATIVE   RBC / HPF 6-10 0 - 5 RBC/hpf   WBC, UA >50 (H) 0 - 5 WBC/hpf   Bacteria, UA NONE SEEN NONE SEEN   Mucus PRESENT    Hyaline Casts, UA PRESENT     Comment: Performed at [facility], [place] [address], [place], [place] [address]  Basic metabolic panel     Status: Abnormal   Collection Time: 11/15/17  4:48 AM  Result Value Ref Range   Sodium 140 135 -  145  mmol/L   Potassium 3.9 3.5 - 5.1 mmol/L   Chloride 106 98 - 111 mmol/L    Comment: Please note change in reference range.   CO2 26 22 - 32 mmol/L   Glucose, Bld 109 (H) 70 - 99 mg/dL    Comment: Please note change in reference range.   BUN 22 8 - 23 mg/dL    Comment: Please note change in reference range.   Creatinine, Ser 0.90 0.44 - 1.00 mg/dL   Calcium 8.9 8.9 - 10.3 mg/dL   GFR calc non Af Amer 57 (L) >60 mL/min   GFR calc Af Amer >60 >60 mL/min    Comment: (NOTE) The eGFR has been calculated using the CKD EPI equation. This calculation has not been validated in all clinical situations. eGFR's persistently <60 mL/min signify possible Chronic Kidney Disease.    Anion gap 8 5 - 15    Comment: Performed at [facility], [place] [address], [place], [place] [address]  CBC     Status: Abnormal   Collection Time: 11/15/17  4:48 AM  Result Value Ref Range   WBC 6.3 4.0 - 10.5 K/uL   RBC 3.89 3.87 - 5.11 MIL/uL   Hemoglobin 11.7 (L) 12.0 - 15.0 g/dL   HCT 35.4 (L) 36.0 - 46.0 %   MCV 91.0 78.0 - 100.0 fL   MCH 30.1 26.0 - 34.0 pg   MCHC 33.1 30.0 - 36.0 g/dL   RDW 15.5 11.5 - 15.5 %   Platelets 207 150 - 400 K/uL    Comment: Performed at [facility], [place] [address], [place], [place] [address]  Cortisol     Status: None   Collection Time: 11/15/17  8:18 AM  Result Value Ref Range   Cortisol, Plasma 10.7 ug/dL    Comment: (NOTE) AM    6.7 - 22.6 ug/dL PM   <10.0       ug/dL Performed at [place] [address], [place], [place] [address]    Dg Chest 2 View  Result Date: 11/14/2017 CLINICAL DATA:  84 year old female with fall and left hip pain. EXAM: CHEST - 2 VIEW COMPARISON:  Chest CT dated 07/27/2017 FINDINGS: Mild bronchiectatic changes. There is no focal consolidation, pleural effusion, pneumothorax. The cardiac silhouette is within normal limits. No acute osseous pathology. IMPRESSION: No active cardiopulmonary disease.  Electronically Signed   By: Anner Crete M.D.   On: 11/14/2017 23:59   Ct Head Wo Contrast  Result Date: 11/14/2017 CLINICAL DATA:  84 year old female with fall and C-spine trauma. EXAM: CT HEAD WITHOUT CONTRAST CT CERVICAL SPINE WITHOUT CONTRAST TECHNIQUE: Multidetector CT imaging of the head and cervical spine was performed following the standard protocol without intravenous contrast. Multiplanar CT image reconstructions of the cervical spine were also generated. COMPARISON:  Head CT dated 01/29/2017 FINDINGS: CT HEAD FINDINGS Brain: The ventricles and sulci appropriate size for patient's age. Minimal periventricular and deep white matter chronic microvascular ischemic changes noted. There is no acute intracranial hemorrhage. No mass effect or midline shift noted. No extra-axial fluid collection. Vascular: No hyperdense vessel or unexpected calcification. Skull: Normal. Negative for fracture or focal lesion. Sinuses/Orbits: No acute finding. Other: Multiple areas of calcification over the scalp as seen on the prior CT. CT CERVICAL SPINE FINDINGS Alignment: No acute subluxation. Skull base and vertebrae: No acute fracture. No primary bone lesion or focal pathologic process. Soft tissues and spinal canal: No prevertebral fluid or swelling. No visible  canal hematoma. Disc levels: Degenerative changes with facet hypertrophy most prominent at C2-C3 on the right and C4-C5. Upper chest: Biapical subpleural scarring. Other: None IMPRESSION: 1. No acute intracranial pathology. 2. No acute/traumatic cervical spine pathology. Electronically Signed   By: Anner Crete M.D.   On: 11/14/2017 22:53   Ct Cervical Spine Wo Contrast  Result Date: 11/14/2017 CLINICAL DATA:  84 year old female with fall and C-spine trauma. EXAM: CT HEAD WITHOUT CONTRAST CT CERVICAL SPINE WITHOUT CONTRAST TECHNIQUE: Multidetector CT imaging of the head and cervical spine was performed following the standard protocol without intravenous  contrast. Multiplanar CT image reconstructions of the cervical spine were also generated. COMPARISON:  Head CT dated 01/29/2017 FINDINGS: CT HEAD FINDINGS Brain: The ventricles and sulci appropriate size for patient's age. Minimal periventricular and deep white matter chronic microvascular ischemic changes noted. There is no acute intracranial hemorrhage. No mass effect or midline shift noted. No extra-axial fluid collection. Vascular: No hyperdense vessel or unexpected calcification. Skull: Normal. Negative for fracture or focal lesion. Sinuses/Orbits: No acute finding. Other: Multiple areas of calcification over the scalp as seen on the prior CT. CT CERVICAL SPINE FINDINGS Alignment: No acute subluxation. Skull base and vertebrae: No acute fracture. No primary bone lesion or focal pathologic process. Soft tissues and spinal canal: No prevertebral fluid or swelling. No visible canal hematoma. Disc levels: Degenerative changes with facet hypertrophy most prominent at C2-C3 on the right and C4-C5. Upper chest: Biapical subpleural scarring. Other: None IMPRESSION: 1. No acute intracranial pathology. 2. No acute/traumatic cervical spine pathology. Electronically Signed   By: Anner Crete M.D.   On: 11/14/2017 22:53   Mr Brain Wo Contrast  Result Date: 11/15/2017 CLINICAL DATA:  Dizziness for the last several weeks. Fell last night with trauma to the head. EXAM: MRI HEAD WITHOUT CONTRAST TECHNIQUE: Multiplanar, multiecho pulse sequences of the brain and surrounding structures were obtained without intravenous contrast. COMPARISON:  CT studies done yesterday. FINDINGS: Brain: Diffusion imaging does not show any acute or subacute infarction. The brainstem and cerebellum are normal. Cerebral hemispheres show age related atrophy with mild moderate small vessel change of the white matter. No cortical or large vessel territory infarction. No mass lesion, hemorrhage, hydrocephalus or extra-axial collection. Vascular:  Major vessels at the base of the brain show flow. Skull and upper cervical spine: Negative Sinuses/Orbits: Clear/normal. No fluid in the mastoids or middle ears. Other: Presumed sebaceous cysts of the scalp. IMPRESSION: No acute or reversible finding. Age related volume loss. Mild to moderate chronic small-vessel ischemic changes of the cerebral hemispheric white matter. Electronically Signed   By: Nelson Chimes M.D.   On: 11/15/2017 09:03   Dg Hip Unilat W Or Wo Pelvis 2-3 Views Left  Result Date: 11/15/2017 CLINICAL DATA:  84 year old female with fall and left hip pain. EXAM: DG HIP (WITH OR WITHOUT PELVIS) 2-3V LEFT COMPARISON:  None. FINDINGS: No definite acute fracture or dislocation. Mild arthritic changes of the hips. The bones are mildly osteopenic. The soft tissues are grossly unremarkable. There is moderate colonic stool burden. IMPRESSION: No acute fracture or dislocation. Electronically Signed   By: Anner Crete M.D.   On: 11/15/2017 00:01    Pending Labs Unresulted Labs (From admission, onward)   Start     Ordered   11/22/17 0500  Creatinine, serum  (enoxaparin (LOVENOX)    CrCl >/= 30 ml/min)  Weekly,   R    Comments:  while on enoxaparin therapy    11/15/17 0318  11/15/17 0242  Urine culture  STAT,   STAT     11/15/17 0241      Vitals/Pain Today's Vitals   11/15/17 0800 11/15/17 1100 11/15/17 1109 11/15/17 1130  BP: (!) 149/80 (!) 180/101 (!) 180/101 (!) 170/80  Pulse: 60 82  88  Resp: 15 16  20   SpO2: 97% 98%  99%  PainSc:        Isolation Precautions No active isolations  Medications Medications  aspirin chewable tablet 81 mg (81 mg Oral Given 11/15/17 1114)  donepezil (ARICEPT) tablet 5 mg (has no administration in time range)  latanoprost (XALATAN) 0.005 % ophthalmic solution 1 drop (has no administration in time range)  polyethylene glycol (MIRALAX / GLYCOLAX) packet 17 g (has no administration in time range)  pravastatin (PRAVACHOL) tablet 40 mg (40 mg  Oral Given 11/15/17 1114)  acetaminophen (TYLENOL) tablet 650 mg (650 mg Oral Given 11/15/17 1113)    Or  acetaminophen (TYLENOL) suppository 650 mg ( Rectal See Alternative 11/15/17 1113)  ondansetron (ZOFRAN) tablet 4 mg (has no administration in time range)    Or  ondansetron (ZOFRAN) injection 4 mg (has no administration in time range)  enoxaparin (LOVENOX) injection 40 mg (has no administration in time range)  0.9 %  sodium chloride infusion ( Intravenous New Bag/Given 11/15/17 0435)  cefTRIAXone (ROCEPHIN) 1 g in sodium chloride 0.9 % 100 mL IVPB (0 g Intravenous Stopped 11/15/17 0819)  hydrALAZINE (APRESOLINE) injection 5 mg (5 mg Intravenous Given 11/15/17 1109)  ondansetron (ZOFRAN-ODT) disintegrating tablet 8 mg (8 mg Oral Not Given 11/14/17 2357)  acetaminophen (TYLENOL) tablet 650 mg (650 mg Oral Given 11/14/17 2337)  sodium chloride 0.9 % bolus 500 mL (0 mLs Intravenous Stopped 11/15/17 0047)  meclizine (ANTIVERT) tablet 12.5 mg (12.5 mg Oral Given 11/14/17 2337)    Mobility walks with person assist

## 2017-11-15 NOTE — Progress Notes (Signed)
Received patient form ED, VS obtained, telemetry monitor applied, oriented to unit, call light placed in reach

## 2017-11-16 DIAGNOSIS — R269 Unspecified abnormalities of gait and mobility: Secondary | ICD-10-CM | POA: Diagnosis present

## 2017-11-16 DIAGNOSIS — F09 Unspecified mental disorder due to known physiological condition: Secondary | ICD-10-CM | POA: Diagnosis present

## 2017-11-16 DIAGNOSIS — F0150 Vascular dementia without behavioral disturbance: Secondary | ICD-10-CM

## 2017-11-16 DIAGNOSIS — Z7982 Long term (current) use of aspirin: Secondary | ICD-10-CM | POA: Diagnosis not present

## 2017-11-16 DIAGNOSIS — I252 Old myocardial infarction: Secondary | ICD-10-CM | POA: Diagnosis not present

## 2017-11-16 DIAGNOSIS — F0390 Unspecified dementia without behavioral disturbance: Secondary | ICD-10-CM | POA: Diagnosis present

## 2017-11-16 DIAGNOSIS — R42 Dizziness and giddiness: Secondary | ICD-10-CM

## 2017-11-16 DIAGNOSIS — E860 Dehydration: Secondary | ICD-10-CM | POA: Diagnosis present

## 2017-11-16 DIAGNOSIS — I10 Essential (primary) hypertension: Secondary | ICD-10-CM | POA: Diagnosis present

## 2017-11-16 DIAGNOSIS — Z66 Do not resuscitate: Secondary | ICD-10-CM | POA: Diagnosis present

## 2017-11-16 DIAGNOSIS — H409 Unspecified glaucoma: Secondary | ICD-10-CM | POA: Diagnosis present

## 2017-11-16 DIAGNOSIS — N390 Urinary tract infection, site not specified: Principal | ICD-10-CM

## 2017-11-16 DIAGNOSIS — I2510 Atherosclerotic heart disease of native coronary artery without angina pectoris: Secondary | ICD-10-CM | POA: Diagnosis present

## 2017-11-16 DIAGNOSIS — I712 Thoracic aortic aneurysm, without rupture: Secondary | ICD-10-CM

## 2017-11-16 DIAGNOSIS — Z79899 Other long term (current) drug therapy: Secondary | ICD-10-CM | POA: Diagnosis not present

## 2017-11-16 DIAGNOSIS — D649 Anemia, unspecified: Secondary | ICD-10-CM | POA: Diagnosis present

## 2017-11-16 DIAGNOSIS — N179 Acute kidney failure, unspecified: Secondary | ICD-10-CM | POA: Diagnosis present

## 2017-11-16 DIAGNOSIS — J479 Bronchiectasis, uncomplicated: Secondary | ICD-10-CM | POA: Diagnosis present

## 2017-11-16 MED ORDER — TRAZODONE HCL 50 MG PO TABS
50.0000 mg | ORAL_TABLET | Freq: Once | ORAL | Status: AC
  Administered 2017-11-16: 50 mg via ORAL
  Filled 2017-11-16: qty 1

## 2017-11-16 MED ORDER — SODIUM CHLORIDE 0.9 % IV SOLN
INTRAVENOUS | Status: DC
Start: 2017-11-16 — End: 2017-11-17
  Administered 2017-11-16 – 2017-11-17 (×2): via INTRAVENOUS

## 2017-11-16 MED ORDER — AMLODIPINE BESYLATE 5 MG PO TABS
5.0000 mg | ORAL_TABLET | Freq: Every day | ORAL | Status: DC
  Administered 2017-11-17: 5 mg via ORAL
  Filled 2017-11-16: qty 1

## 2017-11-16 NOTE — Progress Notes (Signed)
Assumed care for this pt at 2200. I agree with the assessment performed by the previous nurse.

## 2017-11-16 NOTE — Progress Notes (Addendum)
PROGRESS NOTE  Kaitlyn Ortiz XAJ:[id number] DOB: [birth year] DOA: 11/14/2017 PCP: Gayland Curry, DO  HPI/Recap of past 24 hours: Kaitlyn Ortiz a 84 y.o.femalewithhistory of CAD, hypertension, cognitive changes, bronchiectasis who has been having persistent dizziness mostly on standing over the last for many months and patient primary care physician has been changing her antihypertensive and recently placed on ARB which patient is here today was brought to the ER after patient had a fall.  Patient states he was feeling dizzy hold onto a drawer and fell backwards. Denies losing consciousness. Denies any chest pain or palpitations. Patient's dizziness is mostly on standing.    CT head and C-spine chest x-ray and x-ray pelvis all of which were negative. EKG was showing normal sinus rhythm. UA shows features concerning for UTI.  Admitted for dizziness and fall in the setting of UTI.  11/16/2017: Patient seen and examined at her bedside she is alert and oriented x3.  She reports suprapubic abdominal pain on palpation.  Denies nausea.     Assessment/Plan: Principal Problem:   Dizziness Active Problems:   Essential hypertension   Coronary artery disease with history of myocardial infarction without history of CABG   Thoracic aortic aneurysm without rupture (HCC)   Dementia without behavioral disturbance   Normocytic normochromic anemia  Dizziness and falls suspect secondary to UTI versus others MRI brain unremarkable for any acute intracranial findings Positive UA Obtain orthostatic vital signs PT OT to evaluate Uses a walker at baseline Continue gentle IV fluid hydration Hold Imdur due to dizziness  Urinary tract infection Continue IV ceftriaxone Urine culture grew greater than 100,000 colonies of Staphylococcus Lugdunens Awaiting sensitivitiesis Monitor urine output Monitor fever curve and WBC Repeat CBC in the morning  AKI, improving Baseline creatinine  0.8 Creatinine 0.9 from 1.11 on admission with GFR 57 Avoid nephrotoxic agent/hypotension/dehydration Hold irbesartan due to recent AKI Continue gentle IV fluid hydration Obtain BMP in the morning  Ambulatory dysfunction Uses a walker at baseline PT to assess for any needs, recommends HHPT Fall precautions    Code Status: DNR  Family Communication: None at bedside  Disposition Plan: ALF in 1 to 2 days once urine culture sensitivities return for discharge oral antibiotics.   Consultants:  None  Procedures:  None  Antimicrobials:  IV ceftriaxone  DVT prophylaxis: Subcu Lovenox   Objective: Vitals:   11/15/17 1410 11/15/17 1416 11/15/17 2131 11/16/17 0607  BP: (!) 186/93  (!) 149/90 (!) 159/94  Pulse: 86  81 82  Resp:   18 18  Temp: 98 F (36.7 C)  98.6 F (37 C) 98.9 F (37.2 C)  TempSrc: Oral  Oral Oral  SpO2: 100%  96% 97%  Weight:  65.3 kg (143 lb 15.4 oz)    Height:  5\' 8"  (1.727 m)      Intake/Output Summary (Last 24 hours) at 11/16/2017 1234 Last data filed at 11/16/2017 0636 Gross per 24 hour  Intake 1530.42 ml  Output 500 ml  Net 1030.42 ml   Filed Weights   11/15/17 1416  Weight: 65.3 kg (143 lb 15.4 oz)    Exam:  . General: 84 y.o. year-old female well developed well nourished in no acute distress.  Alert and oriented x3.  Tremors involving upper extremities. . Cardiovascular: Regular rate and rhythm with no rubs or gallops.  No thyromegaly or JVD noted.   Marland Kitchen Respiratory: Clear to auscultation with no wheezes or rales. Good inspiratory effort. . Abdomen: Soft nontender nondistended with normal bowel sounds  x4 quadrants. . Psychiatry: Mood is appropriate for condition and setting   Data Reviewed: CBC: Recent Labs  Lab 11/14/17 2357 11/15/17 0448  WBC 8.3 6.3  NEUTROABS 6.8  --   HGB 11.9* 11.7*  HCT 35.3* 35.4*  MCV 91.5 91.0  PLT 194 474   Basic Metabolic Panel: Recent Labs  Lab 11/14/17 2357 11/15/17 0448  NA 140 140   K 4.2 3.9  CL 106 106  CO2 25 26  GLUCOSE 124* 109*  BUN 24* 22  CREATININE 1.11* 0.90  CALCIUM 9.3 8.9   GFR: Estimated Creatinine Clearance: 46.9 mL/min (by C-G formula based on SCr of 0.9 mg/dL). Liver Function Tests: Recent Labs  Lab 11/14/17 2357  AST 32  ALT 24  ALKPHOS 87  BILITOT 0.9  PROT 7.0  ALBUMIN 4.0   No results for input(s): LIPASE, AMYLASE in the last 168 hours. No results for input(s): AMMONIA in the last 168 hours. Coagulation Profile: No results for input(s): INR, PROTIME in the last 168 hours. Cardiac Enzymes: No results for input(s): CKTOTAL, CKMB, CKMBINDEX, TROPONINI in the last 168 hours. BNP (last 3 results) No results for input(s): PROBNP in the last 8760 hours. HbA1C: No results for input(s): HGBA1C in the last 72 hours. CBG: No results for input(s): GLUCAP in the last 168 hours. Lipid Profile: No results for input(s): CHOL, HDL, LDLCALC, TRIG, CHOLHDL, LDLDIRECT in the last 72 hours. Thyroid Function Tests: No results for input(s): TSH, T4TOTAL, FREET4, T3FREE, THYROIDAB in the last 72 hours. Anemia Panel: No results for input(s): VITAMINB12, FOLATE, FERRITIN, TIBC, IRON, RETICCTPCT in the last 72 hours. Urine analysis:    Component Value Date/Time   COLORURINE YELLOW 11/15/2017 0211   APPEARANCEUR CLOUDY (A) 11/15/2017 0211   LABSPEC 1.017 11/15/2017 0211   PHURINE 5.0 11/15/2017 0211   GLUCOSEU NEGATIVE 11/15/2017 0211   HGBUR NEGATIVE 11/15/2017 0211   BILIRUBINUR NEGATIVE 11/15/2017 0211   KETONESUR NEGATIVE 11/15/2017 0211   PROTEINUR NEGATIVE 11/15/2017 0211   NITRITE NEGATIVE 11/15/2017 0211   LEUKOCYTESUR LARGE (A) 11/15/2017 0211   Sepsis Labs: @LABRCNTIP (procalcitonin:4,lacticidven:4)  ) Recent Results (from the past 240 hour(s))  Urine culture     Status: Abnormal (Preliminary result)   Collection Time: 11/15/17  2:11 AM  Result Value Ref Range Status   Specimen Description   Final    URINE, CLEAN  CATCH Performed at [facility], [place] [address] [place], [place] [address]    Special Requests   Final    NONE Performed at [facility], [place] [address] [place], [place] [address]    Culture (A)  Final    >=100,000 COLONIES/mL STAPHYLOCOCCUS LUGDUNENSIS SUSCEPTIBILITIES TO FOLLOW Performed at [facility], [place] [address] [place], [place] [address]    Report Status PENDING  Incomplete  MRSA PCR Screening     Status: None   Collection Time: 11/15/17  2:22 PM  Result Value Ref Range Status   MRSA by PCR NEGATIVE NEGATIVE Final    Comment:        The GeneXpert MRSA Assay (FDA approved for NASAL specimens only), is one component of a comprehensive MRSA colonization surveillance program. It is not intended to diagnose MRSA infection nor to guide or monitor treatment for MRSA infections. Performed at [facility], [place] [address] [place], [place] [address]       Studies: No results found.  Scheduled Meds: . aspirin  81 mg Oral Daily  . donepezil  5 mg Oral QHS  . enoxaparin (LOVENOX) injection  40 mg Subcutaneous Q24H  . hydrALAZINE  25 mg Oral Q8H  . latanoprost  1 drop Both Eyes QHS  . pravastatin  40 mg Oral Daily    Continuous Infusions: . cefTRIAXone (ROCEPHIN)  IV Stopped (11/16/17 8295)     LOS: 0 days     Kayleen Memos, MD [facility] Pager [phone]  If 7PM-7AM, please contact night-coverage [email or web address] Password [email or web address] 11/16/2017, 12:34 PM

## 2017-11-17 DIAGNOSIS — I10 Essential (primary) hypertension: Secondary | ICD-10-CM

## 2017-11-17 LAB — BASIC METABOLIC PANEL
Anion gap: 8 (ref 5–15)
BUN: 19 mg/dL (ref 8–23)
CHLORIDE: 105 mmol/L (ref 98–111)
CO2: 26 mmol/L (ref 22–32)
Calcium: 8.9 mg/dL (ref 8.9–10.3)
Creatinine, Ser: 0.87 mg/dL (ref 0.44–1.00)
GFR calc Af Amer: >60 mL/min (ref >60)
GFR calc non Af Amer: 59 mL/min — ABNORMAL LOW (ref >60)
GLUCOSE: 110 mg/dL — AB (ref 70–99)
POTASSIUM: 3.8 mmol/L (ref 3.5–5.1)
Sodium: 139 mmol/L (ref 135–145)

## 2017-11-17 LAB — CBC
HCT: 38.1 % (ref 36.0–46.0)
HEMOGLOBIN: 12.8 g/dL (ref 12.0–15.0)
MCH: 31 pg (ref 26.0–34.0)
MCHC: 33.6 g/dL (ref 30.0–36.0)
MCV: 92.3 fL (ref 78.0–100.0)
Platelets: 199 10*3/uL (ref 150–400)
RBC: 4.13 MIL/uL (ref 3.87–5.11)
RDW: 15.5 % (ref 11.5–15.5)
WBC: 6.1 10*3/uL (ref 4.0–10.5)

## 2017-11-17 LAB — URINE CULTURE

## 2017-11-17 LAB — MAGNESIUM: Magnesium: 2 mg/dL (ref 1.7–2.4)

## 2017-11-17 MED ORDER — AMLODIPINE BESYLATE 5 MG PO TABS: 5.0000 mg | ORAL_TABLET | Freq: Every day | ORAL | 0 refills | Status: DC

## 2017-11-17 MED ORDER — SULFAMETHOXAZOLE-TRIMETHOPRIM 800-160 MG PO TABS: 1.0000 | ORAL_TABLET | Freq: Two times a day (BID) | ORAL | 0 refills | Status: AC

## 2017-11-17 MED ORDER — SULFAMETHOXAZOLE-TRIMETHOPRIM 800-160 MG PO TABS
1.0000 | ORAL_TABLET | Freq: Two times a day (BID) | ORAL | Status: DC
  Administered 2017-11-17: 1 via ORAL
  Filled 2017-11-17: qty 1

## 2017-11-17 NOTE — Discharge Instructions (Signed)

## 2017-11-17 NOTE — Progress Notes (Signed)
Patient will discharge back to [facility] Anticipated discharge date: 11/17/17 Family notified: Hilarie Fredrickson, daughter Daughter will drive patient back to ALF.  Nurse to call report to [phone].   CSW signing off.  Estanislado Emms, [place]  Clinical Social Worker

## 2017-11-17 NOTE — Care Management Note (Signed)
Case Management Note  Patient Details  Name: Etna Forquer MRN: [id number] Date of Birth: September 29, 1931  Subjective/Objective:    Dizziness, HTN,                 Action/Plan: NCM spoke to pt and grand-dtr. Pt lives at [facility] and will need PT. NCM notified CSW for follow up on ALF.   Expected Discharge Date:  11/17/17               Expected Discharge Plan:  Assisted Living / Rest Home  In-House Referral:  Clinical Social Work  Discharge planning Services  CM Consult  Post Acute Care Choice:  Home Health Choice offered to:  Adult Children  DME Arranged:  N/A DME Agency:  NA  HH Arranged:  PT HH Agency:  Other - See comment  Status of Service:  Completed, signed off  If discussed at [place] of Stay Meetings, dates discussed:    Additional Comments:  Erenest Rasher, RN 11/17/2017, 12:12 PM

## 2017-11-17 NOTE — Clinical Social Work Note (Signed)
Clinical Social Work Assessment  Patient Details  Name: Kaitlyn Ortiz MRN: [id number] Date of Birth: 29-Oct-1931  Date of referral:  11/17/17               Reason for consult:  Facility Placement(from [facility])                Permission sought to share information with:  Facility Sport and exercise psychologist, Family Supports Permission granted to share information::     Name::     Hilarie Fredrickson  Agency::  [facility]  Relationship::  daughter  Contact Information:  [phone]  Housing/Transportation Living arrangements for the past 2 months:  [place] of Information:  Adult Children, Facility Patient Interpreter Needed:  None Criminal Activity/Legal Involvement Pertinent to Current Situation/Hospitalization:  No - Comment as needed Significant Relationships:  Adult Children Lives with:  Facility Resident Do you feel safe going back to the place where you live?  Yes Need for family participation in patient care:  Yes (Comment)  Care giving concerns:  Patient from [facility]. Plan to return today.   Social Worker assessment / plan: CSW spoke to patient's daughter, [name], on the phone. Patient is discharged and planning to return to ALF today. [name] will drive patient back to [facility]. CSW sent patient's discharge summary and FL2 to [facility]. Facility ready for patient to return today. CSW signing off.  Employment status:  Retired Forensic scientist:  Medicare PT Recommendations:  Home with [place] / Referral to community resources:  Other (Comment Required)(back to ALF)  Patient/Family's Response to care: Daughter appreciative of care.  Patient/Family's Understanding of and Emotional Response to Diagnosis, Current Treatment, and Prognosis: Daughter with understanding of condition and agreeable for patient to return to ALF today.  Emotional Assessment Appearance:  Appears stated age Attitude/Demeanor/Rapport:   Unable to Assess Affect (typically observed):  Unable to Assess Orientation:  Oriented to Self, Oriented to Place, Oriented to  Time Alcohol / Substance use:  Not Applicable Psych involvement (Current and /or in the community):  No (Comment)  Discharge Needs  Concerns to be addressed:  Discharge Planning Concerns, Care Coordination Readmission within the last 30 days:  No Current discharge risk:  Physical Impairment Barriers to Discharge:  No Barriers Identified   Estanislado Emms, LCSW 11/17/2017, 2:44 PM

## 2017-11-17 NOTE — Discharge Summary (Signed)
Discharge Summary  Kaitlyn Ortiz EXH:[id number] DOB: Oct 05, 1931  PCP: Gayland Curry, DO  Admit date: 11/14/2017 Discharge date: 11/17/2017  Time spent: 25 minutes  Recommendations for Outpatient Follow-up:  1. Follow-up with your PCP 2. Take your medications as prescribed  Discharge Diagnoses:  Active Hospital Problems   Diagnosis Date Noted  . Dizziness 11/15/2017  . Normocytic normochromic anemia 11/15/2017  . Dementia without behavioral disturbance 04/10/2017  . Essential hypertension 12/26/2016  . Coronary artery disease with history of myocardial infarction without history of CABG 09/18/2016  . Thoracic aortic aneurysm without rupture ([place]) 08/28/2016    Resolved Hospital Problems  No resolved problems to display.    Discharge Condition: Stable  Diet recommendation: Resume previous diet  Vitals:   11/16/17 2008 11/17/17 0413  BP: 134/78 139/88  Pulse: 73 79  Resp: 18 18  Temp: 98.6 F (37 C) 98 F (36.7 C)  SpO2: 100%     History of present illness:   Kaitlyn Ortiz a 84 y.o.femalewithhistory of CAD, hypertension, cognitive changes, bronchiectasis who has been having persistent dizziness mostly on standing over the last months and patient primary care physician has been changing her antihypertensive and recently placed on ARB which patient is here today was brought to the ER after patient had a fall.  Patient states he was feeling dizzy hold onto a drawer and fell backwards. Denies losing consciousness. Denies any chest pain or palpitations. Patient's dizziness is mostly on standing.    CT head and C-spine chest x-ray and x-ray pelvis, MRI brain, all of which were negative. EKG was showing normal sinus rhythm. UA shows features concerning for UTI.  Admitted for dizziness and fall in the setting of UTI.  Responded well to treatment.  11/17/2017: Patient seen and examined at her bedside.  She has no new complaints.  She is eating well.  PT  assessed and recommended home health PT.    On the day of discharge, the patient was hemodynamically stable.  She will need to follow-up with her primary care provider post hospitalization and continue physical therapy at assisted living facility.     Hospital Course:  Principal Problem:   Dizziness Active Problems:   Essential hypertension   Coronary artery disease with history of myocardial infarction without history of CABG   Thoracic aortic aneurysm without rupture (HCC)   Dementia without behavioral disturbance   Normocytic normochromic anemia  Dizziness and falls suspect secondary to UTI versus others MRI brain, CT head, chest x-ray unremarkable for any acute findings Positive UA Positive orthostatic vital signs Continue to hold Imdur due to dizziness Continue to hold irbesartan due to dehydration and recent AKI Follow-up with PCP-call tomorrow 11/18/2017 to make an appointment PT recommends home health PT Uses a walker at baseline Avoid dehydration  Urinary tract infection Completed IV ceftriaxone Start Bactrim twice daily x7 days Urine culture grew greater than 100,000 colonies of Staphylococcus Lugdunens Sensitive to Bactrim  AKI-suspect prerenal secondary to dehydration, resolved Baseline creatinine 0.8 Creatinine on 11/17/2017 is 0.8 Avoid nephrotoxic agent/hypotension/dehydration Hold irbesartan due to recent AKI and dehydration  Hypertension Blood pressure is stable and well controlled Started amlodipine 5 mg daily during his hospitalization since we are holding Imdur and lrbesartan  Ambulatory dysfunction Uses a walker at baseline PT recommends HHPT Fall precautions   Procedures:  None  Consultations:  None  Discharge Exam: BP 139/88 (BP Location: Right Arm)   Pulse 79   Temp 98 F (36.7 C)   Resp 18  Ht 5\' 8"  (1.727 m)   Wt 68 kg (149 lb 14.6 oz)   SpO2 100%   BMI 22.79 kg/m  . General: 84 y.o. year-old female well developed  well nourished in no acute distress.  Alert and oriented x3. . Cardiovascular: Regular rate and rhythm with no rubs or gallops.  No thyromegaly or JVD noted.   Marland Kitchen Respiratory: Clear to auscultation with no wheezes or rales. Good inspiratory effort. . Abdomen: Soft nontender nondistended with normal bowel sounds x4 quadrants. . Musculoskeletal: No lower extremity edema. 2/4 pulses in all 4 extremities. . Skin: No ulcerative lesions noted or rashes, . Psychiatry: Mood is appropriate for condition and setting  Discharge Instructions You were cared for by a hospitalist during your hospital stay. If you have any questions about your discharge medications or the care you received while you were in the hospital after you are discharged, you can call the unit and asked to speak with the hospitalist on call if the hospitalist that took care of you is not available. Once you are discharged, your primary care physician will handle any further medical issues. Please note that NO REFILLS for any discharge medications will be authorized once you are discharged, as it is imperative that you return to your primary care physician (or establish a relationship with a primary care physician if you do not have one) for your aftercare needs so that they can reassess your need for medications and monitor your lab values.   Allergies as of 11/17/2017      Reactions   Caffeine       Medication List    STOP taking these medications   irbesartan 75 MG tablet Commonly known as:  AVAPRO   isosorbide mononitrate 60 MG 24 hr tablet Commonly known as:  IMDUR   trimethoprim 100 MG tablet Commonly known as:  TRIMPEX     TAKE these medications   amLODipine 5 MG tablet Commonly known as:  NORVASC Take 1 tablet (5 mg total) by mouth daily. Start taking on:  11/18/2017   aspirin 81 MG chewable tablet Chew 1 tablet by mouth daily.   donepezil 5 MG tablet Commonly known as:  ARICEPT Take 1 tablet (5 mg total) by mouth  at bedtime.   GLUCOSAMINE 1500 COMPLEX PO Take 1-2 tablets by mouth daily.   latanoprost 0.005 % ophthalmic solution Commonly known as:  XALATAN Place 1 drop into both eyes at bedtime.   Lutein 6 MG Caps Take 1 capsule by mouth daily.   multivitamin tablet Take 1 tablet by mouth daily.   polyethylene glycol packet Commonly known as:  MIRALAX / GLYCOLAX Take 17 g by mouth daily as needed.   pravastatin 40 MG tablet Commonly known as:  PRAVACHOL TAKE 1 TABLET ONCE DAILY.   sulfamethoxazole-trimethoprim 800-160 MG tablet Commonly known as:  BACTRIM DS,SEPTRA DS Take 1 tablet by mouth 2 (two) times daily for 7 days.   vitamin C 1000 MG tablet Take 1,000 mg by mouth daily.   Vitamin D3 5000 units Caps Take 1 capsule (5,000 Units total) by mouth daily.      Allergies  Allergen Reactions  . Caffeine    Follow-up Information    Reed, Tiffany L, DO. Call in 1 day(s).   Specialty:  Geriatric Medicine Why:  Please call on Monday 11/18/17 to schedule an appointment. Contact information: [place]. [place] [address] [phone]            The results of  significant diagnostics from this hospitalization (including imaging, microbiology, ancillary and laboratory) are listed below for reference.    Significant Diagnostic Studies: Dg Chest 2 View  Result Date: 11/14/2017 CLINICAL DATA:  84 year old female with fall and left hip pain. EXAM: CHEST - 2 VIEW COMPARISON:  Chest CT dated 07/27/2017 FINDINGS: Mild bronchiectatic changes. There is no focal consolidation, pleural effusion, pneumothorax. The cardiac silhouette is within normal limits. No acute osseous pathology. IMPRESSION: No active cardiopulmonary disease. Electronically Signed   By: Anner Crete M.D.   On: 11/14/2017 23:59   Ct Head Wo Contrast  Result Date: 11/14/2017 CLINICAL DATA:  84 year old female with fall and C-spine trauma. EXAM: CT HEAD WITHOUT CONTRAST CT CERVICAL SPINE WITHOUT CONTRAST  TECHNIQUE: Multidetector CT imaging of the head and cervical spine was performed following the standard protocol without intravenous contrast. Multiplanar CT image reconstructions of the cervical spine were also generated. COMPARISON:  Head CT dated 01/29/2017 FINDINGS: CT HEAD FINDINGS Brain: The ventricles and sulci appropriate size for patient's age. Minimal periventricular and deep white matter chronic microvascular ischemic changes noted. There is no acute intracranial hemorrhage. No mass effect or midline shift noted. No extra-axial fluid collection. Vascular: No hyperdense vessel or unexpected calcification. Skull: Normal. Negative for fracture or focal lesion. Sinuses/Orbits: No acute finding. Other: Multiple areas of calcification over the scalp as seen on the prior CT. CT CERVICAL SPINE FINDINGS Alignment: No acute subluxation. Skull base and vertebrae: No acute fracture. No primary bone lesion or focal pathologic process. Soft tissues and spinal canal: No prevertebral fluid or swelling. No visible canal hematoma. Disc levels: Degenerative changes with facet hypertrophy most prominent at C2-C3 on the right and C4-C5. Upper chest: Biapical subpleural scarring. Other: None IMPRESSION: 1. No acute intracranial pathology. 2. No acute/traumatic cervical spine pathology. Electronically Signed   By: Anner Crete M.D.   On: 11/14/2017 22:53   Ct Cervical Spine Wo Contrast  Result Date: 11/14/2017 CLINICAL DATA:  84 year old female with fall and C-spine trauma. EXAM: CT HEAD WITHOUT CONTRAST CT CERVICAL SPINE WITHOUT CONTRAST TECHNIQUE: Multidetector CT imaging of the head and cervical spine was performed following the standard protocol without intravenous contrast. Multiplanar CT image reconstructions of the cervical spine were also generated. COMPARISON:  Head CT dated 01/29/2017 FINDINGS: CT HEAD FINDINGS Brain: The ventricles and sulci appropriate size for patient's age. Minimal periventricular and deep  white matter chronic microvascular ischemic changes noted. There is no acute intracranial hemorrhage. No mass effect or midline shift noted. No extra-axial fluid collection. Vascular: No hyperdense vessel or unexpected calcification. Skull: Normal. Negative for fracture or focal lesion. Sinuses/Orbits: No acute finding. Other: Multiple areas of calcification over the scalp as seen on the prior CT. CT CERVICAL SPINE FINDINGS Alignment: No acute subluxation. Skull base and vertebrae: No acute fracture. No primary bone lesion or focal pathologic process. Soft tissues and spinal canal: No prevertebral fluid or swelling. No visible canal hematoma. Disc levels: Degenerative changes with facet hypertrophy most prominent at C2-C3 on the right and C4-C5. Upper chest: Biapical subpleural scarring. Other: None IMPRESSION: 1. No acute intracranial pathology. 2. No acute/traumatic cervical spine pathology. Electronically Signed   By: Anner Crete M.D.   On: 11/14/2017 22:53   Mr Brain Wo Contrast  Result Date: 11/15/2017 CLINICAL DATA:  Dizziness for the last several weeks. Fell last night with trauma to the head. EXAM: MRI HEAD WITHOUT CONTRAST TECHNIQUE: Multiplanar, multiecho pulse sequences of the brain and surrounding structures were obtained without intravenous contrast. COMPARISON:  CT studies done yesterday. FINDINGS: Brain: Diffusion imaging does not show any acute or subacute infarction. The brainstem and cerebellum are normal. Cerebral hemispheres show age related atrophy with mild moderate small vessel change of the white matter. No cortical or large vessel territory infarction. No mass lesion, hemorrhage, hydrocephalus or extra-axial collection. Vascular: Major vessels at the base of the brain show flow. Skull and upper cervical spine: Negative Sinuses/Orbits: Clear/normal. No fluid in the mastoids or middle ears. Other: Presumed sebaceous cysts of the scalp. IMPRESSION: No acute or reversible finding. Age  related volume loss. Mild to moderate chronic small-vessel ischemic changes of the cerebral hemispheric white matter. Electronically Signed   By: Nelson Chimes M.D.   On: 11/15/2017 09:03   Dg Hip Unilat W Or Wo Pelvis 2-3 Views Left  Result Date: 11/15/2017 CLINICAL DATA:  84 year old female with fall and left hip pain. EXAM: DG HIP (WITH OR WITHOUT PELVIS) 2-3V LEFT COMPARISON:  None. FINDINGS: No definite acute fracture or dislocation. Mild arthritic changes of the hips. The bones are mildly osteopenic. The soft tissues are grossly unremarkable. There is moderate colonic stool burden. IMPRESSION: No acute fracture or dislocation. Electronically Signed   By: Anner Crete M.D.   On: 11/15/2017 00:01    Microbiology: Recent Results (from the past 240 hour(s))  Urine culture     Status: Abnormal   Collection Time: 11/15/17  2:11 AM  Result Value Ref Range Status   Specimen Description   Final    URINE, CLEAN CATCH Performed at [facility], [place] [address] [place], [place] [address]    Special Requests   Final    NONE Performed at [facility], [place] [address] [place], [place] [address]    Culture >=100,000 COLONIES/mL STAPHYLOCOCCUS LUGDUNENSIS (A)  Final   Report Status 11/17/2017 FINAL  Final   Organism ID, Bacteria STAPHYLOCOCCUS LUGDUNENSIS (A)  Final      Susceptibility   Staphylococcus lugdunensis - MIC*    CIPROFLOXACIN <=0.5 SENSITIVE Sensitive     GENTAMICIN <=0.5 SENSITIVE Sensitive     NITROFURANTOIN <=16 SENSITIVE Sensitive     OXACILLIN 0.5 SENSITIVE Sensitive     TETRACYCLINE <=1 SENSITIVE Sensitive     VANCOMYCIN <=0.5 SENSITIVE Sensitive     TRIMETH/SULFA <=10 SENSITIVE Sensitive     CLINDAMYCIN RESISTANT Resistant     RIFAMPIN <=0.5 SENSITIVE Sensitive     Inducible Clindamycin POSITIVE Resistant     * >=100,000 COLONIES/mL STAPHYLOCOCCUS LUGDUNENSIS  MRSA PCR Screening     Status: None   Collection Time: 11/15/17  2:22 PM   Result Value Ref Range Status   MRSA by PCR NEGATIVE NEGATIVE Final    Comment:        The GeneXpert MRSA Assay (FDA approved for NASAL specimens only), is one component of a comprehensive MRSA colonization surveillance program. It is not intended to diagnose MRSA infection nor to guide or monitor treatment for MRSA infections. Performed at [facility], [place] [address] [place], [place] [address]      Labs: Basic Metabolic Panel: Recent Labs  Lab 11/14/17 2357 11/15/17 0448 11/17/17 0401  NA 140 140 139  K 4.2 3.9 3.8  CL 106 106 105  CO2 25 26 26   GLUCOSE 124* 109* 110*  BUN 24* 22 19  CREATININE 1.11* 0.90 0.87  CALCIUM 9.3 8.9 8.9  MG  --   --  2.0   Liver Function Tests: Recent Labs  Lab 11/14/17 2357  AST 32  ALT 24  ALKPHOS 87  BILITOT 0.9  PROT 7.0  ALBUMIN 4.0   No results for input(s): LIPASE, AMYLASE in the last 168 hours. No results for input(s): AMMONIA in the last 168 hours. CBC: Recent Labs  Lab 11/14/17 2357 11/15/17 0448 11/17/17 0401  WBC 8.3 6.3 6.1  NEUTROABS 6.8  --   --   HGB 11.9* 11.7* 12.8  HCT 35.3* 35.4* 38.1  MCV 91.5 91.0 92.3  PLT 194 207 199   Cardiac Enzymes: No results for input(s): CKTOTAL, CKMB, CKMBINDEX, TROPONINI in the last 168 hours. BNP: BNP (last 3 results) No results for input(s): BNP in the last 8760 hours.  ProBNP (last 3 results) No results for input(s): PROBNP in the last 8760 hours.  CBG: No results for input(s): GLUCAP in the last 168 hours.     Signed:  Kayleen Memos, MD [facility] 11/17/2017, 11:56 AM

## 2017-11-17 NOTE — NC FL2 (Signed)
[place] LEVEL OF CARE SCREENING TOOL     IDENTIFICATION  Patient Name: Kaitlyn Ortiz Birthdate: 1931/07/15 Sex: female Admission Date (Current Location): 11/14/2017  [facility] and [place] Number:  Herbalist and Address:  [facility],  [place], [place]      Provider Number: [id number]  Attending Physician Name and Address:  Kayleen Memos, DO  Relative Name and Phone Number:  Hilarie Fredrickson, daughter    Current Level of Care: Hospital Recommended Level of Care: [place] Prior Approval Number:    Date Approved/Denied:   PASRR Number:    Discharge Plan: Domiciliary (Rest home)(ALF)    Current Diagnoses: Patient Active Problem List   Diagnosis Date Noted  . Dizziness 11/15/2017  . Normocytic normochromic anemia 11/15/2017  . Overactive bladder 11/04/2017  . Parkinsonism ([place]) 11/04/2017  . Recurrent falls 11/04/2017  . Left lateral knee pain 08/07/2017  . Dilated aortic root ([place]) 05/02/2017  . Angina pectoris ([place]) 05/02/2017  . Incontinence   . Benign hypertension   . Heart attack ([place])   . Cognitive changes   . Dementia without behavioral disturbance 04/10/2017  . Pain of toe 04/10/2017  . Essential hypertension 12/26/2016  . Bronchiectasis ([place]) 12/26/2016  . Glaucoma 12/26/2016  . Urinary incontinence 12/26/2016  . Chronic constipation 12/26/2016  . Pulmonary infiltrate 11/08/2016  . Coronary artery disease with history of myocardial infarction without history of CABG 09/18/2016  . Thoracic aortic aneurysm without rupture ([place]) 08/28/2016  . Hyperlipidemia LDL goal <100 08/16/2016    Orientation RESPIRATION BLADDER Height & Weight     Self, Time, Place  Normal Incontinent Weight: 149 lb 14.6 oz (68 kg) Height:  5\' 8"  (172.7 cm)  BEHAVIORAL SYMPTOMS/MOOD NEUROLOGICAL BOWEL NUTRITION STATUS      Continent Diet(please see DC summary)  AMBULATORY STATUS COMMUNICATION OF NEEDS  Skin   (baseline) Verbally Normal                       Personal Care Assistance Level of Assistance  Bathing, Feeding, Dressing(baseline)           Functional Limitations Info  Sight, Hearing, Speech Sight Info: Adequate Hearing Info: Adequate Speech Info: Adequate    SPECIAL CARE FACTORS FREQUENCY  PT (By licensed PT)     PT Frequency: 3x/week              Contractures Contractures Info: Not present    Additional Factors Info  Code Status, Allergies, Psychotropic Code Status Info: DNR Allergies Info: Caffeine Psychotropic Info: aricept         Current Medications (11/17/2017):  This is the current hospital active medication list Current Facility-Administered Medications  Medication Dose Route Frequency Provider Last Rate Last Dose  . acetaminophen (TYLENOL) tablet 650 mg  650 mg Oral Q6H PRN Rise Patience, MD   650 mg at 11/15/17 1810   Or  . acetaminophen (TYLENOL) suppository 650 mg  650 mg Rectal Q6H PRN Rise Patience, MD      . amLODipine (NORVASC) tablet 5 mg  5 mg Oral Daily [place], Carole N, DO   5 mg at 11/17/17 1005  . aspirin chewable tablet 81 mg  81 mg Oral Daily Rise Patience, MD   81 mg at 11/17/17 1005  . donepezil (ARICEPT) tablet 5 mg  5 mg Oral QHS Rise Patience, MD   5 mg at 11/16/17 2045  . enoxaparin (LOVENOX) injection  40 mg  40 mg Subcutaneous Q24H Rise Patience, MD   40 mg at 11/17/17 1005  . hydrALAZINE (APRESOLINE) injection 5 mg  5 mg Intravenous Q4H PRN Rise Patience, MD   5 mg at 11/15/17 1109  . hydrALAZINE (APRESOLINE) tablet 25 mg  25 mg Oral Q8H Shahmehdi, Seyed A, MD   25 mg at 11/17/17 0625  . latanoprost (XALATAN) 0.005 % ophthalmic solution 1 drop  1 drop Both Eyes QHS Rise Patience, MD   1 drop at 11/16/17 2046  . ondansetron (ZOFRAN) tablet 4 mg  4 mg Oral Q6H PRN Rise Patience, MD       Or  . ondansetron [facility]) injection 4 mg  4 mg Intravenous Q6H PRN Rise Patience, MD      . polyethylene glycol (MIRALAX / GLYCOLAX) packet 17 g  17 g Oral Daily PRN Rise Patience, MD      . pravastatin (PRAVACHOL) tablet 40 mg  40 mg Oral Daily Rise Patience, MD   40 mg at 11/17/17 1005  . sulfamethoxazole-trimethoprim (BACTRIM DS,SEPTRA DS) 800-160 MG per tablet 1 tablet  1 tablet Oral Q12H Irene Pap N, DO   1 tablet at 11/17/17 1238     Discharge Medications: Please see discharge summary for a list of discharge medications.  Relevant Imaging Results:  Relevant Lab Results:   Additional Information SSN: [id number]  Estanislado Emms, LCSW

## 2017-11-19 ENCOUNTER — Encounter: Payer: Self-pay | Admitting: Internal Medicine

## 2017-11-19 ENCOUNTER — Non-Acute Institutional Stay (SKILLED_NURSING_FACILITY): Payer: Medicare Other | Admitting: Internal Medicine

## 2017-11-19 DIAGNOSIS — F0150 Vascular dementia without behavioral disturbance: Secondary | ICD-10-CM | POA: Diagnosis not present

## 2017-11-19 DIAGNOSIS — R296 Repeated falls: Secondary | ICD-10-CM | POA: Diagnosis not present

## 2017-11-19 DIAGNOSIS — G20 Parkinson's disease: Secondary | ICD-10-CM | POA: Diagnosis not present

## 2017-11-19 DIAGNOSIS — R258 Other abnormal involuntary movements: Secondary | ICD-10-CM | POA: Diagnosis not present

## 2017-11-19 DIAGNOSIS — Z9181 History of falling: Secondary | ICD-10-CM | POA: Diagnosis not present

## 2017-11-19 DIAGNOSIS — N3281 Overactive bladder: Secondary | ICD-10-CM

## 2017-11-19 DIAGNOSIS — R42 Dizziness and giddiness: Secondary | ICD-10-CM | POA: Diagnosis not present

## 2017-11-19 DIAGNOSIS — F0280 Dementia in other diseases classified elsewhere without behavioral disturbance: Secondary | ICD-10-CM | POA: Diagnosis not present

## 2017-11-19 DIAGNOSIS — K5909 Other constipation: Secondary | ICD-10-CM | POA: Diagnosis not present

## 2017-11-19 DIAGNOSIS — E860 Dehydration: Secondary | ICD-10-CM | POA: Diagnosis not present

## 2017-11-19 DIAGNOSIS — G20C Parkinsonism, unspecified: Secondary | ICD-10-CM

## 2017-11-19 DIAGNOSIS — M79604 Pain in right leg: Secondary | ICD-10-CM | POA: Diagnosis not present

## 2017-11-19 DIAGNOSIS — R2681 Unsteadiness on feet: Secondary | ICD-10-CM | POA: Diagnosis not present

## 2017-11-19 DIAGNOSIS — I10 Essential (primary) hypertension: Secondary | ICD-10-CM | POA: Diagnosis not present

## 2017-11-19 DIAGNOSIS — H4089 Other specified glaucoma: Secondary | ICD-10-CM | POA: Diagnosis not present

## 2017-11-19 DIAGNOSIS — M62562 Muscle wasting and atrophy, not elsewhere classified, left lower leg: Secondary | ICD-10-CM | POA: Diagnosis not present

## 2017-11-19 DIAGNOSIS — M62561 Muscle wasting and atrophy, not elsewhere classified, right lower leg: Secondary | ICD-10-CM | POA: Diagnosis not present

## 2017-11-19 DIAGNOSIS — M6389 Disorders of muscle in diseases classified elsewhere, multiple sites: Secondary | ICD-10-CM | POA: Diagnosis not present

## 2017-11-19 DIAGNOSIS — N390 Urinary tract infection, site not specified: Secondary | ICD-10-CM | POA: Diagnosis not present

## 2017-11-19 DIAGNOSIS — Z87440 Personal history of urinary (tract) infections: Secondary | ICD-10-CM | POA: Diagnosis not present

## 2017-11-19 DIAGNOSIS — H53453 Other localized visual field defect, bilateral: Secondary | ICD-10-CM | POA: Diagnosis not present

## 2017-11-19 DIAGNOSIS — M79605 Pain in left leg: Secondary | ICD-10-CM | POA: Diagnosis not present

## 2017-11-19 DIAGNOSIS — R278 Other lack of coordination: Secondary | ICD-10-CM | POA: Diagnosis not present

## 2017-11-19 DIAGNOSIS — R2689 Other abnormalities of gait and mobility: Secondary | ICD-10-CM | POA: Diagnosis not present

## 2017-11-19 NOTE — Progress Notes (Signed)
Patient ID: Kaitlyn Ortiz, female   DOB: 1931-08-25, 84 y.o.   MRN: [id number]  Provider:  Rexene Edison. [name], D.O., C.M.D. Location:  [place] Room Number: [place] of Service:  SNF (31)  PCP: Gayland Curry, DO Patient Care Team: Gayland Curry, DO as PCP - General (Geriatric Medicine) Bjorn Loser, MD as Consulting Physician (Urology)  Extended Emergency Contact Information Primary Emergency Contact: [facility] Address: [address]          [place], [place] [address] [name] of [place] Phone: [phone] Work Phone: [phone] Mobile Phone: [phone] Relation: Daughter Secondary Emergency Contact: Haig Prophet Mobile Phone: [phone] Relation: Daughter  Code Status: DNR Goals of Care: Advanced Directive information Advanced Directives 11/19/2017  Does Patient Have a Medical Advance Directive? Yes  Type of Paramedic of [place];Living will;Out of facility DNR (pink MOST or yellow form)  Does patient want to make changes to medical advance directive? No - Patient declined  Copy of [place] in Chart? Yes  Pre-existing out of facility DNR order (yellow form or pink MOST form) Yellow form placed in chart (order not valid for inpatient use)   Chief Complaint  Patient presents with  . New Admit To SNF    Rehab admission    HPI: Patient is a 84 y.o. female seen today for admission to [facility] rehab s/p fall with a trip to the ED 11/14/17.  Pt has been having frequent falls and has been referred to neurology for an assessment of her parkinsonism.  Her vision and balance are poor, she's tremulous.  I had also just added a bp med back to her regimen b/c [facility] nursing had provided multiple high bp results warranting medication.  In the ED, she was diagnosed with "AKI/dehydration" and imdur (previously for chest pain) and the new irbesartan were held.  Amlodipine 5mg   (vasodilator) was restarted instead.  Her trimethoprim prophylaxis was stopped (probably the cause of the increased cr).  She was treated for a "UTI" with IV rocephin which was then changed to bactrim DS po bid for 7 days.    She is bored, does not like being in rehab vs her apt in [place].  She did not have a good recollection of her fall.    Past Medical History:  Diagnosis Date  . Bronchiectasis ([place])   . Cognitive changes    mild  . Glaucoma   . Heart attack ([place])    mild  . Hypertension   . Incontinence    Past Surgical History:  Procedure Laterality Date  . CATARACT EXTRACTION  1994  . CATARACT EXTRACTION  2006    reports that she has never smoked. She has never used smokeless tobacco. She reports that she does not drink alcohol or use drugs. Social History   Socioeconomic History  . Marital status: Single    Spouse name: Not on file  . Number of children: Not on file  . Years of education: Not on file  . Highest education level: Not on file  Occupational History  . Occupation: publishing  Social Needs  . Financial resource strain: Not hard at all  . Food insecurity:    Worry: Never true    Inability: Never true  . Transportation needs:    Medical: No    Non-medical: No  Tobacco Use  . Smoking status: Never Smoker  . Smokeless tobacco: Never Used  Substance and Sexual Activity  . Alcohol use:  No  . Drug use: No  . Sexual activity: Not Currently  Lifestyle  . Physical activity:    Days per week: 0 days    Minutes per session: 0 min  . Stress: Only a little  Relationships  . Social connections:    Talks on phone: Twice a week    Gets together: Twice a week    Attends religious service: Never    Active member of club or organization: No    Attends meetings of clubs or organizations: Never    Relationship status: Never married  . Intimate partner violence:    Fear of current or ex partner: No    Emotionally abused: No    Physically abused: No    Forced  sexual activity: No  Other Topics Concern  . Not on file  Social History Narrative   Social History      Diet? Lots of fruits and veggies      Do you drink/eat things with caffeine? no      Marital status?                            single        What year were you married? 1960      Do you live in a house, apartment, assisted living, condo, trailer, etc.? apartment      Is it one or more stories? one      How many persons live in your home? one      Do you have any pets in your home? (please list) no      Highest level of education completed? masters      Current or past profession: publishing      Advanced Directives      Do you exercise?           yes                           Type & how often? Walking- daily      Do you have a living will? yes      Do you have a DNR form?                                  If not, do you want to discuss one? no      Do you have signed POA/HPOA for forms? yes      Functional Status      Do you have difficulty bathing or dressing yourself? no      Do you have difficulty preparing food or eating?  no      Do you have difficulty managing your medications? no      Do you have difficulty managing your finances? no      Do you have difficulty affording your medications? no    Functional Status Survey:    Family History  Problem Relation Age of Onset  . Stroke Father     Health Maintenance  Topic Date Due  . INFLUENZA VACCINE  11/28/2017  . TETANUS/TDAP  10/02/2027  . DEXA SCAN  Completed  . PNA vac Low Risk Adult  Completed    Allergies  Allergen Reactions  . Caffeine     Outpatient Encounter Medications as of 11/19/2017  Medication Sig  . acetaminophen (TYLENOL) 325 MG tablet Take 650  mg by mouth every 4 (four) hours as needed.  Marland Kitchen amLODipine (NORVASC) 5 MG tablet Take 1 tablet (5 mg total) by mouth daily.  . Ascorbic Acid (VITAMIN C) 1000 MG tablet Take 1,000 mg by mouth daily.  Marland Kitchen aspirin 81 MG chewable tablet Chew  1 tablet by mouth daily.  . Cholecalciferol (VITAMIN D3) 5000 units CAPS Take 1 capsule (5,000 Units total) by mouth daily.  Marland Kitchen donepezil (ARICEPT) 5 MG tablet Take 1 tablet (5 mg total) by mouth at bedtime.  . Glucosamine-Chondroit-Vit C-Mn (GLUCOSAMINE 1500 COMPLEX PO) Take 1-2 tablets by mouth daily.  Marland Kitchen latanoprost (XALATAN) 0.005 % ophthalmic solution Place 1 drop into both eyes at bedtime.  . Lutein 6 MG CAPS Take 1 capsule by mouth daily.  . Multiple Vitamin (MULTIVITAMIN) tablet Take 1 tablet by mouth daily.  . polyethylene glycol (MIRALAX / GLYCOLAX) packet Take 17 g by mouth daily as needed.  . pravastatin (PRAVACHOL) 40 MG tablet TAKE 1 TABLET ONCE DAILY.  Marland Kitchen senna-docusate (SENEXON-S) 8.6-50 MG tablet Take 1 tablet by mouth daily.  Marland Kitchen sulfamethoxazole-trimethoprim (BACTRIM DS,SEPTRA DS) 800-160 MG tablet Take 1 tablet by mouth 2 (two) times daily for 7 days.   No facility-administered encounter medications on file as of 11/19/2017.     Review of Systems  Constitutional: Positive for activity change. Negative for appetite change, chills and fever.  HENT: Negative for congestion.   Eyes: Positive for visual disturbance.  Respiratory: Negative for chest tightness and shortness of breath.   Cardiovascular: Negative for chest pain, palpitations and leg swelling.  Gastrointestinal: Positive for constipation.  Genitourinary: Negative for dysuria.  Musculoskeletal: Positive for gait problem. Negative for arthralgias and back pain.  Skin: Negative for color change.  Neurological: Positive for tremors. Negative for dizziness, speech difficulty and weakness.  Psychiatric/Behavioral: Positive for confusion. Negative for agitation and behavioral problems.    Vitals:   11/19/17 0938  BP: 124/85  Pulse: 85  Resp: 18  Temp: 98.6 F (37 C)  TempSrc: Oral  SpO2: 97%  Weight: 144 lb (65.3 kg)  Height: 5\' 8"  (1.727 m)   Body mass index is 21.9 kg/m. Physical Exam  Constitutional: She  appears well-developed and well-nourished. No distress.  Cardiovascular: Normal rate, regular rhythm, normal heart sounds and intact distal pulses.  Pulmonary/Chest: Effort normal and breath sounds normal. She has no wheezes.  Abdominal: Soft. Bowel sounds are normal. She exhibits no distension. There is no tenderness.  Musculoskeletal:  Using walker (but forgets at times)  Neurological: She is alert. No cranial nerve deficit. She exhibits abnormal muscle tone.  Oriented to person and wellspring, but short term memory loss and confusion; masked facies, tremor  Skin: Skin is warm and dry.    Labs reviewed: Basic Metabolic Panel: Recent Labs    11/14/17 2357 11/15/17 0448 11/17/17 0401  NA 140 140 139  K 4.2 3.9 3.8  CL 106 106 105  CO2 25 26 26   GLUCOSE 124* 109* 110*  BUN 24* 22 19  CREATININE 1.11* 0.90 0.87  CALCIUM 9.3 8.9 8.9  MG  --   --  2.0   Liver Function Tests: Recent Labs    01/01/17 0700 11/14/17 2357  AST 20 32  ALT 16 24  ALKPHOS 62 87  BILITOT  --  0.9  PROT  --  7.0  ALBUMIN  --  4.0   No results for input(s): LIPASE, AMYLASE in the last 8760 hours. No results for input(s): AMMONIA in the last 8760  hours. CBC: Recent Labs    11/14/17 2357 11/15/17 0448 11/17/17 0401  WBC 8.3 6.3 6.1  NEUTROABS 6.8  --   --   HGB 11.9* 11.7* 12.8  HCT 35.3* 35.4* 38.1  MCV 91.5 91.0 92.3  PLT 194 207 199   Cardiac Enzymes: No results for input(s): CKTOTAL, CKMB, CKMBINDEX, TROPONINI in the last 8760 hours. BNP: Invalid input(s): POCBNP No results found for: HGBA1C Lab Results  Component Value Date   TSH 2.89 01/01/2017   Lab Results  Component Value Date   VITAMINB12 550 01/01/2017   No results found for: FOLATE No results found for: IRON, TIBC, FERRITIN  Imaging and Procedures obtained prior to SNF admission: Dg Chest 2 View  Result Date: 11/14/2017 CLINICAL DATA:  84 year old female with fall and left hip pain. EXAM: CHEST - 2 VIEW COMPARISON:   Chest CT dated 07/27/2017 FINDINGS: Mild bronchiectatic changes. There is no focal consolidation, pleural effusion, pneumothorax. The cardiac silhouette is within normal limits. No acute osseous pathology. IMPRESSION: No active cardiopulmonary disease. Electronically Signed   By: Anner Crete M.D.   On: 11/14/2017 23:59   Ct Head Wo Contrast  Result Date: 11/14/2017 CLINICAL DATA:  84 year old female with fall and C-spine trauma. EXAM: CT HEAD WITHOUT CONTRAST CT CERVICAL SPINE WITHOUT CONTRAST TECHNIQUE: Multidetector CT imaging of the head and cervical spine was performed following the standard protocol without intravenous contrast. Multiplanar CT image reconstructions of the cervical spine were also generated. COMPARISON:  Head CT dated 01/29/2017 FINDINGS: CT HEAD FINDINGS Brain: The ventricles and sulci appropriate size for patient's age. Minimal periventricular and deep white matter chronic microvascular ischemic changes noted. There is no acute intracranial hemorrhage. No mass effect or midline shift noted. No extra-axial fluid collection. Vascular: No hyperdense vessel or unexpected calcification. Skull: Normal. Negative for fracture or focal lesion. Sinuses/Orbits: No acute finding. Other: Multiple areas of calcification over the scalp as seen on the prior CT. CT CERVICAL SPINE FINDINGS Alignment: No acute subluxation. Skull base and vertebrae: No acute fracture. No primary bone lesion or focal pathologic process. Soft tissues and spinal canal: No prevertebral fluid or swelling. No visible canal hematoma. Disc levels: Degenerative changes with facet hypertrophy most prominent at C2-C3 on the right and C4-C5. Upper chest: Biapical subpleural scarring. Other: None IMPRESSION: 1. No acute intracranial pathology. 2. No acute/traumatic cervical spine pathology. Electronically Signed   By: Anner Crete M.D.   On: 11/14/2017 22:53   Ct Cervical Spine Wo Contrast  Result Date: 11/14/2017 CLINICAL  DATA:  84 year old female with fall and C-spine trauma. EXAM: CT HEAD WITHOUT CONTRAST CT CERVICAL SPINE WITHOUT CONTRAST TECHNIQUE: Multidetector CT imaging of the head and cervical spine was performed following the standard protocol without intravenous contrast. Multiplanar CT image reconstructions of the cervical spine were also generated. COMPARISON:  Head CT dated 01/29/2017 FINDINGS: CT HEAD FINDINGS Brain: The ventricles and sulci appropriate size for patient's age. Minimal periventricular and deep white matter chronic microvascular ischemic changes noted. There is no acute intracranial hemorrhage. No mass effect or midline shift noted. No extra-axial fluid collection. Vascular: No hyperdense vessel or unexpected calcification. Skull: Normal. Negative for fracture or focal lesion. Sinuses/Orbits: No acute finding. Other: Multiple areas of calcification over the scalp as seen on the prior CT. CT CERVICAL SPINE FINDINGS Alignment: No acute subluxation. Skull base and vertebrae: No acute fracture. No primary bone lesion or focal pathologic process. Soft tissues and spinal canal: No prevertebral fluid or swelling. No visible canal hematoma.  Disc levels: Degenerative changes with facet hypertrophy most prominent at C2-C3 on the right and C4-C5. Upper chest: Biapical subpleural scarring. Other: None IMPRESSION: 1. No acute intracranial pathology. 2. No acute/traumatic cervical spine pathology. Electronically Signed   By: Anner Crete M.D.   On: 11/14/2017 22:53   Mr Brain Wo Contrast  Result Date: 11/15/2017 CLINICAL DATA:  Dizziness for the last several weeks. Fell last night with trauma to the head. EXAM: MRI HEAD WITHOUT CONTRAST TECHNIQUE: Multiplanar, multiecho pulse sequences of the brain and surrounding structures were obtained without intravenous contrast. COMPARISON:  CT studies done yesterday. FINDINGS: Brain: Diffusion imaging does not show any acute or subacute infarction. The brainstem and  cerebellum are normal. Cerebral hemispheres show age related atrophy with mild moderate small vessel change of the white matter. No cortical or large vessel territory infarction. No mass lesion, hemorrhage, hydrocephalus or extra-axial collection. Vascular: Major vessels at the base of the brain show flow. Skull and upper cervical spine: Negative Sinuses/Orbits: Clear/normal. No fluid in the mastoids or middle ears. Other: Presumed sebaceous cysts of the scalp. IMPRESSION: No acute or reversible finding. Age related volume loss. Mild to moderate chronic small-vessel ischemic changes of the cerebral hemispheric white matter. Electronically Signed   By: Nelson Chimes M.D.   On: 11/15/2017 09:03   Dg Hip Unilat W Or Wo Pelvis 2-3 Views Left  Result Date: 11/15/2017 CLINICAL DATA:  84 year old female with fall and left hip pain. EXAM: DG HIP (WITH OR WITHOUT PELVIS) 2-3V LEFT COMPARISON:  None. FINDINGS: No definite acute fracture or dislocation. Mild arthritic changes of the hips. The bones are mildly osteopenic. The soft tissues are grossly unremarkable. There is moderate colonic stool burden. IMPRESSION: No acute fracture or dislocation. Electronically Signed   By: Anner Crete M.D.   On: 11/15/2017 00:01    Assessment/Plan 1. Recurrent falls -variable bp but has not been truly orthostatic -meds reduced again, but had been running very high in afternoons  -monitor bp -here for PT, OT  2. Vascular dementia without behavioral disturbance -rather rapidly progressive since move into [place], then [place] and now in rehab  3. Parkinsonism, unspecified Parkinsonism type ([place]) -awaits neuro eval  4. Benign hypertension -bp this am actually good and she did not c/o dizziness  5. Chronic constipation -cont senna-s, miralax, fiber, fluids, mobility  6. Overactive bladder -is for PTNS with urology, did not benefit from [employer] staff Communication: discussed with rehab nurse  Labs/tests  ordered:  No new  Kerryn Tennant L. Christeen Lai, D.O. [facility] [address], [place] [address] Cell Phone (Mon-Fri 8am-5pm):  [phone] On Call:  [phone] & follow prompts after 5pm & weekends Office Phone:  [phone] Office Fax:  [phone]

## 2017-11-20 ENCOUNTER — Encounter: Payer: Self-pay | Admitting: Internal Medicine

## 2017-11-20 ENCOUNTER — Encounter: Payer: Medicare Other | Admitting: Internal Medicine

## 2017-11-20 DIAGNOSIS — M79605 Pain in left leg: Secondary | ICD-10-CM | POA: Diagnosis not present

## 2017-11-20 DIAGNOSIS — M62562 Muscle wasting and atrophy, not elsewhere classified, left lower leg: Secondary | ICD-10-CM | POA: Diagnosis not present

## 2017-11-20 DIAGNOSIS — M6389 Disorders of muscle in diseases classified elsewhere, multiple sites: Secondary | ICD-10-CM | POA: Diagnosis not present

## 2017-11-20 DIAGNOSIS — R258 Other abnormal involuntary movements: Secondary | ICD-10-CM | POA: Diagnosis not present

## 2017-11-20 DIAGNOSIS — D649 Anemia, unspecified: Secondary | ICD-10-CM | POA: Diagnosis not present

## 2017-11-20 DIAGNOSIS — N189 Chronic kidney disease, unspecified: Secondary | ICD-10-CM | POA: Diagnosis not present

## 2017-11-20 DIAGNOSIS — M62561 Muscle wasting and atrophy, not elsewhere classified, right lower leg: Secondary | ICD-10-CM | POA: Diagnosis not present

## 2017-11-20 DIAGNOSIS — M79604 Pain in right leg: Secondary | ICD-10-CM | POA: Diagnosis not present

## 2017-11-21 DIAGNOSIS — M6389 Disorders of muscle in diseases classified elsewhere, multiple sites: Secondary | ICD-10-CM | POA: Diagnosis not present

## 2017-11-21 DIAGNOSIS — M79604 Pain in right leg: Secondary | ICD-10-CM | POA: Diagnosis not present

## 2017-11-21 DIAGNOSIS — M79605 Pain in left leg: Secondary | ICD-10-CM | POA: Diagnosis not present

## 2017-11-21 DIAGNOSIS — R258 Other abnormal involuntary movements: Secondary | ICD-10-CM | POA: Diagnosis not present

## 2017-11-21 DIAGNOSIS — M62562 Muscle wasting and atrophy, not elsewhere classified, left lower leg: Secondary | ICD-10-CM | POA: Diagnosis not present

## 2017-11-21 DIAGNOSIS — M62561 Muscle wasting and atrophy, not elsewhere classified, right lower leg: Secondary | ICD-10-CM | POA: Diagnosis not present

## 2017-11-22 DIAGNOSIS — R258 Other abnormal involuntary movements: Secondary | ICD-10-CM | POA: Diagnosis not present

## 2017-11-22 DIAGNOSIS — M62562 Muscle wasting and atrophy, not elsewhere classified, left lower leg: Secondary | ICD-10-CM | POA: Diagnosis not present

## 2017-11-22 DIAGNOSIS — M79604 Pain in right leg: Secondary | ICD-10-CM | POA: Diagnosis not present

## 2017-11-22 DIAGNOSIS — M6389 Disorders of muscle in diseases classified elsewhere, multiple sites: Secondary | ICD-10-CM | POA: Diagnosis not present

## 2017-11-22 DIAGNOSIS — M79605 Pain in left leg: Secondary | ICD-10-CM | POA: Diagnosis not present

## 2017-11-22 DIAGNOSIS — M62561 Muscle wasting and atrophy, not elsewhere classified, right lower leg: Secondary | ICD-10-CM | POA: Diagnosis not present

## 2017-11-25 DIAGNOSIS — M79604 Pain in right leg: Secondary | ICD-10-CM | POA: Diagnosis not present

## 2017-11-25 DIAGNOSIS — M62561 Muscle wasting and atrophy, not elsewhere classified, right lower leg: Secondary | ICD-10-CM | POA: Diagnosis not present

## 2017-11-25 DIAGNOSIS — R258 Other abnormal involuntary movements: Secondary | ICD-10-CM | POA: Diagnosis not present

## 2017-11-25 DIAGNOSIS — M6389 Disorders of muscle in diseases classified elsewhere, multiple sites: Secondary | ICD-10-CM | POA: Diagnosis not present

## 2017-11-25 DIAGNOSIS — M62562 Muscle wasting and atrophy, not elsewhere classified, left lower leg: Secondary | ICD-10-CM | POA: Diagnosis not present

## 2017-11-25 DIAGNOSIS — M79605 Pain in left leg: Secondary | ICD-10-CM | POA: Diagnosis not present

## 2017-11-26 DIAGNOSIS — M79605 Pain in left leg: Secondary | ICD-10-CM | POA: Diagnosis not present

## 2017-11-26 DIAGNOSIS — M62562 Muscle wasting and atrophy, not elsewhere classified, left lower leg: Secondary | ICD-10-CM | POA: Diagnosis not present

## 2017-11-26 DIAGNOSIS — M6389 Disorders of muscle in diseases classified elsewhere, multiple sites: Secondary | ICD-10-CM | POA: Diagnosis not present

## 2017-11-26 DIAGNOSIS — R258 Other abnormal involuntary movements: Secondary | ICD-10-CM | POA: Diagnosis not present

## 2017-11-26 DIAGNOSIS — M62561 Muscle wasting and atrophy, not elsewhere classified, right lower leg: Secondary | ICD-10-CM | POA: Diagnosis not present

## 2017-11-26 DIAGNOSIS — M79604 Pain in right leg: Secondary | ICD-10-CM | POA: Diagnosis not present

## 2017-11-27 DIAGNOSIS — M6389 Disorders of muscle in diseases classified elsewhere, multiple sites: Secondary | ICD-10-CM | POA: Diagnosis not present

## 2017-11-27 DIAGNOSIS — M79604 Pain in right leg: Secondary | ICD-10-CM | POA: Diagnosis not present

## 2017-11-27 DIAGNOSIS — M62562 Muscle wasting and atrophy, not elsewhere classified, left lower leg: Secondary | ICD-10-CM | POA: Diagnosis not present

## 2017-11-27 DIAGNOSIS — M62561 Muscle wasting and atrophy, not elsewhere classified, right lower leg: Secondary | ICD-10-CM | POA: Diagnosis not present

## 2017-11-27 DIAGNOSIS — R258 Other abnormal involuntary movements: Secondary | ICD-10-CM | POA: Diagnosis not present

## 2017-11-27 DIAGNOSIS — M79605 Pain in left leg: Secondary | ICD-10-CM | POA: Diagnosis not present

## 2017-11-28 DIAGNOSIS — N390 Urinary tract infection, site not specified: Secondary | ICD-10-CM | POA: Diagnosis not present

## 2017-11-28 DIAGNOSIS — M6389 Disorders of muscle in diseases classified elsewhere, multiple sites: Secondary | ICD-10-CM | POA: Diagnosis not present

## 2017-11-28 DIAGNOSIS — E860 Dehydration: Secondary | ICD-10-CM | POA: Diagnosis not present

## 2017-11-28 DIAGNOSIS — H4089 Other specified glaucoma: Secondary | ICD-10-CM | POA: Diagnosis not present

## 2017-11-28 DIAGNOSIS — M62562 Muscle wasting and atrophy, not elsewhere classified, left lower leg: Secondary | ICD-10-CM | POA: Diagnosis not present

## 2017-11-28 DIAGNOSIS — I10 Essential (primary) hypertension: Secondary | ICD-10-CM | POA: Diagnosis not present

## 2017-11-28 DIAGNOSIS — F0280 Dementia in other diseases classified elsewhere without behavioral disturbance: Secondary | ICD-10-CM | POA: Diagnosis not present

## 2017-11-28 DIAGNOSIS — R278 Other lack of coordination: Secondary | ICD-10-CM | POA: Diagnosis not present

## 2017-11-28 DIAGNOSIS — H53453 Other localized visual field defect, bilateral: Secondary | ICD-10-CM | POA: Diagnosis not present

## 2017-11-28 DIAGNOSIS — R42 Dizziness and giddiness: Secondary | ICD-10-CM | POA: Diagnosis not present

## 2017-11-28 DIAGNOSIS — R2681 Unsteadiness on feet: Secondary | ICD-10-CM | POA: Diagnosis not present

## 2017-11-28 DIAGNOSIS — M62561 Muscle wasting and atrophy, not elsewhere classified, right lower leg: Secondary | ICD-10-CM | POA: Diagnosis not present

## 2017-11-28 DIAGNOSIS — R296 Repeated falls: Secondary | ICD-10-CM | POA: Diagnosis not present

## 2017-11-28 DIAGNOSIS — M79604 Pain in right leg: Secondary | ICD-10-CM | POA: Diagnosis not present

## 2017-11-28 DIAGNOSIS — Z87440 Personal history of urinary (tract) infections: Secondary | ICD-10-CM | POA: Diagnosis not present

## 2017-11-28 DIAGNOSIS — Z9181 History of falling: Secondary | ICD-10-CM | POA: Diagnosis not present

## 2017-11-28 DIAGNOSIS — M79605 Pain in left leg: Secondary | ICD-10-CM | POA: Diagnosis not present

## 2017-11-28 DIAGNOSIS — R258 Other abnormal involuntary movements: Secondary | ICD-10-CM | POA: Diagnosis not present

## 2017-11-28 DIAGNOSIS — R2689 Other abnormalities of gait and mobility: Secondary | ICD-10-CM | POA: Diagnosis not present

## 2017-11-29 DIAGNOSIS — R258 Other abnormal involuntary movements: Secondary | ICD-10-CM | POA: Diagnosis not present

## 2017-11-29 DIAGNOSIS — M79605 Pain in left leg: Secondary | ICD-10-CM | POA: Diagnosis not present

## 2017-11-29 DIAGNOSIS — M62562 Muscle wasting and atrophy, not elsewhere classified, left lower leg: Secondary | ICD-10-CM | POA: Diagnosis not present

## 2017-11-29 DIAGNOSIS — M79604 Pain in right leg: Secondary | ICD-10-CM | POA: Diagnosis not present

## 2017-11-29 DIAGNOSIS — M6389 Disorders of muscle in diseases classified elsewhere, multiple sites: Secondary | ICD-10-CM | POA: Diagnosis not present

## 2017-11-29 DIAGNOSIS — M62561 Muscle wasting and atrophy, not elsewhere classified, right lower leg: Secondary | ICD-10-CM | POA: Diagnosis not present

## 2017-12-01 DIAGNOSIS — R258 Other abnormal involuntary movements: Secondary | ICD-10-CM | POA: Diagnosis not present

## 2017-12-01 DIAGNOSIS — M62561 Muscle wasting and atrophy, not elsewhere classified, right lower leg: Secondary | ICD-10-CM | POA: Diagnosis not present

## 2017-12-01 DIAGNOSIS — M6389 Disorders of muscle in diseases classified elsewhere, multiple sites: Secondary | ICD-10-CM | POA: Diagnosis not present

## 2017-12-01 DIAGNOSIS — M62562 Muscle wasting and atrophy, not elsewhere classified, left lower leg: Secondary | ICD-10-CM | POA: Diagnosis not present

## 2017-12-01 DIAGNOSIS — M79605 Pain in left leg: Secondary | ICD-10-CM | POA: Diagnosis not present

## 2017-12-01 DIAGNOSIS — M79604 Pain in right leg: Secondary | ICD-10-CM | POA: Diagnosis not present

## 2017-12-02 DIAGNOSIS — R258 Other abnormal involuntary movements: Secondary | ICD-10-CM | POA: Diagnosis not present

## 2017-12-02 DIAGNOSIS — M62562 Muscle wasting and atrophy, not elsewhere classified, left lower leg: Secondary | ICD-10-CM | POA: Diagnosis not present

## 2017-12-02 DIAGNOSIS — M6389 Disorders of muscle in diseases classified elsewhere, multiple sites: Secondary | ICD-10-CM | POA: Diagnosis not present

## 2017-12-02 DIAGNOSIS — M79604 Pain in right leg: Secondary | ICD-10-CM | POA: Diagnosis not present

## 2017-12-02 DIAGNOSIS — M79605 Pain in left leg: Secondary | ICD-10-CM | POA: Diagnosis not present

## 2017-12-02 DIAGNOSIS — M62561 Muscle wasting and atrophy, not elsewhere classified, right lower leg: Secondary | ICD-10-CM | POA: Diagnosis not present

## 2017-12-03 DIAGNOSIS — M79604 Pain in right leg: Secondary | ICD-10-CM | POA: Diagnosis not present

## 2017-12-03 DIAGNOSIS — M6389 Disorders of muscle in diseases classified elsewhere, multiple sites: Secondary | ICD-10-CM | POA: Diagnosis not present

## 2017-12-03 DIAGNOSIS — R258 Other abnormal involuntary movements: Secondary | ICD-10-CM | POA: Diagnosis not present

## 2017-12-03 DIAGNOSIS — M62562 Muscle wasting and atrophy, not elsewhere classified, left lower leg: Secondary | ICD-10-CM | POA: Diagnosis not present

## 2017-12-03 DIAGNOSIS — M79605 Pain in left leg: Secondary | ICD-10-CM | POA: Diagnosis not present

## 2017-12-03 DIAGNOSIS — M62561 Muscle wasting and atrophy, not elsewhere classified, right lower leg: Secondary | ICD-10-CM | POA: Diagnosis not present

## 2017-12-04 DIAGNOSIS — R258 Other abnormal involuntary movements: Secondary | ICD-10-CM | POA: Diagnosis not present

## 2017-12-04 DIAGNOSIS — M79604 Pain in right leg: Secondary | ICD-10-CM | POA: Diagnosis not present

## 2017-12-04 DIAGNOSIS — M62561 Muscle wasting and atrophy, not elsewhere classified, right lower leg: Secondary | ICD-10-CM | POA: Diagnosis not present

## 2017-12-04 DIAGNOSIS — M79605 Pain in left leg: Secondary | ICD-10-CM | POA: Diagnosis not present

## 2017-12-04 DIAGNOSIS — M6389 Disorders of muscle in diseases classified elsewhere, multiple sites: Secondary | ICD-10-CM | POA: Diagnosis not present

## 2017-12-04 DIAGNOSIS — M62562 Muscle wasting and atrophy, not elsewhere classified, left lower leg: Secondary | ICD-10-CM | POA: Diagnosis not present

## 2017-12-05 DIAGNOSIS — M79604 Pain in right leg: Secondary | ICD-10-CM | POA: Diagnosis not present

## 2017-12-05 DIAGNOSIS — M79605 Pain in left leg: Secondary | ICD-10-CM | POA: Diagnosis not present

## 2017-12-05 DIAGNOSIS — R350 Frequency of micturition: Secondary | ICD-10-CM | POA: Diagnosis not present

## 2017-12-05 DIAGNOSIS — R258 Other abnormal involuntary movements: Secondary | ICD-10-CM | POA: Diagnosis not present

## 2017-12-05 DIAGNOSIS — M6389 Disorders of muscle in diseases classified elsewhere, multiple sites: Secondary | ICD-10-CM | POA: Diagnosis not present

## 2017-12-05 DIAGNOSIS — R3914 Feeling of incomplete bladder emptying: Secondary | ICD-10-CM | POA: Diagnosis not present

## 2017-12-05 DIAGNOSIS — M62562 Muscle wasting and atrophy, not elsewhere classified, left lower leg: Secondary | ICD-10-CM | POA: Diagnosis not present

## 2017-12-05 DIAGNOSIS — M62561 Muscle wasting and atrophy, not elsewhere classified, right lower leg: Secondary | ICD-10-CM | POA: Diagnosis not present

## 2017-12-05 DIAGNOSIS — N3946 Mixed incontinence: Secondary | ICD-10-CM | POA: Diagnosis not present

## 2017-12-05 DIAGNOSIS — N3941 Urge incontinence: Secondary | ICD-10-CM | POA: Diagnosis not present

## 2017-12-06 DIAGNOSIS — R258 Other abnormal involuntary movements: Secondary | ICD-10-CM | POA: Diagnosis not present

## 2017-12-06 DIAGNOSIS — M79604 Pain in right leg: Secondary | ICD-10-CM | POA: Diagnosis not present

## 2017-12-06 DIAGNOSIS — M79605 Pain in left leg: Secondary | ICD-10-CM | POA: Diagnosis not present

## 2017-12-06 DIAGNOSIS — M62561 Muscle wasting and atrophy, not elsewhere classified, right lower leg: Secondary | ICD-10-CM | POA: Diagnosis not present

## 2017-12-06 DIAGNOSIS — M62562 Muscle wasting and atrophy, not elsewhere classified, left lower leg: Secondary | ICD-10-CM | POA: Diagnosis not present

## 2017-12-06 DIAGNOSIS — M6389 Disorders of muscle in diseases classified elsewhere, multiple sites: Secondary | ICD-10-CM | POA: Diagnosis not present

## 2017-12-09 DIAGNOSIS — M79605 Pain in left leg: Secondary | ICD-10-CM | POA: Diagnosis not present

## 2017-12-09 DIAGNOSIS — M6389 Disorders of muscle in diseases classified elsewhere, multiple sites: Secondary | ICD-10-CM | POA: Diagnosis not present

## 2017-12-09 DIAGNOSIS — M79604 Pain in right leg: Secondary | ICD-10-CM | POA: Diagnosis not present

## 2017-12-09 DIAGNOSIS — M62562 Muscle wasting and atrophy, not elsewhere classified, left lower leg: Secondary | ICD-10-CM | POA: Diagnosis not present

## 2017-12-09 DIAGNOSIS — R258 Other abnormal involuntary movements: Secondary | ICD-10-CM | POA: Diagnosis not present

## 2017-12-09 DIAGNOSIS — M62561 Muscle wasting and atrophy, not elsewhere classified, right lower leg: Secondary | ICD-10-CM | POA: Diagnosis not present

## 2017-12-10 DIAGNOSIS — R258 Other abnormal involuntary movements: Secondary | ICD-10-CM | POA: Diagnosis not present

## 2017-12-10 DIAGNOSIS — M62561 Muscle wasting and atrophy, not elsewhere classified, right lower leg: Secondary | ICD-10-CM | POA: Diagnosis not present

## 2017-12-10 DIAGNOSIS — M62562 Muscle wasting and atrophy, not elsewhere classified, left lower leg: Secondary | ICD-10-CM | POA: Diagnosis not present

## 2017-12-10 DIAGNOSIS — M79604 Pain in right leg: Secondary | ICD-10-CM | POA: Diagnosis not present

## 2017-12-10 DIAGNOSIS — M79605 Pain in left leg: Secondary | ICD-10-CM | POA: Diagnosis not present

## 2017-12-10 DIAGNOSIS — M6389 Disorders of muscle in diseases classified elsewhere, multiple sites: Secondary | ICD-10-CM | POA: Diagnosis not present

## 2017-12-11 DIAGNOSIS — M62561 Muscle wasting and atrophy, not elsewhere classified, right lower leg: Secondary | ICD-10-CM | POA: Diagnosis not present

## 2017-12-11 DIAGNOSIS — M62562 Muscle wasting and atrophy, not elsewhere classified, left lower leg: Secondary | ICD-10-CM | POA: Diagnosis not present

## 2017-12-11 DIAGNOSIS — M79604 Pain in right leg: Secondary | ICD-10-CM | POA: Diagnosis not present

## 2017-12-11 DIAGNOSIS — R258 Other abnormal involuntary movements: Secondary | ICD-10-CM | POA: Diagnosis not present

## 2017-12-11 DIAGNOSIS — M79605 Pain in left leg: Secondary | ICD-10-CM | POA: Diagnosis not present

## 2017-12-11 DIAGNOSIS — M6389 Disorders of muscle in diseases classified elsewhere, multiple sites: Secondary | ICD-10-CM | POA: Diagnosis not present

## 2017-12-12 DIAGNOSIS — M79605 Pain in left leg: Secondary | ICD-10-CM | POA: Diagnosis not present

## 2017-12-12 DIAGNOSIS — M62561 Muscle wasting and atrophy, not elsewhere classified, right lower leg: Secondary | ICD-10-CM | POA: Diagnosis not present

## 2017-12-12 DIAGNOSIS — R258 Other abnormal involuntary movements: Secondary | ICD-10-CM | POA: Diagnosis not present

## 2017-12-12 DIAGNOSIS — M6389 Disorders of muscle in diseases classified elsewhere, multiple sites: Secondary | ICD-10-CM | POA: Diagnosis not present

## 2017-12-12 DIAGNOSIS — N3941 Urge incontinence: Secondary | ICD-10-CM | POA: Diagnosis not present

## 2017-12-12 DIAGNOSIS — M79604 Pain in right leg: Secondary | ICD-10-CM | POA: Diagnosis not present

## 2017-12-12 DIAGNOSIS — M62562 Muscle wasting and atrophy, not elsewhere classified, left lower leg: Secondary | ICD-10-CM | POA: Diagnosis not present

## 2017-12-13 DIAGNOSIS — M62562 Muscle wasting and atrophy, not elsewhere classified, left lower leg: Secondary | ICD-10-CM | POA: Diagnosis not present

## 2017-12-13 DIAGNOSIS — M62561 Muscle wasting and atrophy, not elsewhere classified, right lower leg: Secondary | ICD-10-CM | POA: Diagnosis not present

## 2017-12-13 DIAGNOSIS — R258 Other abnormal involuntary movements: Secondary | ICD-10-CM | POA: Diagnosis not present

## 2017-12-13 DIAGNOSIS — M6389 Disorders of muscle in diseases classified elsewhere, multiple sites: Secondary | ICD-10-CM | POA: Diagnosis not present

## 2017-12-13 DIAGNOSIS — M79605 Pain in left leg: Secondary | ICD-10-CM | POA: Diagnosis not present

## 2017-12-13 DIAGNOSIS — M79604 Pain in right leg: Secondary | ICD-10-CM | POA: Diagnosis not present

## 2017-12-16 DIAGNOSIS — R258 Other abnormal involuntary movements: Secondary | ICD-10-CM | POA: Diagnosis not present

## 2017-12-16 DIAGNOSIS — M79605 Pain in left leg: Secondary | ICD-10-CM | POA: Diagnosis not present

## 2017-12-16 DIAGNOSIS — M62561 Muscle wasting and atrophy, not elsewhere classified, right lower leg: Secondary | ICD-10-CM | POA: Diagnosis not present

## 2017-12-16 DIAGNOSIS — M6389 Disorders of muscle in diseases classified elsewhere, multiple sites: Secondary | ICD-10-CM | POA: Diagnosis not present

## 2017-12-16 DIAGNOSIS — M62562 Muscle wasting and atrophy, not elsewhere classified, left lower leg: Secondary | ICD-10-CM | POA: Diagnosis not present

## 2017-12-16 DIAGNOSIS — M79604 Pain in right leg: Secondary | ICD-10-CM | POA: Diagnosis not present

## 2017-12-17 DIAGNOSIS — M79604 Pain in right leg: Secondary | ICD-10-CM | POA: Diagnosis not present

## 2017-12-17 DIAGNOSIS — M62561 Muscle wasting and atrophy, not elsewhere classified, right lower leg: Secondary | ICD-10-CM | POA: Diagnosis not present

## 2017-12-17 DIAGNOSIS — M6389 Disorders of muscle in diseases classified elsewhere, multiple sites: Secondary | ICD-10-CM | POA: Diagnosis not present

## 2017-12-17 DIAGNOSIS — M79605 Pain in left leg: Secondary | ICD-10-CM | POA: Diagnosis not present

## 2017-12-17 DIAGNOSIS — M62562 Muscle wasting and atrophy, not elsewhere classified, left lower leg: Secondary | ICD-10-CM | POA: Diagnosis not present

## 2017-12-17 DIAGNOSIS — R258 Other abnormal involuntary movements: Secondary | ICD-10-CM | POA: Diagnosis not present

## 2017-12-18 DIAGNOSIS — M79604 Pain in right leg: Secondary | ICD-10-CM | POA: Diagnosis not present

## 2017-12-18 DIAGNOSIS — M62561 Muscle wasting and atrophy, not elsewhere classified, right lower leg: Secondary | ICD-10-CM | POA: Diagnosis not present

## 2017-12-18 DIAGNOSIS — R258 Other abnormal involuntary movements: Secondary | ICD-10-CM | POA: Diagnosis not present

## 2017-12-18 DIAGNOSIS — M6389 Disorders of muscle in diseases classified elsewhere, multiple sites: Secondary | ICD-10-CM | POA: Diagnosis not present

## 2017-12-18 DIAGNOSIS — M62562 Muscle wasting and atrophy, not elsewhere classified, left lower leg: Secondary | ICD-10-CM | POA: Diagnosis not present

## 2017-12-18 DIAGNOSIS — M79605 Pain in left leg: Secondary | ICD-10-CM | POA: Diagnosis not present

## 2017-12-19 DIAGNOSIS — R350 Frequency of micturition: Secondary | ICD-10-CM | POA: Diagnosis not present

## 2017-12-20 DIAGNOSIS — M79605 Pain in left leg: Secondary | ICD-10-CM | POA: Diagnosis not present

## 2017-12-20 DIAGNOSIS — M6389 Disorders of muscle in diseases classified elsewhere, multiple sites: Secondary | ICD-10-CM | POA: Diagnosis not present

## 2017-12-20 DIAGNOSIS — M62562 Muscle wasting and atrophy, not elsewhere classified, left lower leg: Secondary | ICD-10-CM | POA: Diagnosis not present

## 2017-12-20 DIAGNOSIS — M79604 Pain in right leg: Secondary | ICD-10-CM | POA: Diagnosis not present

## 2017-12-20 DIAGNOSIS — M62561 Muscle wasting and atrophy, not elsewhere classified, right lower leg: Secondary | ICD-10-CM | POA: Diagnosis not present

## 2017-12-20 DIAGNOSIS — R258 Other abnormal involuntary movements: Secondary | ICD-10-CM | POA: Diagnosis not present

## 2017-12-23 DIAGNOSIS — M6389 Disorders of muscle in diseases classified elsewhere, multiple sites: Secondary | ICD-10-CM | POA: Diagnosis not present

## 2017-12-23 DIAGNOSIS — M79604 Pain in right leg: Secondary | ICD-10-CM | POA: Diagnosis not present

## 2017-12-23 DIAGNOSIS — M62561 Muscle wasting and atrophy, not elsewhere classified, right lower leg: Secondary | ICD-10-CM | POA: Diagnosis not present

## 2017-12-23 DIAGNOSIS — M79605 Pain in left leg: Secondary | ICD-10-CM | POA: Diagnosis not present

## 2017-12-23 DIAGNOSIS — M62562 Muscle wasting and atrophy, not elsewhere classified, left lower leg: Secondary | ICD-10-CM | POA: Diagnosis not present

## 2017-12-23 DIAGNOSIS — R258 Other abnormal involuntary movements: Secondary | ICD-10-CM | POA: Diagnosis not present

## 2017-12-25 DIAGNOSIS — M79604 Pain in right leg: Secondary | ICD-10-CM | POA: Diagnosis not present

## 2017-12-25 DIAGNOSIS — R258 Other abnormal involuntary movements: Secondary | ICD-10-CM | POA: Diagnosis not present

## 2017-12-25 DIAGNOSIS — M79605 Pain in left leg: Secondary | ICD-10-CM | POA: Diagnosis not present

## 2017-12-25 DIAGNOSIS — M62561 Muscle wasting and atrophy, not elsewhere classified, right lower leg: Secondary | ICD-10-CM | POA: Diagnosis not present

## 2017-12-25 DIAGNOSIS — M62562 Muscle wasting and atrophy, not elsewhere classified, left lower leg: Secondary | ICD-10-CM | POA: Diagnosis not present

## 2017-12-25 DIAGNOSIS — M6389 Disorders of muscle in diseases classified elsewhere, multiple sites: Secondary | ICD-10-CM | POA: Diagnosis not present

## 2017-12-26 DIAGNOSIS — M79604 Pain in right leg: Secondary | ICD-10-CM | POA: Diagnosis not present

## 2017-12-26 DIAGNOSIS — M62562 Muscle wasting and atrophy, not elsewhere classified, left lower leg: Secondary | ICD-10-CM | POA: Diagnosis not present

## 2017-12-26 DIAGNOSIS — R350 Frequency of micturition: Secondary | ICD-10-CM | POA: Diagnosis not present

## 2017-12-26 DIAGNOSIS — M62561 Muscle wasting and atrophy, not elsewhere classified, right lower leg: Secondary | ICD-10-CM | POA: Diagnosis not present

## 2017-12-26 DIAGNOSIS — N3941 Urge incontinence: Secondary | ICD-10-CM | POA: Diagnosis not present

## 2017-12-26 DIAGNOSIS — M6389 Disorders of muscle in diseases classified elsewhere, multiple sites: Secondary | ICD-10-CM | POA: Diagnosis not present

## 2017-12-26 DIAGNOSIS — R258 Other abnormal involuntary movements: Secondary | ICD-10-CM | POA: Diagnosis not present

## 2017-12-26 DIAGNOSIS — M79605 Pain in left leg: Secondary | ICD-10-CM | POA: Diagnosis not present

## 2017-12-27 DIAGNOSIS — M62562 Muscle wasting and atrophy, not elsewhere classified, left lower leg: Secondary | ICD-10-CM | POA: Diagnosis not present

## 2017-12-27 DIAGNOSIS — M6389 Disorders of muscle in diseases classified elsewhere, multiple sites: Secondary | ICD-10-CM | POA: Diagnosis not present

## 2017-12-27 DIAGNOSIS — M62561 Muscle wasting and atrophy, not elsewhere classified, right lower leg: Secondary | ICD-10-CM | POA: Diagnosis not present

## 2017-12-27 DIAGNOSIS — M79604 Pain in right leg: Secondary | ICD-10-CM | POA: Diagnosis not present

## 2017-12-27 DIAGNOSIS — M79605 Pain in left leg: Secondary | ICD-10-CM | POA: Diagnosis not present

## 2017-12-27 DIAGNOSIS — R258 Other abnormal involuntary movements: Secondary | ICD-10-CM | POA: Diagnosis not present

## 2017-12-30 DIAGNOSIS — R2681 Unsteadiness on feet: Secondary | ICD-10-CM | POA: Diagnosis not present

## 2017-12-30 DIAGNOSIS — H53453 Other localized visual field defect, bilateral: Secondary | ICD-10-CM | POA: Diagnosis not present

## 2017-12-30 DIAGNOSIS — M79604 Pain in right leg: Secondary | ICD-10-CM | POA: Diagnosis not present

## 2017-12-30 DIAGNOSIS — M79605 Pain in left leg: Secondary | ICD-10-CM | POA: Diagnosis not present

## 2017-12-30 DIAGNOSIS — M6389 Disorders of muscle in diseases classified elsewhere, multiple sites: Secondary | ICD-10-CM | POA: Diagnosis not present

## 2017-12-30 DIAGNOSIS — E860 Dehydration: Secondary | ICD-10-CM | POA: Diagnosis not present

## 2017-12-30 DIAGNOSIS — H4089 Other specified glaucoma: Secondary | ICD-10-CM | POA: Diagnosis not present

## 2017-12-30 DIAGNOSIS — Z87440 Personal history of urinary (tract) infections: Secondary | ICD-10-CM | POA: Diagnosis not present

## 2017-12-30 DIAGNOSIS — R2689 Other abnormalities of gait and mobility: Secondary | ICD-10-CM | POA: Diagnosis not present

## 2017-12-30 DIAGNOSIS — N390 Urinary tract infection, site not specified: Secondary | ICD-10-CM | POA: Diagnosis not present

## 2017-12-30 DIAGNOSIS — R258 Other abnormal involuntary movements: Secondary | ICD-10-CM | POA: Diagnosis not present

## 2017-12-30 DIAGNOSIS — Z9181 History of falling: Secondary | ICD-10-CM | POA: Diagnosis not present

## 2017-12-30 DIAGNOSIS — R42 Dizziness and giddiness: Secondary | ICD-10-CM | POA: Diagnosis not present

## 2017-12-30 DIAGNOSIS — R296 Repeated falls: Secondary | ICD-10-CM | POA: Diagnosis not present

## 2017-12-30 DIAGNOSIS — M62562 Muscle wasting and atrophy, not elsewhere classified, left lower leg: Secondary | ICD-10-CM | POA: Diagnosis not present

## 2017-12-30 DIAGNOSIS — M62561 Muscle wasting and atrophy, not elsewhere classified, right lower leg: Secondary | ICD-10-CM | POA: Diagnosis not present

## 2017-12-30 DIAGNOSIS — I10 Essential (primary) hypertension: Secondary | ICD-10-CM | POA: Diagnosis not present

## 2017-12-30 DIAGNOSIS — R278 Other lack of coordination: Secondary | ICD-10-CM | POA: Diagnosis not present

## 2017-12-30 DIAGNOSIS — F0280 Dementia in other diseases classified elsewhere without behavioral disturbance: Secondary | ICD-10-CM | POA: Diagnosis not present

## 2017-12-31 DIAGNOSIS — R258 Other abnormal involuntary movements: Secondary | ICD-10-CM | POA: Diagnosis not present

## 2017-12-31 DIAGNOSIS — M79605 Pain in left leg: Secondary | ICD-10-CM | POA: Diagnosis not present

## 2017-12-31 DIAGNOSIS — M6389 Disorders of muscle in diseases classified elsewhere, multiple sites: Secondary | ICD-10-CM | POA: Diagnosis not present

## 2017-12-31 DIAGNOSIS — M79604 Pain in right leg: Secondary | ICD-10-CM | POA: Diagnosis not present

## 2017-12-31 DIAGNOSIS — M62561 Muscle wasting and atrophy, not elsewhere classified, right lower leg: Secondary | ICD-10-CM | POA: Diagnosis not present

## 2017-12-31 DIAGNOSIS — M62562 Muscle wasting and atrophy, not elsewhere classified, left lower leg: Secondary | ICD-10-CM | POA: Diagnosis not present

## 2018-01-01 DIAGNOSIS — M79605 Pain in left leg: Secondary | ICD-10-CM | POA: Diagnosis not present

## 2018-01-01 DIAGNOSIS — R258 Other abnormal involuntary movements: Secondary | ICD-10-CM | POA: Diagnosis not present

## 2018-01-01 DIAGNOSIS — M62561 Muscle wasting and atrophy, not elsewhere classified, right lower leg: Secondary | ICD-10-CM | POA: Diagnosis not present

## 2018-01-01 DIAGNOSIS — M62562 Muscle wasting and atrophy, not elsewhere classified, left lower leg: Secondary | ICD-10-CM | POA: Diagnosis not present

## 2018-01-01 DIAGNOSIS — M79604 Pain in right leg: Secondary | ICD-10-CM | POA: Diagnosis not present

## 2018-01-01 DIAGNOSIS — M6389 Disorders of muscle in diseases classified elsewhere, multiple sites: Secondary | ICD-10-CM | POA: Diagnosis not present

## 2018-01-02 DIAGNOSIS — R258 Other abnormal involuntary movements: Secondary | ICD-10-CM | POA: Diagnosis not present

## 2018-01-02 DIAGNOSIS — R350 Frequency of micturition: Secondary | ICD-10-CM | POA: Diagnosis not present

## 2018-01-02 DIAGNOSIS — M62562 Muscle wasting and atrophy, not elsewhere classified, left lower leg: Secondary | ICD-10-CM | POA: Diagnosis not present

## 2018-01-02 DIAGNOSIS — M79605 Pain in left leg: Secondary | ICD-10-CM | POA: Diagnosis not present

## 2018-01-02 DIAGNOSIS — M62561 Muscle wasting and atrophy, not elsewhere classified, right lower leg: Secondary | ICD-10-CM | POA: Diagnosis not present

## 2018-01-02 DIAGNOSIS — N3941 Urge incontinence: Secondary | ICD-10-CM | POA: Diagnosis not present

## 2018-01-02 DIAGNOSIS — M6389 Disorders of muscle in diseases classified elsewhere, multiple sites: Secondary | ICD-10-CM | POA: Diagnosis not present

## 2018-01-02 DIAGNOSIS — M79604 Pain in right leg: Secondary | ICD-10-CM | POA: Diagnosis not present

## 2018-01-03 DIAGNOSIS — M79605 Pain in left leg: Secondary | ICD-10-CM | POA: Diagnosis not present

## 2018-01-03 DIAGNOSIS — M62561 Muscle wasting and atrophy, not elsewhere classified, right lower leg: Secondary | ICD-10-CM | POA: Diagnosis not present

## 2018-01-03 DIAGNOSIS — M79604 Pain in right leg: Secondary | ICD-10-CM | POA: Diagnosis not present

## 2018-01-03 DIAGNOSIS — M6389 Disorders of muscle in diseases classified elsewhere, multiple sites: Secondary | ICD-10-CM | POA: Diagnosis not present

## 2018-01-03 DIAGNOSIS — M62562 Muscle wasting and atrophy, not elsewhere classified, left lower leg: Secondary | ICD-10-CM | POA: Diagnosis not present

## 2018-01-03 DIAGNOSIS — R258 Other abnormal involuntary movements: Secondary | ICD-10-CM | POA: Diagnosis not present

## 2018-01-06 DIAGNOSIS — M62562 Muscle wasting and atrophy, not elsewhere classified, left lower leg: Secondary | ICD-10-CM | POA: Diagnosis not present

## 2018-01-06 DIAGNOSIS — M79605 Pain in left leg: Secondary | ICD-10-CM | POA: Diagnosis not present

## 2018-01-06 DIAGNOSIS — M79604 Pain in right leg: Secondary | ICD-10-CM | POA: Diagnosis not present

## 2018-01-06 DIAGNOSIS — M6389 Disorders of muscle in diseases classified elsewhere, multiple sites: Secondary | ICD-10-CM | POA: Diagnosis not present

## 2018-01-06 DIAGNOSIS — M62561 Muscle wasting and atrophy, not elsewhere classified, right lower leg: Secondary | ICD-10-CM | POA: Diagnosis not present

## 2018-01-06 DIAGNOSIS — R258 Other abnormal involuntary movements: Secondary | ICD-10-CM | POA: Diagnosis not present

## 2018-01-07 ENCOUNTER — Encounter: Payer: Self-pay | Admitting: Neurology

## 2018-01-07 ENCOUNTER — Other Ambulatory Visit: Payer: Self-pay

## 2018-01-07 ENCOUNTER — Ambulatory Visit (INDEPENDENT_AMBULATORY_CARE_PROVIDER_SITE_OTHER): Payer: Medicare Other | Admitting: Neurology

## 2018-01-07 VITALS — BP 134/76 | HR 72 | Ht 67.0 in | Wt 150.0 lb

## 2018-01-07 DIAGNOSIS — I252 Old myocardial infarction: Secondary | ICD-10-CM | POA: Diagnosis not present

## 2018-01-07 DIAGNOSIS — G232 Striatonigral degeneration: Secondary | ICD-10-CM | POA: Diagnosis not present

## 2018-01-07 DIAGNOSIS — I2510 Atherosclerotic heart disease of native coronary artery without angina pectoris: Secondary | ICD-10-CM

## 2018-01-07 DIAGNOSIS — G20 Parkinson's disease: Secondary | ICD-10-CM

## 2018-01-07 MED ORDER — CARBIDOPA-LEVODOPA 25-100 MG PO TABS: ORAL_TABLET | ORAL | 6 refills | Status: DC

## 2018-01-07 NOTE — Patient Instructions (Signed)
1. Do a trial of Sinemet 25/100mg : Take 1/2 tablet three times a day with meals for 1 week, then increase to 1 tablet three times a day with meals. If no side effects, we may plan to increase dose further  2. Continue with physical therapy  3. Continue Donepezil 5mg  daily  4. Follow-up in 3 months, call for any changes

## 2018-01-07 NOTE — Progress Notes (Signed)
NEUROLOGY CONSULTATION NOTE  Kaitlyn Ortiz MRN: [id number] DOB: 04-Jul-1931  Referring provider: Dr. Hollace Kinnier Primary care provider: Dr. Hollace Kinnier  Reason for consult:  dementia  Dear Dr [name]:  Thank you for your kind referral of Kaitlyn Ortiz for consultation of the above symptoms. Although her history is well known to you, please allow me to reiterate it for the purpose of our medical record. The patient was accompanied to the clinic by her daughter who also provides collateral information. Records and images were personally reviewed where available.  HISTORY OF PRESENT ILLNESS: This is an 84 year old right-handed woman with a history of hypertension, dementia, presenting for evaluation of concern for parkinsonism. According to 10/30/17 note, she gets lost in the facility easily, she was noted to have decreased coordination with toe taps on left with slowing versus right, fast tremor of left leg at rest, seems to be able to stop on her own, bilateral UE intention tremor and slight resting tremor left greater than right, does not have classic shuffling gait, ambulates with rollator walker.   She reports her memory is fine. Her daughter reports that she had been living alone in [place], [place] until April 2018 when she had an episode of delirium while traveling, wandering around the hotel in the middle of the night. She was found to have a UTI. She would occasionally mention driving to the supermarket and forgetting where to go. She was evaluated at [facility] in 08/2016, [facility] 23/30, with a diagnosis of Mild Cognitive Impairment. She moved to [facility] in July 2018. She was noted to have spatial difficulties finding her table or the kitchen sink. At that time she was noted to have tremor and slightly masked facies. It was noted she met criteria for assisted living, but did not move until June 2019. She was having more balance issues and falls, and with  a fall in June 2019, she was noted to be orthostatic, with improvement on holding BP medications. She has been seeing Urology for overactive bladder and has had a bladder stimulator which she feels helps a little. Medications are administered by staff. She now needs help with bathing and dressing, figuring out where the top is or legs of pants are. Her daughter manages finances. Her daughter notes that it is a big problem navigating her environment. She would sit and be unable to figure out how to do it, difficulty understanding how to get from standing to sitting on the toilet. She is taking Donepezil 64m daily without side effects.  She reports dizziness when supine, but mostly when she gets up for dinner. She feels dizziness lasts for several minutes, she would grap hold of something that would stop her from falling. No neck/back pain, focal numbness/tingling/weakness. She wears a brace on both knees. She has chronic constipation and reports that she would go once very 8-10 months, but now every 7-8 days. She reports blurred vision. No dysarthria/dysphagia. She has had decreased sense of smell for 10-15 years. She sleeps fine the first 5 hours, then wakes at midnight with light sleep, but feels refreshed in the morning. Her daughter notes she tends to nod off. She reports a left foot tremor that she can control when she thinks about it. She feels it moves when she crosses her leg, but is noted to have a resting tremor today. She has noticed her fingers tremble a little for the past year, eating and handwriting is messy over the  past 3-5 years. It takes her more than 24 hours to write a card.   I personally reviewed MRI brain without contrast done 11/15/17 which did not show any acute changes, there was moderate diffuse volume loss, mild to moderate chronic microvascular disease.  Laboratory Data: Lab Results  Component Value Date   TSH 2.89 01/01/2017   Lab Results  Component Value Date   VITAMINB12  550 01/01/2017     PAST MEDICAL HISTORY: Past Medical History:  Diagnosis Date  . Bronchiectasis ([place])   . Cognitive changes    mild  . Glaucoma   . Heart attack ([place])    mild  . Hypertension   . Incontinence     PAST SURGICAL HISTORY: Past Surgical History:  Procedure Laterality Date  . CATARACT EXTRACTION  1994  . CATARACT EXTRACTION  2006    MEDICATIONS: Current Outpatient Medications on File Prior to Visit  Medication Sig Dispense Refill  . acetaminophen (TYLENOL) 325 MG tablet Take 650 mg by mouth every 4 (four) hours as needed.    Marland Kitchen amLODipine (NORVASC) 5 MG tablet Take 1 tablet (5 mg total) by mouth daily. 30 tablet 0  . Ascorbic Acid (VITAMIN C) 1000 MG tablet Take 1,000 mg by mouth daily.    Marland Kitchen aspirin 81 MG chewable tablet Chew 1 tablet by mouth daily.    . Cholecalciferol (VITAMIN D3) 5000 units CAPS Take 1 capsule (5,000 Units total) by mouth daily. 30 capsule 5  . donepezil (ARICEPT) 5 MG tablet Take 1 tablet (5 mg total) by mouth at bedtime. 90 tablet 1  . Glucosamine-Chondroit-Vit C-Mn (GLUCOSAMINE 1500 COMPLEX PO) Take 1-2 tablets by mouth daily.    Marland Kitchen latanoprost (XALATAN) 0.005 % ophthalmic solution Place 1 drop into both eyes at bedtime.    . Lutein 6 MG CAPS Take 1 capsule by mouth daily.    . Multiple Vitamin (MULTIVITAMIN) tablet Take 1 tablet by mouth daily.    . polyethylene glycol (MIRALAX / GLYCOLAX) packet Take 17 g by mouth daily as needed.    . pravastatin (PRAVACHOL) 40 MG tablet TAKE 1 TABLET ONCE DAILY. 90 tablet 0  . senna-docusate (SENEXON-S) 8.6-50 MG tablet Take 1 tablet by mouth daily.     No current facility-administered medications on file prior to visit.     ALLERGIES: Allergies  Allergen Reactions  . Caffeine     FAMILY HISTORY: Family History  Problem Relation Age of Onset  . Stroke Father     SOCIAL HISTORY: Social History   Socioeconomic History  . Marital status: Single    Spouse name: Not on file  . Number of  children: Not on file  . Years of education: Not on file  . Highest education level: Not on file  Occupational History  . Occupation: publishing  Social Needs  . Financial resource strain: Not hard at all  . Food insecurity:    Worry: Never true    Inability: Never true  . Transportation needs:    Medical: No    Non-medical: No  Tobacco Use  . Smoking status: Never Smoker  . Smokeless tobacco: Never Used  Substance and Sexual Activity  . Alcohol use: No  . Drug use: No  . Sexual activity: Not Currently  Lifestyle  . Physical activity:    Days per week: 0 days    Minutes per session: 0 min  . Stress: Only a little  Relationships  . Social connections:    Talks on phone: Twice a  week    Gets together: Twice a week    Attends religious service: Never    Active member of club or organization: No    Attends meetings of clubs or organizations: Never    Relationship status: Never married  . Intimate partner violence:    Fear of current or ex partner: No    Emotionally abused: No    Physically abused: No    Forced sexual activity: No  Other Topics Concern  . Not on file  Social History Narrative   Social History      Diet? Lots of fruits and veggies      Do you drink/eat things with caffeine? no      Marital status?                            single        What year were you married? 1960      Do you live in a house, apartment, assisted living, condo, trailer, etc.? apartment      Is it one or more stories? one      How many persons live in your home? one      Do you have any pets in your home? (please list) no      Highest level of education completed? masters      Current or past profession: publishing      Advanced Directives      Do you exercise?           yes                           Type & how often? Walking- daily      Do you have a living will? yes      Do you have a DNR form?                                  If not, do you want to discuss one? no        Do you have signed POA/HPOA for forms? yes      Functional Status      Do you have difficulty bathing or dressing yourself? no      Do you have difficulty preparing food or eating?  no      Do you have difficulty managing your medications? no      Do you have difficulty managing your finances? no      Do you have difficulty affording your medications? no    REVIEW OF SYSTEMS: Constitutional: No fevers, chills, or sweats, no generalized fatigue, change in appetite Eyes: No visual changes, double vision, eye pain Ear, nose and throat: No hearing loss, ear pain, nasal congestion, sore throat Cardiovascular: No chest pain, palpitations Respiratory:  No shortness of breath at rest or with exertion, wheezes GastrointestinaI: No nausea, vomiting, diarrhea, abdominal pain, fecal incontinence Genitourinary:  No dysuria, urinary retention or frequency Musculoskeletal:  No neck pain, back pain Integumentary: No rash, pruritus, skin lesions Neurological: as above Psychiatric: No depression, insomnia, anxiety Endocrine: No palpitations, fatigue, diaphoresis, mood swings, change in appetite, change in weight, increased thirst Hematologic/Lymphatic:  No anemia, purpura, petechiae. Allergic/Immunologic: no itchy/runny eyes, nasal congestion, recent allergic reactions, rashes  PHYSICAL EXAM: Vitals:   01/07/18 1052  BP: 134/76  Pulse: 72  SpO2: 98%  General: No acute distress, hypomimia Head:  Normocephalic/atraumatic Eyes: Fundoscopic exam shows bilateral sharp discs, no vessel changes, exudates, or hemorrhages Neck: supple, no paraspinal tenderness, full range of motion Back: No paraspinal tenderness Heart: regular rate and rhythm Lungs: Clear to auscultation bilaterally. Vascular: No carotid bruits. Skin/Extremities: No rash, no edema Neurological Exam: Mental status: alert and oriented to person, place, and time, no dysarthria or aphasia, Fund of knowledge is appropriate.   Recent and remote memory are intact.  Attention and concentration are normal.    Able to name objects and repeat phrases. CDT 4/5 MMSE - Mini Mental State Exam 01/07/2018 07/16/2017  Orientation to time 5 4  Orientation to Place 5 5  Registration 3 3  Attention/ Calculation 5 5  Recall 3 2  Language- name 2 objects 2 2  Language- repeat 1 1  Language- follow 3 step command 3 3  Language- read & follow direction 1 1  Write a sentence 1 1  Copy design 0 0  Total score 29 27   Cranial nerves: CN I: not tested CN II: pupils equal, round and reactive to light, visual fields intact, fundi unremarkable. CN III, IV, VI:  full range of motion with ?mild upgaze restriction, no nystagmus, no ptosis CN V: facial sensation intact CN VII: upper and lower face symmetric CN VIII: hearing intact to finger rub CN IX, X: gag intact, uvula midline CN XI: sternocleidomastoid and trapezius muscles intact CN XII: tongue midline Bulk & Tone: mild bilateral cogwheeling with distraction, no fasciculations. Motor: 5/5 throughout with no pronator drift. Sensation: intact to light touch, cold, pin, vibration and joint position sense.  No extinction to double simultaneous stimulation.  Romberg test negative Deep Tendon Reflexes: +2 throughout, no ankle clonus Plantar responses: downgoing bilaterally Cerebellar: no incoordination on finger to nose, heel to shin. No dysdiadochokinesia Gait: she felt dizzy upon standing, gait narrow-based without walker, no shuffling gait noted, decreased arm swing Tremor: left foot resting tremor, resting tremor noted on right 5th digit Decreased left foot tapping, decreased finger taps bilaterally +pull test  IMPRESSION: This is an 84 year old right-handed woman with a history of hypertension, dementia, presenting for evaluation of concern for parkinsonism. She started having noticeable cognitive changes in 2018, at that time she was noted to have tremor and masked facies, her  daughter reports progressive difficulty with spatial orientation. Her MMSE today is 29/30, exam shows left foot resting tremor, mild cogwheeling, reduced finger and left foot taps. She also has a history of orthostatic hypotension, constipation, urinary incontinence. This constellation of symptoms raises concern for Multiple System Atrophy (MSA-P) versus Parkinson's disease. We discussed differential diagnosis, and doing a trial of Sinemet. We discussed how the main role of levodopa in patients with suspected MSA is diagnostic - a poor or unsustained response to levodopa therapy is generally observed in patients with MSA and can help to distinguish the parkinsonian variant of MSA (MSA-P) from idiopathic Parkinson disease. Total daily dose is increased to 102m daily over a three month period before trial is declared a failure. Start low dose 25/1072m1/2 tab TID with meals, increase in 1 week to 1 tab TID. Side effects discussed. We will uptitrate as tolerated. Continue with physical therapy, continue Donepezil. She will follow-up in 3 months and knows to call for any changes.   Thank you for allowing me to participate in the care of this patient. Please do not hesitate to call for any questions or concerns.   KaSantiago Glad  Delice Lesch, M.D.  CC: Dr. [name]

## 2018-01-08 DIAGNOSIS — M62561 Muscle wasting and atrophy, not elsewhere classified, right lower leg: Secondary | ICD-10-CM | POA: Diagnosis not present

## 2018-01-08 DIAGNOSIS — M62562 Muscle wasting and atrophy, not elsewhere classified, left lower leg: Secondary | ICD-10-CM | POA: Diagnosis not present

## 2018-01-08 DIAGNOSIS — M6389 Disorders of muscle in diseases classified elsewhere, multiple sites: Secondary | ICD-10-CM | POA: Diagnosis not present

## 2018-01-08 DIAGNOSIS — R258 Other abnormal involuntary movements: Secondary | ICD-10-CM | POA: Diagnosis not present

## 2018-01-08 DIAGNOSIS — M79605 Pain in left leg: Secondary | ICD-10-CM | POA: Diagnosis not present

## 2018-01-08 DIAGNOSIS — M79604 Pain in right leg: Secondary | ICD-10-CM | POA: Diagnosis not present

## 2018-01-09 DIAGNOSIS — N3941 Urge incontinence: Secondary | ICD-10-CM | POA: Diagnosis not present

## 2018-01-15 ENCOUNTER — Encounter: Payer: Self-pay | Admitting: Neurology

## 2018-01-16 DIAGNOSIS — N3941 Urge incontinence: Secondary | ICD-10-CM | POA: Diagnosis not present

## 2018-01-16 DIAGNOSIS — R350 Frequency of micturition: Secondary | ICD-10-CM | POA: Diagnosis not present

## 2018-01-21 DIAGNOSIS — L602 Onychogryphosis: Secondary | ICD-10-CM | POA: Diagnosis not present

## 2018-01-21 DIAGNOSIS — L603 Nail dystrophy: Secondary | ICD-10-CM | POA: Diagnosis not present

## 2018-01-23 DIAGNOSIS — N3941 Urge incontinence: Secondary | ICD-10-CM | POA: Diagnosis not present

## 2018-01-23 DIAGNOSIS — R350 Frequency of micturition: Secondary | ICD-10-CM | POA: Diagnosis not present

## 2018-01-28 ENCOUNTER — Other Ambulatory Visit: Payer: Self-pay | Admitting: *Deleted

## 2018-01-28 DIAGNOSIS — I712 Thoracic aortic aneurysm, without rupture, unspecified: Secondary | ICD-10-CM

## 2018-01-30 DIAGNOSIS — R350 Frequency of micturition: Secondary | ICD-10-CM | POA: Diagnosis not present

## 2018-01-30 DIAGNOSIS — N3941 Urge incontinence: Secondary | ICD-10-CM | POA: Diagnosis not present

## 2018-02-06 DIAGNOSIS — R350 Frequency of micturition: Secondary | ICD-10-CM | POA: Diagnosis not present

## 2018-02-06 DIAGNOSIS — N3941 Urge incontinence: Secondary | ICD-10-CM | POA: Diagnosis not present

## 2018-02-13 DIAGNOSIS — R350 Frequency of micturition: Secondary | ICD-10-CM | POA: Diagnosis not present

## 2018-02-13 DIAGNOSIS — N3941 Urge incontinence: Secondary | ICD-10-CM | POA: Diagnosis not present

## 2018-02-20 DIAGNOSIS — N3946 Mixed incontinence: Secondary | ICD-10-CM | POA: Diagnosis not present

## 2018-02-26 ENCOUNTER — Non-Acute Institutional Stay: Payer: Medicare Other | Admitting: Internal Medicine

## 2018-02-26 ENCOUNTER — Encounter: Payer: Self-pay | Admitting: Internal Medicine

## 2018-02-26 VITALS — BP 110/70 | HR 67 | Temp 98.4°F | Ht 67.0 in | Wt 154.0 lb

## 2018-02-26 DIAGNOSIS — G20 Parkinson's disease: Secondary | ICD-10-CM

## 2018-02-26 DIAGNOSIS — H409 Unspecified glaucoma: Secondary | ICD-10-CM | POA: Diagnosis not present

## 2018-02-26 DIAGNOSIS — I2510 Atherosclerotic heart disease of native coronary artery without angina pectoris: Secondary | ICD-10-CM | POA: Diagnosis not present

## 2018-02-26 DIAGNOSIS — I712 Thoracic aortic aneurysm, without rupture, unspecified: Secondary | ICD-10-CM

## 2018-02-26 DIAGNOSIS — I252 Old myocardial infarction: Secondary | ICD-10-CM

## 2018-02-26 DIAGNOSIS — J479 Bronchiectasis, uncomplicated: Secondary | ICD-10-CM

## 2018-02-26 DIAGNOSIS — M8580 Other specified disorders of bone density and structure, unspecified site: Secondary | ICD-10-CM | POA: Insufficient documentation

## 2018-02-26 DIAGNOSIS — K5909 Other constipation: Secondary | ICD-10-CM | POA: Diagnosis not present

## 2018-02-26 DIAGNOSIS — N3281 Overactive bladder: Secondary | ICD-10-CM

## 2018-02-26 DIAGNOSIS — G20C Parkinsonism, unspecified: Secondary | ICD-10-CM

## 2018-02-26 NOTE — Progress Notes (Signed)
Location:  Occupational psychologist of Service:  Clinic (12)  Provider: Juanya Villavicencio L. [name], D.O., C.M.D.  Code Status: DNR, MOST Goals of Care:  Advanced Directives 11/19/2017  Does Patient Have a Medical Advance Directive? Yes  Type of Paramedic of [place];Living will;Out of facility DNR (pink MOST or yellow form)  Does patient want to make changes to medical advance directive? No - Patient declined  Copy of [place] in Chart? Yes  Pre-existing out of facility DNR order (yellow form or pink MOST form) Yellow form placed in chart (order not valid for inpatient use)     Chief Complaint  Patient presents with  . Follow-up    mobility    HPI: Patient is a 84 y.o. female seen today for medical management of chronic diseases.    She is here to f/u on her mobility.  She does walk a lot.   No recent falls.    Thought she was getting a cold but it didn't come out.  [place] Mam is with Mardene Celeste on 2 hrs three to four days a week.  They have a smooth morning routine going now.  Pt is thinking of dispensing her evening routine person--says she can do everything except putting on the pads and her depend briefs.  Discussed infection risk if she's unable to change these on her own.  Says she uses a cream that is for babies.  She is on her preventive abx for UTI.  Pt was started on sinemet and dose adjusted by Dr. Delice Lesch due to suspected multisystem atrophy.  She's also been on her aricept and PT.  She graduated from PT for walking and then was getting balance therapy.  Does exercises just about daily.  Also will go to some of the simple exercise class that is three times a week.    Attends the writer's group.  Sometimes goes to the [place] activities that she can watch rather than actively participate in.   Pt's daughter, [name], has appropriately added her mom to the waiting list for a skilled bed here.  Pt was upset about this,  but she is aware that if she truly has MSA, she is likely to lose her ability to ambulate so she would require a skilled level of care.  She finished her 12 weeks of tracking her urinary incontinence.  She did not improve so Dr. Matilde Sprang put her on maintenance for 3 weeks and then is reassessing.  May be that it just does not work for her and she has to use the pads and depends.    She stays dizzy almost all of the time.  BP well controlled.   Says there are times that she just sits down.   She sees Dr. Delice Lesch on 12/20.  She's less tremulous.  She's not sure if her walking is better.  BP running low much of the time; however, some run in the 130s to 150s.    She has her f/u CT on her aneurysm next month.    Constipation--bowels will be almost normal for 2-3 days at a time, then may get backed up.   She continues to forget words, says she gets all mixed up on processes like following recipes or directions on the computer.  Does not struggle to remember who she has dinner with on Thursday, for example.    She is concerned she is bothering people.  I have reassured her that this is not  the case.  She felt like she was demanding things from staff.  I explained that we are all here to help her as she needs more assistance and no one is bothered by her needs/requests.  She has now had the second of her shingrix vaccines.    Says she's not had her flu shot just yet.    Therapy has recommended some braces for her knee pain, but she's not been needing these for the past two days and her knees do actually feel better.   Past Medical History:  Diagnosis Date  . Bronchiectasis ([place])   . Cognitive changes    mild  . Glaucoma   . Heart attack ([place])    mild  . Hypertension   . Incontinence     Past Surgical History:  Procedure Laterality Date  . CATARACT EXTRACTION  1994  . CATARACT EXTRACTION  2006    Allergies  Allergen Reactions  . Caffeine     Outpatient Encounter Medications as of  02/26/2018  Medication Sig  . acetaminophen (TYLENOL) 325 MG tablet Take 650 mg by mouth every 4 (four) hours as needed.  Marland Kitchen amLODipine (NORVASC) 5 MG tablet Take 1 tablet (5 mg total) by mouth daily.  . Ascorbic Acid (VITAMIN C) 1000 MG tablet Take 1,000 mg by mouth daily.  Marland Kitchen aspirin 81 MG chewable tablet Chew 1 tablet by mouth daily.  . carbidopa-levodopa (SINEMET) 25-100 MG tablet Take 1/2 tablet three times a day with meals for 1 week, then increase to 1 tablet three times a day with meals  . Cholecalciferol (VITAMIN D3) 5000 units CAPS Take 1 capsule (5,000 Units total) by mouth daily.  Marland Kitchen donepezil (ARICEPT) 5 MG tablet Take 1 tablet (5 mg total) by mouth at bedtime.  . Glucosamine-Chondroit-Vit C-Mn (GLUCOSAMINE 1500 COMPLEX PO) Take 1-2 tablets by mouth daily.  Marland Kitchen latanoprost (XALATAN) 0.005 % ophthalmic solution Place 1 drop into both eyes at bedtime.  . Lutein 6 MG CAPS Take 1 capsule by mouth daily.  . Multiple Vitamin (MULTIVITAMIN) tablet Take 1 tablet by mouth daily.  . polyethylene glycol (MIRALAX / GLYCOLAX) packet Take 17 g by mouth daily as needed.  . pravastatin (PRAVACHOL) 40 MG tablet TAKE 1 TABLET ONCE DAILY.  Marland Kitchen senna-docusate (SENEXON-S) 8.6-50 MG tablet Take 1 tablet by mouth daily.   No facility-administered encounter medications on file as of 02/26/2018.     Review of Systems:  Review of Systems  Constitutional: Positive for malaise/fatigue. Negative for chills and fever.  HENT: Negative for congestion.   Eyes: Positive for blurred vision.  Respiratory: Negative for shortness of breath.   Cardiovascular: Negative for chest pain, palpitations and leg swelling.  Gastrointestinal: Positive for constipation. Negative for abdominal pain, blood in stool, diarrhea and melena.  Genitourinary: Positive for frequency and urgency. Negative for dysuria.       Incontinence--pt does not note any improvement with PTNS  Musculoskeletal: Positive for falls and joint pain.        Right knee pain  Skin: Negative for itching and rash.  Neurological: Positive for dizziness and tremors. Negative for seizures and loss of consciousness.  Psychiatric/Behavioral: Positive for memory loss. Negative for depression. The patient is not nervous/anxious and does not have insomnia.     Health Maintenance  Topic Date Due  . INFLUENZA VACCINE  11/28/2017  . TETANUS/TDAP  10/02/2027  . DEXA SCAN  Completed  . PNA vac Low Risk Adult  Completed    Physical Exam: Vitals:  02/26/18 0828  BP: 110/70  Pulse: 67  Temp: 98.4 F (36.9 C)  TempSrc: Oral  SpO2: 94%  Weight: 154 lb (69.9 kg)  Height: 5\' 7"  (1.702 m)   Body mass index is 24.12 kg/m. Physical Exam  Constitutional: She appears well-developed. No distress.  HENT:  Head: Normocephalic and atraumatic.  Eyes:  glasses  Cardiovascular: Normal rate, regular rhythm, normal heart sounds and intact distal pulses.  Pulmonary/Chest: Effort normal and breath sounds normal. She has no wheezes. She has no rales.  Abdominal: Soft. Bowel sounds are normal. She exhibits no distension. There is no tenderness.  Musculoskeletal:  Shuffling gait, not having stiffness of left leg today, wearing brace on right knee  Neurological: She is alert.  Oriented to person, place, some memory loss, but more functional issues and physical decline  Skin: Skin is warm and dry. Capillary refill takes less than 2 seconds. There is pallor.  Psychiatric:  Somewhat flat affect, but does open up over visit    Labs reviewed: Basic Metabolic Panel: Recent Labs    11/14/17 2357 11/15/17 0448 11/17/17 0401  NA 140 140 139  K 4.2 3.9 3.8  CL 106 106 105  CO2 25 26 26   GLUCOSE 124* 109* 110*  BUN 24* 22 19  CREATININE 1.11* 0.90 0.87  CALCIUM 9.3 8.9 8.9  MG  --   --  2.0   Liver Function Tests: Recent Labs    11/14/17 2357  AST 32  ALT 24  ALKPHOS 87  BILITOT 0.9  PROT 7.0  ALBUMIN 4.0   No results for input(s): LIPASE, AMYLASE  in the last 8760 hours. No results for input(s): AMMONIA in the last 8760 hours. CBC: Recent Labs    11/14/17 2357 11/15/17 0448 11/17/17 0401  WBC 8.3 6.3 6.1  NEUTROABS 6.8  --   --   HGB 11.9* 11.7* 12.8  HCT 35.3* 35.4* 38.1  MCV 91.5 91.0 92.3  PLT 194 207 199    Assessment/Plan 1. Parkinsonism, unspecified Parkinsonism type ([place]) -suspected multisystem atrophy when seen by Dr. Delice Lesch, gait seems to be considerably improved with sinemet on board with less stiffness of left leg and fewer falls -cont rollator walker use, standing slowly and staying still before moving, good hydration to decrease risk of orthostasis from autonomic insufficiency -agree with daughter's plan to get her on SNF list so she will have a place to go when needed--is already borderline on bad days  2. Thoracic aortic aneurysm without rupture ([place]) -keep planned f/u CT and cardiology f/u  3. Coronary artery disease with history of myocardial infarction without history of CABG -cont asa, bp control, lipid control and f/u cardiology   4. Chronic constipation -goes every 2-3 days by her report, encouraged hydration, fiber, and continues on prn miralax, daily senna-s  5. Overactive bladder -cont has been getting PTNS with Dr. Matilde Sprang; had been on prophylactic abx to prevent UTIs, but I don't see this on her list now  6. Glaucoma, unspecified glaucoma type, unspecified laterality -continues on lutein, xalatan drops per ophtho  7. Bronchiectasis without complication (H[facility]) -no recent exacerbations or infections, preventive vaccines given, pt refuses to chronically use nebs despite pulmonary recommendations  8. Osteopenia, unspecified location -Bone density at the [facility] for osteopenia -cont Vitamin D 5000 units daily   Labs/tests ordered:  Bone density Next appt: F/u with me 06/25/18  Prentice Sackrider L. Lavanda Nevels, D.O. [facility] [address] [place], [place]  [address] Cell Phone (Mon-Fri 8am-5pm):  [phone] On Call:  [phone] & follow prompts after 5pm & weekends Office Phone:  [phone] Office Fax:  [phone]

## 2018-02-27 ENCOUNTER — Other Ambulatory Visit: Payer: Self-pay | Admitting: Internal Medicine

## 2018-02-27 DIAGNOSIS — Z23 Encounter for immunization: Secondary | ICD-10-CM | POA: Diagnosis not present

## 2018-02-27 DIAGNOSIS — M8580 Other specified disorders of bone density and structure, unspecified site: Secondary | ICD-10-CM

## 2018-03-03 ENCOUNTER — Telehealth: Payer: Self-pay | Admitting: *Deleted

## 2018-03-03 NOTE — Telephone Encounter (Signed)
Spoke with daughter and advised results.  

## 2018-03-03 NOTE — Telephone Encounter (Signed)
Patient called and stated that she just saw you at [facility] and forgot to ask for a referral for Hearing Aids. Patient stated that she would like your advise. Please Advise.

## 2018-03-03 NOTE — Telephone Encounter (Signed)
I recommend she have an evaluation at [facility] may then need further testing at the audiology main office.

## 2018-03-13 DIAGNOSIS — R350 Frequency of micturition: Secondary | ICD-10-CM | POA: Diagnosis not present

## 2018-03-13 DIAGNOSIS — N3941 Urge incontinence: Secondary | ICD-10-CM | POA: Diagnosis not present

## 2018-03-18 ENCOUNTER — Ambulatory Visit
Admission: RE | Admit: 2018-03-18 | Discharge: 2018-03-18 | Disposition: A | Discharge status: Home / Self Care | Payer: Medicare Other | Source: Ambulatory Visit | Attending: Thoracic Surgery (Cardiothoracic Vascular Surgery) | Admitting: Thoracic Surgery (Cardiothoracic Vascular Surgery)

## 2018-03-18 ENCOUNTER — Ambulatory Visit (INDEPENDENT_AMBULATORY_CARE_PROVIDER_SITE_OTHER): Payer: Medicare Other | Admitting: Thoracic Surgery (Cardiothoracic Vascular Surgery)

## 2018-03-18 VITALS — BP 130/80 | HR 64 | Resp 20 | Ht 67.0 in | Wt 157.0 lb

## 2018-03-18 DIAGNOSIS — I712 Thoracic aortic aneurysm, without rupture, unspecified: Secondary | ICD-10-CM

## 2018-03-18 DIAGNOSIS — I2510 Atherosclerotic heart disease of native coronary artery without angina pectoris: Secondary | ICD-10-CM | POA: Diagnosis not present

## 2018-03-18 DIAGNOSIS — I252 Old myocardial infarction: Secondary | ICD-10-CM

## 2018-03-18 DIAGNOSIS — R918 Other nonspecific abnormal finding of lung field: Secondary | ICD-10-CM

## 2018-03-18 NOTE — Progress Notes (Signed)
[address]       [place] [address]             [phone]    HPI: Ms. Harral returns for a scheduled follow-up visit  Sophie Tamez is an 84 year old woman with a past history of bronchiectasis, coronary artery disease, heart attack, hypertension, glaucoma, incontinence, ascending aneurysm, and early dementia.  She is at [facility]  She was found to have an ascending aneurysm last year on the CT of the chest.  CT of the chest in March she had a 4.5 cm ascending aneurysm.  There were multiple nodular densities in the lungs and bronchiectasis primarily in the middle lobe and lingula.  In the interim since her last visit she has not had any major issues.  She was admitted to the hospital in July with a fall.  She has gone through physical therapy for that.  She denies any chest pain, pressure, tightness.  She denies shortness of breath.   Past Medical History:  Diagnosis Date  . Bronchiectasis ([place])   . Cognitive changes    mild  . Glaucoma   . Heart attack ([place])    mild  . Hypertension   . Incontinence      Current Outpatient Medications  Medication Sig Dispense Refill  . acetaminophen (TYLENOL) 325 MG tablet Take 650 mg by mouth every 4 (four) hours as needed.    Marland Kitchen amLODipine (NORVASC) 5 MG tablet Take 1 tablet (5 mg total) by mouth daily. 30 tablet 0  . Ascorbic Acid (VITAMIN C) 1000 MG tablet Take 1,000 mg by mouth daily.    Marland Kitchen aspirin 81 MG chewable tablet Chew 1 tablet by mouth daily.    . carbidopa-levodopa (SINEMET) 25-100 MG tablet Take 1/2 tablet three times a day with meals for 1 week, then increase to 1 tablet three times a day with meals 90 tablet 6  . Cholecalciferol (VITAMIN D3) 5000 units CAPS Take 1 capsule (5,000 Units total) by mouth daily. 30 capsule 5  . donepezil (ARICEPT) 5 MG tablet Take 1 tablet (5 mg total) by mouth at bedtime. 90 tablet 1  . Glucosamine-Chondroit-Vit C-Mn (GLUCOSAMINE 1500 COMPLEX PO) Take 1-2 tablets by  mouth daily.    Marland Kitchen latanoprost (XALATAN) 0.005 % ophthalmic solution Place 1 drop into both eyes at bedtime.    . Lutein 6 MG CAPS Take 1 capsule by mouth daily.    . Multiple Vitamin (MULTIVITAMIN) tablet Take 1 tablet by mouth daily.    . Multiple Vitamins-Minerals (PRESERVISION AREDS 2+MULTI VIT) CAPS Take by mouth 2 (two) times daily.    . polyethylene glycol (MIRALAX / GLYCOLAX) packet Take 17 g by mouth daily as needed.    . pravastatin (PRAVACHOL) 40 MG tablet TAKE 1 TABLET ONCE DAILY. 90 tablet 0  . senna-docusate (SENEXON-S) 8.6-50 MG tablet Take 1 tablet by mouth daily.    . traMADol (ULTRAM) 50 MG tablet Take by mouth every 6 (six) hours as needed.     No current facility-administered medications for this visit.     Physical Exam BP 130/80   Pulse 64   Resp 20   Ht 5\' 7"  (1.702 m)   Wt 157 lb (71.2 kg)   BMI 24.51 kg/m  84 year old woman in no acute distress Oriented to person and place.  Shuffling gait. Lungs clear bilaterally no rales or wheezing Cardiac regular rate and rhythm normal S1 and S2  Diagnostic Tests: CT CHEST WITHOUT CONTRAST  TECHNIQUE: Multidetector CT imaging of the chest was performed following the standard protocol without IV contrast.  COMPARISON:  Chest CT 07/27/2017.  No other comparison studies.  FINDINGS: Cardiovascular: There is stable moderate coronary artery atherosclerosis with lesser atherosclerosis of the aorta and great vessels. There is diffuse ectasia of the thoracic aorta. The ascending aorta measures up to 4.5 cm in diameter (coronal image 84/4), unchanged. No evidence of displaced intimal calcification or mediastinal hematoma. The heart size is normal. There is no pericardial effusion.  Mediastinum/Nodes: There are no enlarged mediastinal, hilar or axillary lymph nodes. The thyroid gland, trachea and esophagus demonstrate no significant findings.  Lungs/Pleura: There is no pleural effusion or pneumothorax. There  is stable mild bronchiectasis, greatest in the right middle lobe and lingula where there is mild surrounding scarring. There is stable biapical scarring with mild nodular component on the right (image 18/8). No new, enlarging or suspicious pulmonary nodules.  Upper abdomen: Stable left renal cyst, small cyst medially in the right hepatic lobe and aortic atherosclerosis. No acute findings are seen within the visualized upper abdomen.  Musculoskeletal/Chest wall: There is no chest wall mass or suspicious osseous finding.  IMPRESSION: 1. Stable ascending aortic aneurysm, measuring up to 4.5 cm in diameter. 2. Stable bronchiectasis with scattered parenchymal scarring. No suspicious pulmonary nodule. The lung disease can be safely followed on routine follow-up imaging of the patient's thoracic aorta. 3. Coronary artery atherosclerosis. 4. No acute findings.   Electronically Signed   By: Richardean Sale M.D.   On: 03/18/2018 14:53 I personally reviewed the CT images and concur with the findings noted above.  Impression: [name] is an 84 year old woman with multiple medical problems and early dementia.  She was found to have a 4.5 cm ascending aorta earlier this year.  Her CT today shows the aorta is roughly stable in size.  About 4.5 cm in greatest diameter.  There is no indication for surgical intervention.  Hypertension-blood pressure reasonably well controlled at present  Bronchiectasis-stable  Dementia-likely would not be a surgical candidate  Plan: Return in 6 months with CT chest without contrast to follow-up ascending aneurysm and bronchiectasis/ lung nodules.  Melrose Nakayama, MD [facility] [phone]

## 2018-04-07 ENCOUNTER — Other Ambulatory Visit: Payer: Self-pay | Admitting: Internal Medicine

## 2018-04-07 ENCOUNTER — Other Ambulatory Visit: Payer: Self-pay

## 2018-04-07 DIAGNOSIS — M8580 Other specified disorders of bone density and structure, unspecified site: Secondary | ICD-10-CM

## 2018-04-08 ENCOUNTER — Encounter: Payer: Self-pay | Admitting: *Deleted

## 2018-04-08 ENCOUNTER — Ambulatory Visit
Admission: RE | Admit: 2018-04-08 | Discharge: 2018-04-08 | Disposition: A | Discharge status: Home / Self Care | Payer: Medicare Other | Source: Ambulatory Visit | Attending: Internal Medicine | Admitting: Internal Medicine

## 2018-04-08 ENCOUNTER — Other Ambulatory Visit: Payer: Medicare Other

## 2018-04-08 DIAGNOSIS — M810 Age-related osteoporosis without current pathological fracture: Secondary | ICD-10-CM | POA: Diagnosis not present

## 2018-04-08 DIAGNOSIS — Z780 Asymptomatic menopausal state: Secondary | ICD-10-CM | POA: Diagnosis not present

## 2018-04-08 DIAGNOSIS — M85852 Other specified disorders of bone density and structure, left thigh: Secondary | ICD-10-CM | POA: Diagnosis not present

## 2018-04-08 DIAGNOSIS — M8580 Other specified disorders of bone density and structure, unspecified site: Secondary | ICD-10-CM

## 2018-04-10 DIAGNOSIS — N3941 Urge incontinence: Secondary | ICD-10-CM | POA: Diagnosis not present

## 2018-04-10 DIAGNOSIS — R350 Frequency of micturition: Secondary | ICD-10-CM | POA: Diagnosis not present

## 2018-04-18 ENCOUNTER — Other Ambulatory Visit: Payer: Self-pay

## 2018-04-18 ENCOUNTER — Ambulatory Visit (INDEPENDENT_AMBULATORY_CARE_PROVIDER_SITE_OTHER): Payer: Medicare Other | Admitting: Neurology

## 2018-04-18 ENCOUNTER — Encounter: Payer: Self-pay | Admitting: Neurology

## 2018-04-18 VITALS — BP 142/70 | HR 69 | Ht 67.0 in | Wt 160.0 lb

## 2018-04-18 DIAGNOSIS — G20 Parkinson's disease: Secondary | ICD-10-CM | POA: Diagnosis not present

## 2018-04-18 DIAGNOSIS — I252 Old myocardial infarction: Secondary | ICD-10-CM | POA: Diagnosis not present

## 2018-04-18 DIAGNOSIS — I2510 Atherosclerotic heart disease of native coronary artery without angina pectoris: Secondary | ICD-10-CM | POA: Diagnosis not present

## 2018-04-18 NOTE — Progress Notes (Signed)
NEUROLOGY FOLLOW UP OFFICE NOTE  Kaitlyn Ortiz [id number] 1932/04/01  HISTORY OF PRESENT ILLNESS: I had the pleasure of seeing Kaitlyn Ortiz in follow-up in the neurology clinic on 04/18/2018.  The patient was last seen 3 months ago for parkinsonism, MSA vs idiopathic PD. She is again accompanied by her daughter who helps supplement the history today. Sinemet was started on her last visit. She is on Sinemet 25/164m TID without side effects. Her daughter feels that when she initially started the medication, she seemed cognitively better, but not the "with it-ness" is not as good again. She is doing a lot better sitting on a chair, she was having confusion figuring out how to sit on a chair previously. She has not noticed much tremor but is noted to have the left foot tremor in the office today. She goes to the [employer] 2-3 times a week. Appetite is good. She feels dizzy a lot when standing/walking, no falls or loss of consciousness. She has not been back to her urologist and does not want to do the stimulation therapy again. She denies any headaches, diplopia, focal numbness/tingling/weakness, no falls since last visit. She ambulates with a walker.  History on Initial Assessment 01/07/2018: This is an 84year old right-handed woman with a history of hypertension, dementia, presenting for evaluation of concern for parkinsonism. According to 10/30/17 note, she gets lost in the facility easily, she was noted to have decreased coordination with toe taps on left with slowing versus right, fast tremor of left leg at rest, seems to be able to stop on her own, bilateral UE intention tremor and slight resting tremor left greater than right, does not have classic shuffling gait, ambulates with rollator walker.   She reports her memory is fine. Her [place] had an episode of delirium while traveling, wandering around  the hotel in the middle of the night. She was found to have a UTI. She would occasionally mention driving to the supermarket and forgetting where to go. [facility] with a diagnosis of Mild Cognitive Impairment. She moved to [facility] in July 2018. She was noted to have spatial difficulties finding her table or the kitchen sink. At that time she was noted to have tremor and slightly masked facies. It was noted she met criteria for assisted living, but did not move until June 2019. She was having more balance issues and falls, and with a fall in June 2019, she was noted to be orthostatic, with improvement on holding BP medications. She has been seeing Urology for overactive bladder and has had a bladder stimulator which she feels helps a little. Medications are administered by staff. She now needs help with bathing and dressing, figuring out where the top is or legs of pants are. Her daughter manages finances. Her daughter notes that it is a big problem navigating her environment. She would sit and be unable to figure out how to do it, difficulty understanding how to get from standing to sitting on the toilet. She is taking Donepezil 82mdaily without side effects.  She reports dizziness when supine, but mostly when she gets up for dinner. She feels dizziness lasts for several minutes, she would grap hold of something that would stop her from falling. No neck/back pain, focal numbness/tingling/weakness. She wears a brace on both knees. She has chronic constipation  and reports that she would go once very 8-10 months, but now every 7-8 days. She reports blurred vision. No dysarthria/dysphagia. She has had decreased sense of smell for 10-15 years. She sleeps fine the first 5 hours, then wakes at midnight with light sleep, but feels refreshed in the morning. Her daughter notes she tends to nod off. She reports a left foot tremor that she  can control when she thinks about it. She feels it moves when she crosses her leg, but is noted to have a resting tremor today. She has noticed her fingers tremble a little for the past year, eating and handwriting is messy over the past 3-5 years. It takes her more than 24 hours to write a card.   I personally reviewed MRI brain without contrast done 11/15/17 which did not show any acute changes, there was moderate diffuse volume loss, mild to moderate chronic microvascular disease.  PAST MEDICAL HISTORY: Past Medical History:  Diagnosis Date  . Bronchiectasis ([place])   . Cognitive changes    mild  . Glaucoma   . Heart attack ([place])    mild  . Hypertension   . Incontinence     MEDICATIONS: Current Outpatient Medications on File Prior to Visit  Medication Sig Dispense Refill  . acetaminophen (TYLENOL) 325 MG tablet Take 650 mg by mouth every 4 (four) hours as needed.    Marland Kitchen amLODipine (NORVASC) 5 MG tablet Take 1 tablet (5 mg total) by mouth daily. 30 tablet 0  . Ascorbic Acid (VITAMIN C) 1000 MG tablet Take 1,000 mg by mouth daily.    Marland Kitchen aspirin 81 MG chewable tablet Chew 1 tablet by mouth daily.    . carbidopa-levodopa (SINEMET) 25-100 MG tablet Take 1/2 tablet three times a day with meals for 1 week, then increase to 1 tablet three times a day with meals 90 tablet 6  . Cholecalciferol (VITAMIN D3) 5000 units CAPS Take 1 capsule (5,000 Units total) by mouth daily. 30 capsule 5  . donepezil (ARICEPT) 5 MG tablet Take 1 tablet (5 mg total) by mouth at bedtime. 90 tablet 1  . Glucosamine-Chondroit-Vit C-Mn (GLUCOSAMINE 1500 COMPLEX PO) Take 1-2 tablets by mouth daily.    Marland Kitchen latanoprost (XALATAN) 0.005 % ophthalmic solution Place 1 drop into both eyes at bedtime.    . Lutein 6 MG CAPS Take 1 capsule by mouth daily.    . Multiple Vitamin (MULTIVITAMIN) tablet Take 1 tablet by mouth daily.    . Multiple Vitamins-Minerals (PRESERVISION AREDS 2+MULTI VIT) CAPS Take by mouth 2 (two) times daily.      . polyethylene glycol (MIRALAX / GLYCOLAX) packet Take 17 g by mouth daily as needed.    . pravastatin (PRAVACHOL) 40 MG tablet TAKE 1 TABLET ONCE DAILY. 90 tablet 0  . senna-docusate (SENEXON-S) 8.6-50 MG tablet Take 1 tablet by mouth daily.    . traMADol (ULTRAM) 50 MG tablet Take by mouth every 6 (six) hours as needed.     No current facility-administered medications on file prior to visit.     ALLERGIES: Allergies  Allergen Reactions  . Caffeine     FAMILY HISTORY: Family History  Problem Relation Age of Onset  . Stroke Father     SOCIAL HISTORY: Social History   Socioeconomic History  . Marital status: Single    Spouse name: Not on file  . Number of children: Not on file  . Years of education: Not on file  . Highest education level: Not on file  Occupational History  . Occupation: publishing  Social Needs  . Financial resource strain: Not hard at all  . Food insecurity:    Worry: Never true    Inability: Never true  . Transportation needs:    Medical: No    Non-medical: No  Tobacco Use  . Smoking status: Never Smoker  . Smokeless tobacco: Never Used  Substance and Sexual Activity  . Alcohol use: No  . Drug use: No  . Sexual activity: Not Currently  Lifestyle  . Physical activity:    Days per week: 0 days    Minutes per session: 0 min  . Stress: Only a little  Relationships  . Social connections:    Talks on phone: Twice a week    Gets together: Twice a week    Attends religious service: Never    Active member of club or organization: No    Attends meetings of clubs or organizations: Never    Relationship status: Never married  . Intimate partner violence:    Fear of current or ex partner: No    Emotionally abused: No    Physically abused: No    Forced sexual activity: No  Other Topics Concern  . Not on file  Social History Narrative   Social History      Diet? Lots of fruits and veggies      Do you drink/eat things with caffeine? no       Marital status?                            single        What year were you married? 1960      Do you live in a house, apartment, assisted living, condo, trailer, etc.? apartment      Is it one or more stories? one      How many persons live in your home? one      Do you have any pets in your home? (please list) no      Highest level of education completed? masters      Current or past profession: publishing      Advanced Directives      Do you exercise?           yes                           Type & how often? Walking- daily      Do you have a living will? yes      Do you have a DNR form?                                  If not, do you want to discuss one? no      Do you have signed POA/HPOA for forms? yes      Functional Status      Do you have difficulty bathing or dressing yourself? no      Do you have difficulty preparing food or eating?  no      Do you have difficulty managing your medications? no      Do you have difficulty managing your finances? no      Do you have difficulty affording your medications? no    REVIEW OF SYSTEMS: Constitutional: No fevers, chills, or sweats, no generalized fatigue, change in  appetite Eyes: No visual changes, double vision, eye pain Ear, nose and throat: No hearing loss, ear pain, nasal congestion, sore throat Cardiovascular: No chest pain, palpitations Respiratory:  No shortness of breath at rest or with exertion, wheezes GastrointestinaI: No nausea, vomiting, diarrhea, abdominal pain, fecal incontinence Genitourinary:  No dysuria, urinary retention or frequency Musculoskeletal:  No neck pain, back pain Integumentary: No rash, pruritus, skin lesions Neurological: as above Psychiatric: No depression, insomnia, anxiety Endocrine: No palpitations, fatigue, diaphoresis, mood swings, change in appetite, change in weight, increased thirst Hematologic/Lymphatic:  No anemia, purpura, petechiae. Allergic/Immunologic: no itchy/runny  eyes, nasal congestion, recent allergic reactions, rashes  PHYSICAL EXAM: Vitals:   04/18/18 1421  BP: (!) 142/70  Pulse: 69  SpO2: 96%   General: No acute distress, hypomimia Head:  Normocephalic/atraumatic Neck: supple, no paraspinal tenderness, full range of motion Heart:  Regular rate and rhythm Lungs:  Clear to auscultation bilaterally Back: No paraspinal tenderness Skin/Extremities: No rash, no edema Neurological Exam: alert and oriented to person, place, and time. No aphasia or dysarthria. Fund of knowledge is appropriate.  Recent and remote memory are intact.  Attention and concentration are normal.    Able to name objects and repeat phrases. Cranial nerves: Pupils equal, round, reactive to light. Extraocular movements intact with no nystagmus. Visual fields full, ?slight upgaze restriction. Facial sensation intact. No facial asymmetry. Tongue, uvula, palate midline.  Motor: Bulk and tone normal, minimal cogwheeling, R>L, muscle strength 5/5 throughout with no pronator drift.  Sensation to light touch intact.  No extinction to double simultaneous stimulation.  Deep tendon reflexes +2 throughout, toes downgoing.  Finger to nose testing intact. Gait fair strides with walker, no shuffling gait noted. +left leg resting tremor, no postural or action tremor. Fair finger taps, decreased foot tap on left. Negative pull test today.  IMPRESSION: This is an 84 yo RH woman with a history of hypertension, dementia, who presented for evaluation of concern for parkinsonism. She started having noticeable cognitive changes in 2018, at that time she was noted to have tremor and masked facies, her daughter reports progressive difficulty with spatial orientation.  On initial visit, she was noted to have a left foot resting tremor, mild cogwheeling, reduced finger and left foot taps. She also has a history of orthostatic hypotension, constipation, urinary incontinence. We had discussed how this constellation  of symptoms raises concern for Multiple System Atrophy (MSA-P), versus Parkinson's disease. It is unclear if there has been improvement in symptoms with Sinemet, she is on a low dose, we will increase to dose of Sinemet gradually to 25/127m 2 tabs TID. Continue to monitor for hypotension as we increase dose. She was encouraged to continue exercises. Continue Donepezil. Follow-up in 4-5 months, they know to call for any changes.   Thank you for allowing me to participate in her care.  Please do not hesitate to call for any questions or concerns.  The duration of this appointment visit was 26 minutes of face-to-face time with the patient.  Greater than 50% of this time was spent in counseling, explanation of diagnosis, planning of further management, and coordination of care.   KEllouise Newer M.D.   CC: Dr. RMariea Clonts

## 2018-04-18 NOTE — Patient Instructions (Signed)
1. Increase Sinemet 25/100mg : Take 1.5 tablets three times a day before meals for a week, then increase to 2 tabs three times a day before meals  2. Continue regular exercise  3. Continue to monitor BP as we increase Sinemet  4. Follow-up in 4-5 months, call for any changes

## 2018-04-24 DIAGNOSIS — R300 Dysuria: Secondary | ICD-10-CM | POA: Diagnosis not present

## 2018-04-24 DIAGNOSIS — D649 Anemia, unspecified: Secondary | ICD-10-CM | POA: Diagnosis not present

## 2018-04-24 LAB — BASIC METABOLIC PANEL
BUN: 18 (ref 4–21)
Creatinine: 0.8 (ref 0.5–1.1)
Glucose: 98
Potassium: 4 (ref 3.4–5.3)
Sodium: 143 (ref 137–147)

## 2018-04-24 LAB — CBC AND DIFFERENTIAL
HCT: 36 (ref 36–46)
Hemoglobin: 12.1 (ref 12.0–16.0)
Platelets: 196 (ref 150–399)
WBC: 4

## 2018-04-29 ENCOUNTER — Encounter: Payer: Self-pay | Admitting: Internal Medicine

## 2018-05-01 DIAGNOSIS — N3941 Urge incontinence: Secondary | ICD-10-CM | POA: Diagnosis not present

## 2018-05-01 DIAGNOSIS — R350 Frequency of micturition: Secondary | ICD-10-CM | POA: Diagnosis not present

## 2018-05-07 ENCOUNTER — Ambulatory Visit (INDEPENDENT_AMBULATORY_CARE_PROVIDER_SITE_OTHER): Payer: Medicare Other | Admitting: Cardiology

## 2018-05-07 ENCOUNTER — Encounter: Payer: Self-pay | Admitting: Cardiology

## 2018-05-07 VITALS — BP 110/60 | HR 58 | Ht 67.0 in | Wt 158.4 lb

## 2018-05-07 DIAGNOSIS — I10 Essential (primary) hypertension: Secondary | ICD-10-CM

## 2018-05-07 DIAGNOSIS — I2510 Atherosclerotic heart disease of native coronary artery without angina pectoris: Secondary | ICD-10-CM | POA: Diagnosis not present

## 2018-05-07 DIAGNOSIS — R011 Cardiac murmur, unspecified: Secondary | ICD-10-CM

## 2018-05-07 DIAGNOSIS — I77810 Thoracic aortic ectasia: Secondary | ICD-10-CM

## 2018-05-07 DIAGNOSIS — I712 Thoracic aortic aneurysm, without rupture, unspecified: Secondary | ICD-10-CM

## 2018-05-07 DIAGNOSIS — I252 Old myocardial infarction: Secondary | ICD-10-CM | POA: Diagnosis not present

## 2018-05-07 NOTE — Patient Instructions (Signed)
Medication Instructions:  Your physician has recommended you make the following change in your medication:   1 Decrease your Amlodipine to 2.5mg , half tablet, once per day  Labwork: None ordered.  Testing/Procedures: None ordered.  Follow-Up: Your physician recommends that you schedule a follow-up appointment in:   One Year with Dr Marlou Porch  Any Other Special Instructions Will Be Listed Below (If Applicable).     If you need a refill on your cardiac medications before your next appointment, please call your pharmacy.

## 2018-05-07 NOTE — Progress Notes (Signed)
Cardiology Office Note:    Date:  05/07/2018   ID:  Kaitlyn Ortiz, DOB Sep 06, 1931, MRN [id number]  PCP:  Kaitlyn Curry, DO  Cardiologist:  Kaitlyn Furbish, MD  Electrophysiologist:  None   Referring MD: Kaitlyn Curry, DO     History of Present Illness:    Kaitlyn Ortiz is a 85 y.o. female with dilated ascending aortic aneurysm, 4.5 cm 06/2017 followed by Dr. Roxan Ortiz here for follow-up of coronary artery disease  Aorta has been stable 4.5 cm.  Dementia has been noted and likely would not be a good surgical candidate.  Previously described episodes of angina, at one point she had an adult aspirin and got relief within a few minutes.  Takes isosorbide.  In 2011 she had an MI in [facility].  Lives at [facility].  Retired Clinical biochemist.  She has been experiencing some dizziness when standing.  She also feels some nausea when driving in the car.  Denies any chest pain, mild shortness of breath noted during heavy physical activity.  Past Medical History:  Diagnosis Date  . Bronchiectasis ([place])   . Cognitive changes    mild  . Glaucoma   . Heart attack ([place])    mild  . Hypertension   . Incontinence     Past Surgical History:  Procedure Laterality Date  . CATARACT EXTRACTION  1994  . CATARACT EXTRACTION  2006    Current Medications: Current Meds  Medication Sig  . acetaminophen (TYLENOL) 325 MG tablet Take 650 mg by mouth every 4 (four) hours as needed.  Marland Kitchen amLODipine (NORVASC) 5 MG tablet Take 1 tablet (5 mg total) by mouth daily.  . Ascorbic Acid (VITAMIN C) 1000 MG tablet Take 1,000 mg by mouth daily.  Marland Kitchen aspirin 81 MG chewable tablet Chew 1 tablet by mouth daily.  . carbidopa-levodopa (SINEMET) 25-100 MG tablet Take 1/2 tablet three times a day with meals for 1 week, then increase to 1 tablet three times a day with meals  . Cholecalciferol (VITAMIN D3) 5000 units CAPS Take 1 capsule (5,000 Units total) by mouth daily.  Marland Kitchen donepezil (ARICEPT) 5 MG tablet  Take 1 tablet (5 mg total) by mouth at bedtime.  . Glucosamine-Chondroit-Vit C-Mn (GLUCOSAMINE 1500 COMPLEX PO) Take 1-2 tablets by mouth daily.  Marland Kitchen latanoprost (XALATAN) 0.005 % ophthalmic solution Place 1 drop into both eyes at bedtime.  . Lutein 6 MG CAPS Take 1 capsule by mouth daily.  . Multiple Vitamin (MULTIVITAMIN) tablet Take 1 tablet by mouth daily.  . Multiple Vitamins-Minerals (PRESERVISION AREDS 2+MULTI VIT) CAPS Take by mouth 2 (two) times daily.  . polyethylene glycol (MIRALAX / GLYCOLAX) packet Take 17 g by mouth daily as needed.  . pravastatin (PRAVACHOL) 40 MG tablet TAKE 1 TABLET ONCE DAILY.  Marland Kitchen senna-docusate (SENEXON-S) 8.6-50 MG tablet Take 1 tablet by mouth daily.  . traMADol (ULTRAM) 50 MG tablet Take by mouth every 6 (six) hours as needed.     Allergies:   Caffeine   Social History   Socioeconomic History  . Marital status: Single    Spouse name: Not on file  . Number of children: Not on file  . Years of education: Not on file  . Highest education level: Not on file  Occupational History  . Occupation: publishing  Social Needs  . Financial resource strain: Not hard at all  . Food insecurity:    Worry: Never true    Inability: Never true  . Transportation needs:  Medical: No    Non-medical: No  Tobacco Use  . Smoking status: Never Smoker  . Smokeless tobacco: Never Used  Substance and Sexual Activity  . Alcohol use: No  . Drug use: No  . Sexual activity: Not Currently  Lifestyle  . Physical activity:    Days per week: 0 days    Minutes per session: 0 min  . Stress: Only a little  Relationships  . Social connections:    Talks on phone: Twice a week    Gets together: Twice a week    Attends religious service: Never    Active member of club or organization: No    Attends meetings of clubs or organizations: Never    Relationship status: Never married  Other Topics Concern  . Not on file  Social History Narrative   Social History      Diet?  Lots of fruits and veggies      Do you drink/eat things with caffeine? no      Marital status?                            single        What year were you married? 1960      Do you live in a house, apartment, assisted living, condo, trailer, etc.? apartment      Is it one or more stories? one      How many persons live in your home? one      Do you have any pets in your home? (please list) no      Highest level of education completed? masters      Current or past profession: publishing      Advanced Directives      Do you exercise?           yes                           Type & how often? Walking- daily      Do you have a living will? yes      Do you have a DNR form?                                  If not, do you want to discuss one? no      Do you have signed POA/HPOA for forms? yes      Functional Status      Do you have difficulty bathing or dressing yourself? no      Do you have difficulty preparing food or eating?  no      Do you have difficulty managing your medications? no      Do you have difficulty managing your finances? no      Do you have difficulty affording your medications? no     Family History: The patient's family history includes Stroke in her father.  ROS:   Please see the history of present illness.    Occasional vertigo, dizziness, nausea with car drives all other systems reviewed and are negative.  EKGs/Labs/Other Studies Reviewed:    The following studies were reviewed today:  03/18/2018-CT scan stable 4.5 cm ascending aortic aneurysm  EKG:  EKG is not ordered today. 05/02/17 shows sinus rhythm 67 with no other specific abnormalities.  Personally viewed-prior EKG from 04/10/17 demonstrates sinus bradycardia  60, LVH with no ischemic changes.  Recent Labs: 11/14/2017: ALT 24 11/17/2017: Magnesium 2.0 04/24/2018: BUN 18; Creatinine 0.8; Hemoglobin 12.1; Platelets 196; Potassium 4.0; Sodium 143  Recent Lipid Panel No results found for:  CHOL, TRIG, HDL, CHOLHDL, VLDL, LDLCALC, LDLDIRECT  Physical Exam:    VS:  BP 110/60   Pulse (!) 58   Ht 5\' 7"  (1.702 m)   Wt 158 lb 6.4 oz (71.8 kg)   SpO2 97%   BMI 24.81 kg/m     Wt Readings from Last 3 Encounters:  05/07/18 158 lb 6.4 oz (71.8 kg)  04/18/18 160 lb (72.6 kg)  03/18/18 157 lb (71.2 kg)     GEN:  Well nourished, well developed in no acute distress HEENT: Normal NECK: No JVD; No carotid bruits LYMPHATICS: No lymphadenopathy CARDIAC: RRR, no murmurs, rubs, gallops RESPIRATORY:  Clear to auscultation without rales, wheezing or rhonchi  ABDOMEN: Soft, non-tender, non-distended MUSCULOSKELETAL:  No edema; No deformity  SKIN: Warm and dry NEUROLOGIC:  Alert and oriented x 3 PSYCHIATRIC:  Normal affect   ASSESSMENT:    1. Coronary artery disease with history of myocardial infarction without history of CABG   2. Dilated aortic root (HCC)   3. Heart murmur   4. Benign hypertension   5. Thoracic aortic aneurysm without rupture (HCC)    PLAN:    In order of problems listed above:  Ascending aortic aneurysm -4.5 cm stable-Dr. Roxan Ortiz has been monitoring as well.  Continue to keep blood pressure under good control  Coronary artery disease status post CABG and MI - 2011.  Prior MI, medical management, too small for PCI.  Dementia -Aricept stable.  Dizziness when standing -Her blood pressure is under excellent control currently 110/60.  I wonder if we need to decrease her amlodipine to 2.5 mg once a day.  We will go ahead and write for this.  Lives at [facility]   Medication Adjustments/Labs and Tests Ordered: Current medicines are reviewed at length with the patient today.  Concerns regarding medicines are outlined above.  No orders of the defined types were placed in this encounter.  No orders of the defined types were placed in this encounter.   Patient Instructions  Medication Instructions:  Your physician has recommended you make the  following change in your medication:   1 Decrease your Amlodipine to 2.5mg , half tablet, once per day  Labwork: None ordered.  Testing/Procedures: None ordered.  Follow-Up: Your physician recommends that you schedule a follow-up appointment in:   One Year with Dr Marlou Porch  Any Other Special Instructions Will Be Listed Below (If Applicable).     If you need a refill on your cardiac medications before your next appointment, please call your pharmacy.     Signed, Kaitlyn Furbish, MD  05/07/2018 3:09 PM    [facility]

## 2018-05-09 ENCOUNTER — Other Ambulatory Visit: Payer: Self-pay

## 2018-05-09 NOTE — Progress Notes (Unsigned)
pro

## 2018-05-12 DIAGNOSIS — H938X2 Other specified disorders of left ear: Secondary | ICD-10-CM | POA: Diagnosis not present

## 2018-05-12 DIAGNOSIS — Z7289 Other problems related to lifestyle: Secondary | ICD-10-CM | POA: Diagnosis not present

## 2018-05-12 DIAGNOSIS — J343 Hypertrophy of nasal turbinates: Secondary | ICD-10-CM | POA: Diagnosis not present

## 2018-05-12 DIAGNOSIS — H6121 Impacted cerumen, right ear: Secondary | ICD-10-CM | POA: Diagnosis not present

## 2018-05-12 DIAGNOSIS — H903 Sensorineural hearing loss, bilateral: Secondary | ICD-10-CM | POA: Diagnosis not present

## 2018-05-13 ENCOUNTER — Ambulatory Visit: Payer: Self-pay

## 2018-05-14 ENCOUNTER — Ambulatory Visit: Payer: Medicare Other | Admitting: *Deleted

## 2018-05-14 DIAGNOSIS — M810 Age-related osteoporosis without current pathological fracture: Secondary | ICD-10-CM | POA: Diagnosis not present

## 2018-05-14 MED ORDER — DENOSUMAB 60 MG/ML ~~LOC~~ SOSY
60.0000 mg | PREFILLED_SYRINGE | Freq: Once | SUBCUTANEOUS | Status: AC
  Administered 2018-05-14: 60 mg via SUBCUTANEOUS

## 2018-05-22 DIAGNOSIS — R350 Frequency of micturition: Secondary | ICD-10-CM | POA: Diagnosis not present

## 2018-05-22 DIAGNOSIS — N3941 Urge incontinence: Secondary | ICD-10-CM | POA: Diagnosis not present

## 2018-06-02 DIAGNOSIS — H401132 Primary open-angle glaucoma, bilateral, moderate stage: Secondary | ICD-10-CM | POA: Diagnosis not present

## 2018-06-12 DIAGNOSIS — R350 Frequency of micturition: Secondary | ICD-10-CM | POA: Diagnosis not present

## 2018-06-12 DIAGNOSIS — N3941 Urge incontinence: Secondary | ICD-10-CM | POA: Diagnosis not present

## 2018-06-23 DIAGNOSIS — B351 Tinea unguium: Secondary | ICD-10-CM | POA: Diagnosis not present

## 2018-06-23 DIAGNOSIS — L84 Corns and callosities: Secondary | ICD-10-CM | POA: Diagnosis not present

## 2018-06-25 ENCOUNTER — Non-Acute Institutional Stay: Payer: Medicare Other | Admitting: Internal Medicine

## 2018-06-25 ENCOUNTER — Encounter: Payer: Self-pay | Admitting: Internal Medicine

## 2018-06-25 VITALS — BP 126/70 | HR 73 | Temp 97.7°F | Ht 67.0 in | Wt 161.0 lb

## 2018-06-25 DIAGNOSIS — G20C Parkinsonism, unspecified: Secondary | ICD-10-CM

## 2018-06-25 DIAGNOSIS — J479 Bronchiectasis, uncomplicated: Secondary | ICD-10-CM

## 2018-06-25 DIAGNOSIS — G20 Parkinson's disease: Secondary | ICD-10-CM

## 2018-06-25 DIAGNOSIS — I951 Orthostatic hypotension: Secondary | ICD-10-CM | POA: Diagnosis not present

## 2018-06-25 DIAGNOSIS — I252 Old myocardial infarction: Secondary | ICD-10-CM | POA: Diagnosis not present

## 2018-06-25 DIAGNOSIS — G8929 Other chronic pain: Secondary | ICD-10-CM | POA: Diagnosis not present

## 2018-06-25 DIAGNOSIS — M810 Age-related osteoporosis without current pathological fracture: Secondary | ICD-10-CM | POA: Diagnosis not present

## 2018-06-25 DIAGNOSIS — I872 Venous insufficiency (chronic) (peripheral): Secondary | ICD-10-CM

## 2018-06-25 DIAGNOSIS — I2510 Atherosclerotic heart disease of native coronary artery without angina pectoris: Secondary | ICD-10-CM

## 2018-06-25 DIAGNOSIS — M25512 Pain in left shoulder: Secondary | ICD-10-CM

## 2018-06-25 NOTE — Progress Notes (Signed)
Location:  Occupational psychologist of Service:  Clinic (12)  Provider: Gresham Caetano L. [name], D.O., C.M.D.  Code Status: DNR Goals of Care:  Advanced Directives 06/25/2018  Does Patient Have a Medical Advance Directive? Yes  Type of Paramedic of [place];Living will;Out of facility DNR (pink MOST or yellow form)  Does patient want to make changes to medical advance directive? No - Patient declined  Copy of [place] in Chart? Yes - validated most recent copy scanned in chart (See row information)  Pre-existing out of facility DNR order (yellow form or pink MOST form) Yellow form placed in chart (order not valid for inpatient use)     Chief Complaint  Patient presents with  . Medical Management of Chronic Issues    47mth follow-up    HPI: Patient is a 85 y.o. female seen today for medical management of chronic diseases.    Her daughter says she has good and bad days, but doing ok.    They were just talking and was disoriented to time about a story they were talking about.    Does not seem like the PTNS really helped her.  Medicine hasn't helped.    No falls lately.  Dizziness is improved.  Uses her rollator.    She has a scaly place on her lower cheek near her mouth.    Gets electric jolts in her tongue and mouth when she's sleeping.  Last for seconds--maybe 10-15.  One to two times per month.    Her left arm starts to hurt in her deiltoid if she reaches back to far.  She thinks it may be a delayed effect of her fall.  She has a stuffiness in her chest, shortness of breath and weight gain.  She has gained weight of 20 lbs since moving here.    She did not tolerate increasing her sinemet to 2 tabs tid before meals due to nightmares.  Remains on 1.5 tabs tid before meals.  She is exercising 2 times per week right now, but chair exercise is available 4x per week.    Past Medical History:  Diagnosis Date  . Bronchiectasis  ([place])   . Cognitive changes    mild  . Glaucoma   . Heart attack ([place])    mild  . Hypertension   . Incontinence     Past Surgical History:  Procedure Laterality Date  . CATARACT EXTRACTION  1994  . CATARACT EXTRACTION  2006    Allergies  Allergen Reactions  . Caffeine     Outpatient Encounter Medications as of 06/25/2018  Medication Sig  . amLODipine (NORVASC) 2.5 MG tablet Take 1 tablet by mouth daily.  . Ascorbic Acid (VITAMIN C) 1000 MG tablet Take 1,000 mg by mouth daily.  Marland Kitchen aspirin 81 MG chewable tablet Chew 1 tablet by mouth daily.  . bisacodyl (DULCOLAX) 10 MG suppository Place 10 mg rectally as needed for moderate constipation.  . carbidopa-levodopa (SINEMET IR) 25-100 MG tablet Take 2 tablets by mouth 3 (three) times daily before meals.  . Cholecalciferol (VITAMIN D3) 5000 units CAPS Take 1 capsule (5,000 Units total) by mouth daily.  Marland Kitchen denosumab (PROLIA) 60 MG/ML SOSY injection Inject 60 mg into the skin once for 1 dose.  . donepezil (ARICEPT) 5 MG tablet Take 1 tablet (5 mg total) by mouth at bedtime.  . Glucosamine-Chondroit-Vit C-Mn (GLUCOSAMINE 1500 COMPLEX PO) Take 1-2 tablets by mouth daily.  Marland Kitchen latanoprost (XALATAN) 0.005 %  ophthalmic solution Place 1 drop into both eyes at bedtime.  . Lutein 6 MG CAPS Take 1 capsule by mouth daily.  . Multiple Vitamin (MULTIVITAMIN) tablet Take 1 tablet by mouth daily.  . Multiple Vitamins-Minerals (PRESERVISION AREDS 2+MULTI VIT) CAPS Take by mouth 2 (two) times daily.  . polyethylene glycol (MIRALAX / GLYCOLAX) packet Take 17 g by mouth daily as needed.  . pravastatin (PRAVACHOL) 40 MG tablet TAKE 1 TABLET ONCE DAILY.  Marland Kitchen senna-docusate (SENEXON-S) 8.6-50 MG tablet Take 1 tablet by mouth daily.  . [DISCONTINUED] acetaminophen (TYLENOL) 325 MG tablet Take 650 mg by mouth every 4 (four) hours as needed.  . [DISCONTINUED] traMADol (ULTRAM) 50 MG tablet Take by mouth every 6 (six) hours as needed.  . [DISCONTINUED] amLODipine  (NORVASC) 5 MG tablet Take 1 tablet (5 mg total) by mouth daily.  . [DISCONTINUED] carbidopa-levodopa (SINEMET) 25-100 MG tablet Take 1/2 tablet three times a day with meals for 1 week, then increase to 1 tablet three times a day with meals   No facility-administered encounter medications on file as of 06/25/2018.     Review of Systems:  Review of Systems  Constitutional: Negative for chills, fever and malaise/fatigue.  HENT: Positive for congestion.   Eyes: Positive for blurred vision.       Glasses  Respiratory: Positive for cough and shortness of breath. Negative for sputum production and wheezing.        Chest congestion with tightness  Cardiovascular: Positive for leg swelling. Negative for chest pain, palpitations, orthopnea and PND.  Gastrointestinal: Positive for constipation. Negative for abdominal pain, blood in stool and diarrhea.       Controlled now  Genitourinary: Positive for frequency and urgency. Negative for dysuria.  Musculoskeletal: Negative for falls and joint pain.  Skin: Negative for itching and rash.       Small scaly area on right side of chin  Neurological: Positive for dizziness. Negative for loss of consciousness and headaches.       Shooting neuropathic pain rarely at night through jaw on right  Psychiatric/Behavioral: Positive for memory loss. Negative for depression. The patient is not nervous/anxious.     Health Maintenance  Topic Date Due  . TETANUS/TDAP  10/02/2027  . INFLUENZA VACCINE  Completed  . DEXA SCAN  Completed  . PNA vac Low Risk Adult  Completed    Physical Exam: Vitals:   06/25/18 1509  BP: 126/70  Pulse: 73  Temp: 97.7 F (36.5 C)  TempSrc: Oral  SpO2: 96%  Weight: 161 lb (73 kg)  Height: 5\' 7"  (1.702 m)   Body mass index is 25.22 kg/m. Physical Exam Vitals signs reviewed.  Constitutional:      General: She is not in acute distress.    Appearance: She is not toxic-appearing.  HENT:     Head: Normocephalic and  atraumatic.  Cardiovascular:     Rate and Rhythm: Normal rate and regular rhythm.     Pulses: Normal pulses.     Heart sounds: Normal heart sounds.  Pulmonary:     Effort: Pulmonary effort is normal.     Breath sounds: No stridor. Rhonchi present. No wheezing or rales.  Abdominal:     General: Bowel sounds are normal.  Musculoskeletal: Normal range of motion.        General: Tenderness present.     Comments: Over left deltoid especially with abduction or internal rotation  Skin:    General: Skin is warm and dry.  Comments: Small scaly area of skin on right jaw area  Neurological:     General: No focal deficit present.     Mental Status: She is alert.     Comments: Shuffling gait, masked facies; using rollator walker     Labs reviewed: Basic Metabolic Panel: Recent Labs    11/14/17 2357 11/15/17 0448 11/17/17 0401 04/24/18 0700  NA 140 140 139 143  K 4.2 3.9 3.8 4.0  CL 106 106 105  --   CO2 25 26 26   --   GLUCOSE 124* 109* 110*  --   BUN 24* 22 19 18   CREATININE 1.11* 0.90 0.87 0.8  CALCIUM 9.3 8.9 8.9  --   MG  --   --  2.0  --    Liver Function Tests: Recent Labs    11/14/17 2357  AST 32  ALT 24  ALKPHOS 87  BILITOT 0.9  PROT 7.0  ALBUMIN 4.0   No results for input(s): LIPASE, AMYLASE in the last 8760 hours. No results for input(s): AMMONIA in the last 8760 hours. CBC: Recent Labs    11/14/17 2357 11/15/17 0448 11/17/17 0401 04/24/18 0700  WBC 8.3 6.3 6.1 4.0  NEUTROABS 6.8  --   --   --   HGB 11.9* 11.7* 12.8 12.1  HCT 35.3* 35.4* 38.1 36  MCV 91.5 91.0 92.3  --   PLT 194 207 199 196   Lipid Panel: No results for input(s): CHOL, HDL, LDLCALC, TRIG, CHOLHDL, LDLDIRECT in the last 8760 hours. No results found for: HGBA1C  Procedures since last visit: No results found.  Assessment/Plan 1. Parkinsonism, unspecified Parkinsonism type ([place]) -continue sinemet as per Dr. Delice Lesch for possible multisystem atrophy suspect -cont exercise  program--reinforced today  2. Bronchiectasis without complication ([place]) -resume bid albuterol nebs she had when she was ill before--appears pulmonary intended this to be long term but pt opted to discontinue after a period of time when she recovered from here pneumonia -also start back with acapella device   3. Coronary artery disease with history of myocardial infarction without history of CABG -cont baby asa, statin therapy at this point due to patient's ongoing concerns about these risks  4. Senile osteoporosis -cont vitamin D and prolia, plus weightbearing exercise  5. Venous insufficiency -begin compression hose to knees for edema  6. Orthostatic hypotension -improved with sinemet, but does continue with some dizziness present  7.  Left shoulder pain:  Suspect some tendonitis of left rotator cuff region from prior fall -may use standing tylenol if needed or topicals otc like aspercreme bid if needed  Labs/tests ordered:  No new Next appt:  11/18/2018 for prolia, 4 mos with me  Sumeet Geter L. Corley Maffeo, D.O. [facility] [address], [place] [address] Cell Phone (Mon-Fri 8am-5pm):  [phone] On Call:  [phone] & follow prompts after 5pm & weekends Office Phone:  [phone] Office Fax:  [phone]

## 2018-07-30 ENCOUNTER — Other Ambulatory Visit: Payer: Self-pay | Admitting: *Deleted

## 2018-07-30 DIAGNOSIS — I712 Thoracic aortic aneurysm, without rupture, unspecified: Secondary | ICD-10-CM

## 2018-09-01 ENCOUNTER — Ambulatory Visit: Payer: Medicare Other | Admitting: Neurology

## 2018-10-03 ENCOUNTER — Other Ambulatory Visit: Payer: Self-pay

## 2018-10-03 ENCOUNTER — Ambulatory Visit
Admission: RE | Admit: 2018-10-03 | Discharge: 2018-10-03 | Disposition: A | Discharge status: Home / Self Care | Payer: Medicare Other | Source: Ambulatory Visit | Attending: Thoracic Surgery (Cardiothoracic Vascular Surgery) | Admitting: Thoracic Surgery (Cardiothoracic Vascular Surgery)

## 2018-10-03 DIAGNOSIS — I712 Thoracic aortic aneurysm, without rupture, unspecified: Secondary | ICD-10-CM

## 2018-10-03 DIAGNOSIS — R911 Solitary pulmonary nodule: Secondary | ICD-10-CM | POA: Diagnosis not present

## 2018-10-07 ENCOUNTER — Ambulatory Visit: Payer: Medicare Other | Admitting: Thoracic Surgery (Cardiothoracic Vascular Surgery)

## 2018-10-07 ENCOUNTER — Other Ambulatory Visit: Payer: Self-pay

## 2018-10-07 ENCOUNTER — Encounter: Payer: Self-pay | Admitting: Thoracic Surgery (Cardiothoracic Vascular Surgery)

## 2018-10-07 ENCOUNTER — Ambulatory Visit (INDEPENDENT_AMBULATORY_CARE_PROVIDER_SITE_OTHER): Payer: Medicare Other | Admitting: Thoracic Surgery (Cardiothoracic Vascular Surgery)

## 2018-10-07 VITALS — BP 137/84 | HR 60 | Temp 97.3°F | Resp 20 | Ht 67.0 in | Wt 160.0 lb

## 2018-10-07 DIAGNOSIS — I2510 Atherosclerotic heart disease of native coronary artery without angina pectoris: Secondary | ICD-10-CM

## 2018-10-07 DIAGNOSIS — I712 Thoracic aortic aneurysm, without rupture: Secondary | ICD-10-CM

## 2018-10-07 DIAGNOSIS — I252 Old myocardial infarction: Secondary | ICD-10-CM

## 2018-10-07 DIAGNOSIS — I7121 Aneurysm of the ascending aorta, without rupture: Secondary | ICD-10-CM

## 2018-10-07 NOTE — Progress Notes (Signed)
[address]       [place] [address]             [phone]     HPI: Kaitlyn Ortiz returns for follow-up regarding her ascending aneurysm  Kaitlyn Ortiz is an 85 year old woman with a past history significant for coronary disease, MI, hypertension, bronchiectasis, ascending aneurysm, glaucoma, incontinence, and early dementia.  She lives at [facility].  She was found to have a 4.5 cm ascending aneurysm in March 2019.  She also had multiple nodular densities and bronchiectasis in the lungs.  I last saw her in November 2019.  She was doing well at that time with no change in the aneurysm.  In the interim since her last visit she has been doing well.  She says for about the past week she has been having a shooting pain in her left chest that starts near her collarbone and descends nearly vertically towards her xiphoid area.  She says that this last for a few seconds and then resolves.  Past Medical History:  Diagnosis Date  . Bronchiectasis ([place])   . Cognitive changes    mild  . Glaucoma   . Heart attack ([place])    mild  . Hypertension   . Incontinence     Current Outpatient Medications  Medication Sig Dispense Refill  . amLODipine (NORVASC) 2.5 MG tablet Take 1 tablet by mouth daily.    . Ascorbic Acid (VITAMIN C) 1000 MG tablet Take 1,000 mg by mouth daily.    Marland Kitchen aspirin 81 MG chewable tablet Chew 1 tablet by mouth daily.    . bisacodyl (DULCOLAX) 10 MG suppository Place 10 mg rectally as needed for moderate constipation.    . carbidopa-levodopa (SINEMET IR) 25-100 MG tablet Take 2 tablets by mouth 3 (three) times daily before meals.    . Cholecalciferol (VITAMIN D3) 5000 units CAPS Take 1 capsule (5,000 Units total) by mouth daily. 30 capsule 5  . denosumab (PROLIA) 60 MG/ML SOSY injection Inject 60 mg into the skin once for 1 dose. 1 mL 0  . donepezil (ARICEPT) 5 MG tablet Take 1 tablet (5 mg total) by mouth at bedtime. 90 tablet 1  .  Glucosamine-Chondroit-Vit C-Mn (GLUCOSAMINE 1500 COMPLEX PO) Take 1-2 tablets by mouth daily.    Marland Kitchen latanoprost (XALATAN) 0.005 % ophthalmic solution Place 1 drop into both eyes at bedtime.    . Lutein 6 MG CAPS Take 1 capsule by mouth daily.    . Multiple Vitamin (MULTIVITAMIN) tablet Take 1 tablet by mouth daily.    . Multiple Vitamins-Minerals (PRESERVISION AREDS 2+MULTI VIT) CAPS Take by mouth 2 (two) times daily.    . polyethylene glycol (MIRALAX / GLYCOLAX) packet Take 17 g by mouth daily as needed.    . pravastatin (PRAVACHOL) 40 MG tablet TAKE 1 TABLET ONCE DAILY. 90 tablet 0  . senna-docusate (SENEXON-S) 8.6-50 MG tablet Take 1 tablet by mouth daily.     No current facility-administered medications for this visit.     Physical Exam BP 137/84   Pulse 60   Temp (!) 97.3 F (36.3 C) (Skin)   Resp 20   Ht 5\' 7"  (1.702 m)   Wt 160 lb (72.6 kg)   SpO2 94% Comment: RA  BMI 25.12 kg/m  85 year old woman in no acute distress, wearing facemask Alert and oriented to person and place Shuffling gait No carotid bruits Cardiac regular rate and rhythm with a normal S1 and S2 no rubs  or murmurs Lungs clear with equal breath sounds bilaterally  Diagnostic Tests: CT CHEST WITHOUT CONTRAST  TECHNIQUE: Multidetector CT imaging of the chest was performed following the standard protocol without IV contrast.  COMPARISON:  03/18/2018  FINDINGS: Cardiovascular: 4.5 cm ascending thoracic aortic remains stable. Aortic and coronary artery atherosclerosis again noted.  Mediastinum/Nodes: No masses or pathologically enlarged lymph nodes identified on this unenhanced exam.  Lungs/Pleura: Stable mild bilateral pleural-parenchymal scarring and mild traction bronchiectasis. 6 mm pulmonary nodule in medial right lung apex remains stable. No new or enlarging pulmonary nodules or masses identified. No evidence of pulmonary infiltrate or pleural effusion.  Upper Abdomen:  Unremarkable.   Musculoskeletal:  No suspicious bone lesions.  IMPRESSION: 1. Stable 4.5 cm ascending thoracic aortic aneurysm. Recommend semi-annual imaging followup by CTA or MRA, and referral to cardiothoracic surgery if not already obtained. This recommendation follows 2010 ACCF/AHA/AATS/ACR/ASA/SCA/SCAI/SIR/STS/SVM Guidelines for the Diagnosis and Management of Patients With Thoracic Aortic Disease. 2010; 121: H209-O709. 2. Stable 6 mm pulmonary nodule in medial right lung apex, consistent with benign etiology. No active lung disease. 3. Aortic and coronary artery atherosclerosis.  Aortic Atherosclerosis (ICD10-I70.0). Coronary artery atherosclerosis.   Electronically Signed   By: Earle Gell M.D.   On: 10/03/2018 13:15 I personally reviewed the CT images and concur with the findings noted above  Impression: Kaitlyn Ortiz is an 85 year old woman with history of hypertension, coronary disease, MI, 4.5 cm ascending aneurysm, bronchiectasis, and early dementia.  She was first found to have an ascending aneurysm about a year ago.  Ascending aneurysm-stable at 4.5 cm.  No indication for surgery.  Recommendation is continued semiannual follow-up.  Chest pain-very atypical.  Likely musculoskeletal.  Pulmonary nodule-6 mm nodule in the right lung, stable, likely benign.  Plan: Return in 6 months with CT angiogram  Melrose Nakayama, MD [facility] [phone]

## 2018-10-08 DIAGNOSIS — Z20828 Contact with and (suspected) exposure to other viral communicable diseases: Secondary | ICD-10-CM | POA: Diagnosis not present

## 2018-10-28 DIAGNOSIS — B351 Tinea unguium: Secondary | ICD-10-CM | POA: Diagnosis not present

## 2018-10-28 DIAGNOSIS — L84 Corns and callosities: Secondary | ICD-10-CM | POA: Diagnosis not present

## 2018-11-05 ENCOUNTER — Other Ambulatory Visit: Payer: Self-pay

## 2018-11-05 ENCOUNTER — Non-Acute Institutional Stay: Payer: Medicare Other | Admitting: Internal Medicine

## 2018-11-05 ENCOUNTER — Encounter: Payer: Self-pay | Admitting: Internal Medicine

## 2018-11-05 VITALS — BP 110/70 | HR 67 | Temp 97.4°F | Ht 67.0 in | Wt 161.0 lb

## 2018-11-05 DIAGNOSIS — I252 Old myocardial infarction: Secondary | ICD-10-CM | POA: Diagnosis not present

## 2018-11-05 DIAGNOSIS — I712 Thoracic aortic aneurysm, without rupture, unspecified: Secondary | ICD-10-CM

## 2018-11-05 DIAGNOSIS — J479 Bronchiectasis, uncomplicated: Secondary | ICD-10-CM | POA: Diagnosis not present

## 2018-11-05 DIAGNOSIS — I2510 Atherosclerotic heart disease of native coronary artery without angina pectoris: Secondary | ICD-10-CM | POA: Diagnosis not present

## 2018-11-05 DIAGNOSIS — F321 Major depressive disorder, single episode, moderate: Secondary | ICD-10-CM | POA: Diagnosis not present

## 2018-11-05 DIAGNOSIS — N3281 Overactive bladder: Secondary | ICD-10-CM | POA: Diagnosis not present

## 2018-11-05 DIAGNOSIS — G20 Parkinson's disease: Secondary | ICD-10-CM | POA: Diagnosis not present

## 2018-11-05 DIAGNOSIS — G903 Multi-system degeneration of the autonomic nervous system: Secondary | ICD-10-CM | POA: Diagnosis not present

## 2018-11-05 DIAGNOSIS — I10 Essential (primary) hypertension: Secondary | ICD-10-CM | POA: Diagnosis not present

## 2018-11-05 DIAGNOSIS — G20C Parkinsonism, unspecified: Secondary | ICD-10-CM

## 2018-11-05 DIAGNOSIS — F22 Delusional disorders: Secondary | ICD-10-CM | POA: Diagnosis not present

## 2018-11-05 DIAGNOSIS — F0150 Vascular dementia without behavioral disturbance: Secondary | ICD-10-CM

## 2018-11-05 NOTE — Progress Notes (Signed)
Location:  [facility] clinic Provider:  Vidyuth Belsito L. [name], D.O., C.M.D.  Code Status: DNR Goals of Care:  Advanced Directives 06/25/2018  Does Patient Have a Medical Advance Directive? Yes  Type of Paramedic of [place];Living will;Out of facility DNR (pink MOST or yellow form)  Does patient want to make changes to medical advance directive? No - Patient declined  Copy of [place] in Chart? Yes - validated most recent copy scanned in chart (See row information)  Pre-existing out of facility DNR order (yellow form or pink MOST form) Yellow form placed in chart (order not valid for inpatient use)     Chief Complaint  Patient presents with  . Medical Management of Chronic Issues    104mth follow-up    HPI: Patient is a 85 y.o. female with a parkinson's syndrome (multisystem atrophy suspected), overactive bladder, bronchiectasis, thoracic aortic aneurysm, htn, CAD, dementia, hyperlipidemia, falls seen today for medical management of chronic diseases.  She's here alone due to covid isolation.    She feels like she is losing her temper with people easily.  CNA said she could help her with anything and then she made her do it.  She is tearful telling me this.  Says the things that bother her physically are nuisance things like her bladder control.  It's not getting worse anymore.  She stopping going back to urology.  She actually thinks it's a little better right now.  She is trying the things that were suggested for her.  She is drinking 5 pints of water per day and not after her evening meal.  She has also been doing the kegel exercises.  They also help her.  She can almost go 2 hours at a time now.  The other nuisance thing is her hearing aids.  She's working to set up an appt with the audiology office to get assistance with this.  No matter who puts them on her, they fall off toward the end of the day. They go in and around her ears.  That was meant to be  sorted out the week of 3/21.    She's very tearful and feels like she's not in control.    She has a dental appt end of this month.    Says she has too much time to feel sorry for herself.  She sees the neurologist soon--actually, it's in December.    No chest pain.  She is breathing well except on very muggy afternoons.  She feels like she could use more oxygen, but it's not a problem yet.  She's wearing TED socks and dislikes them.  She uses her pickle thing.  She uses it religiously.  I asked her about the nebulizer txs.  It suddenly started to taste terrible like poison.  She was worried and turned it off immediately.  Nobody else could see a problem with it.  She refused to use.  She will actually dream about the nebulizers.  She said it tasted like her mouthwash.  She then thought maybe they were both out of date.    Past Medical History:  Diagnosis Date  . Bronchiectasis ([place])   . Cognitive changes    mild  . Glaucoma   . Heart attack ([place])    mild  . Hypertension   . Incontinence     Past Surgical History:  Procedure Laterality Date  . CATARACT EXTRACTION  1994  . CATARACT EXTRACTION  2006    Allergies  Allergen Reactions  . Caffeine     Outpatient Encounter Medications as of 11/05/2018  Medication Sig  . albuterol (ACCUNEB) 0.63 MG/3ML nebulizer solution Take 1 ampule by nebulization 2 (two) times daily as needed for wheezing or shortness of breath.  Marland Kitchen amLODipine (NORVASC) 2.5 MG tablet Take 1 tablet by mouth daily.  . Ascorbic Acid (VITAMIN C) 1000 MG tablet Take 1,000 mg by mouth daily.  Marland Kitchen aspirin 81 MG chewable tablet Chew 1 tablet by mouth daily.  . bisacodyl (DULCOLAX) 10 MG suppository Place 10 mg rectally as needed for moderate constipation.  . carbidopa-levodopa (SINEMET IR) 25-100 MG tablet Take 2 tablets by mouth 3 (three) times daily before meals.  . Cholecalciferol (VITAMIN D3) 5000 units CAPS Take 1 capsule (5,000 Units total) by mouth daily.  Marland Kitchen denosumab  (PROLIA) 60 MG/ML SOSY injection Inject 60 mg into the skin once for 1 dose.  . donepezil (ARICEPT) 5 MG tablet Take 1 tablet (5 mg total) by mouth at bedtime.  . Glucosamine-Chondroit-Vit C-Mn (GLUCOSAMINE 1500 COMPLEX PO) Take 1-2 tablets by mouth daily.  Marland Kitchen latanoprost (XALATAN) 0.005 % ophthalmic solution Place 1 drop into both eyes at bedtime.  . Lutein 6 MG CAPS Take 1 capsule by mouth daily.  . Multiple Vitamin (MULTIVITAMIN) tablet Take 1 tablet by mouth daily.  . Multiple Vitamins-Minerals (PRESERVISION AREDS 2+MULTI VIT) CAPS Take by mouth 2 (two) times daily.  . polyethylene glycol (MIRALAX / GLYCOLAX) packet Take 17 g by mouth daily as needed.  . pravastatin (PRAVACHOL) 40 MG tablet TAKE 1 TABLET ONCE DAILY.  Marland Kitchen senna-docusate (SENEXON-S) 8.6-50 MG tablet Take 1 tablet by mouth daily.   No facility-administered encounter medications on file as of 11/05/2018.     Review of Systems:  Review of Systems  Constitutional: Positive for malaise/fatigue. Negative for chills and fever.  HENT: Positive for hearing loss. Negative for congestion.        Not wearing hearing aids; says they fall out by the end of the day  Eyes: Negative for blurred vision.       Glasses  Respiratory: Negative for cough, shortness of breath and wheezing.   Cardiovascular: Positive for leg swelling. Negative for chest pain and palpitations.  Gastrointestinal: Negative for abdominal pain, blood in stool, constipation, diarrhea and melena.  Genitourinary: Positive for frequency and urgency. Negative for dysuria, flank pain and hematuria.  Musculoskeletal: Positive for falls. Negative for back pain, joint pain and myalgias.       [place] 6/25, no injury  Skin: Negative for itching and rash.  Neurological: Negative for dizziness and loss of consciousness.  Endo/Heme/Allergies: Bruises/bleeds easily.  Psychiatric/Behavioral: Positive for depression and memory loss. The patient is nervous/anxious. The patient does not  have insomnia.        May be having delusion about poison in nebulizer and mouthwash--still sorting this out    Health Maintenance  Topic Date Due  . INFLUENZA VACCINE  11/29/2018  . TETANUS/TDAP  10/02/2027  . DEXA SCAN  Completed  . PNA vac Low Risk Adult  Completed    Physical Exam: Vitals:   11/05/18 0902  BP: 110/70  Pulse: 67  Temp: (!) 97.4 F (36.3 C)  TempSrc: Oral  SpO2: 96%  Weight: 161 lb (73 kg)  Height: 5\' 7"  (1.702 m)   Body mass index is 25.22 kg/m. Physical Exam Constitutional:      General: She is not in acute distress.    Appearance: Normal appearance. She is normal weight. She  is not toxic-appearing.  HENT:     Head: Normocephalic and atraumatic.     Ears:     Comments: HOH, not wearing hearing aids Cardiovascular:     Rate and Rhythm: Normal rate and regular rhythm.     Pulses: Normal pulses.     Heart sounds: Normal heart sounds.  Pulmonary:     Effort: Pulmonary effort is normal.     Breath sounds: Normal breath sounds.  Musculoskeletal: Normal range of motion.     Right lower leg: Edema present.     Left lower leg: Edema present.     Comments: Mild nonpitting edema; wearing ted hose for orthostasis  Skin:    General: Skin is warm and dry.     Coloration: Skin is pale.  Neurological:     General: No focal deficit present.     Mental Status: She is alert.     Motor: Weakness present.     Gait: Gait abnormal.     Comments: Shuffling gait; uses rollator; masked facies  Psychiatric:     Comments: Tearful today     Labs reviewed: Basic Metabolic Panel: Recent Labs    11/14/17 2357 11/15/17 0448 11/17/17 0401 04/24/18 0700  NA 140 140 139 143  K 4.2 3.9 3.8 4.0  CL 106 106 105  --   CO2 25 26 26   --   GLUCOSE 124* 109* 110*  --   BUN 24* 22 19 18   CREATININE 1.11* 0.90 0.87 0.8  CALCIUM 9.3 8.9 8.9  --   MG  --   --  2.0  --    Liver Function Tests: Recent Labs    11/14/17 2357  AST 32  ALT 24  ALKPHOS 87  BILITOT  0.9  PROT 7.0  ALBUMIN 4.0   No results for input(s): LIPASE, AMYLASE in the last 8760 hours. No results for input(s): AMMONIA in the last 8760 hours. CBC: Recent Labs    11/14/17 2357 11/15/17 0448 11/17/17 0401 04/24/18 0700  WBC 8.3 6.3 6.1 4.0  NEUTROABS 6.8  --   --   --   HGB 11.9* 11.7* 12.8 12.1  HCT 35.3* 35.4* 38.1 36  MCV 91.5 91.0 92.3  --   PLT 194 207 199 196   Lipid Panel: No results for input(s): CHOL, HDL, LDLCALC, TRIG, CHOLHDL, LDLDIRECT in the last 8760 hours. No results found for: HGBA1C  Procedures since last visit: No results found.  Assessment/Plan 1. Thoracic aortic aneurysm without rupture [facility]) -repeat CTA in December (last in June) and she saw Dr. Roxan Hockey  2. Essential hypertension -bp at goal with current regimen and no recent reports of dizziness on standing  3. Bronchiectasis without complication ([place]) -she has been refusing her nebs and tells me it's because they taste funny and she thinks they're expired--taste is similar to her mouthwash which she's concerned may also be expired -I had only been told about the refusal not the reasoning--we'll look into it, but, of course, she should not be receiving any expired medications--there may be a paranoid delusion involved here since she says it tastes like it's poison so will work with staff to evaluate and sort this out  4. Parkinsonism, unspecified Parkinsonism type ([place]) -appt with neuro is not until December--Dr. Delice Lesch -she is wanting to be seen sooner--will ask [facility] nursing if they can move up appt  5. Vascular dementia without behavioral disturbance ([place]) -she seems a bit more confused about things today and out of sorts  as many of the residents with cognitive impairment do right night amid covid--they cannot see family except virtually -cont aricept  6. Overactive bladder -cont conservative measures as in hpi -does not want to return to urology at all  7. Neurogenic orthostatic  hypotension ([facility]) -improved with use of compression hose  8. Paranoid delusion ([place]) -believes neb txs and mouthwash taste like poison so refusing them  9. Depression, major, single episode, moderate ([place]) -did not want to take a prescription for her mood, but is notably tearful and down and frustrated with her chronic conditions -she misses being more independent -she is agreeable to some counseling OUTSIDE of [facility] (could be virtual right now, perhaps)    Labs/tests ordered: cbc, cmp, flp before Next appt:  F/u 3-4 mos med mgt   Naylah Cork L. Warden Buffa, D.O. [facility] [address], [place] [address] Cell Phone (Mon-Fri 8am-5pm):  [phone] On Call:  [phone] & follow prompts after 5pm & weekends Office Phone:  [phone] Office Fax:  [phone]

## 2018-11-11 ENCOUNTER — Ambulatory Visit: Payer: Medicare Other | Admitting: Thoracic Surgery (Cardiothoracic Vascular Surgery)

## 2018-11-18 ENCOUNTER — Ambulatory Visit: Payer: Medicare Other

## 2018-11-19 ENCOUNTER — Other Ambulatory Visit: Payer: Self-pay

## 2018-11-19 ENCOUNTER — Ambulatory Visit: Payer: Medicare Other | Admitting: *Deleted

## 2018-11-19 DIAGNOSIS — M810 Age-related osteoporosis without current pathological fracture: Secondary | ICD-10-CM | POA: Diagnosis not present

## 2018-11-19 MED ORDER — DENOSUMAB 60 MG/ML ~~LOC~~ SOSY
60.0000 mg | PREFILLED_SYRINGE | Freq: Once | SUBCUTANEOUS | Status: AC
  Administered 2018-11-19: 60 mg via SUBCUTANEOUS

## 2018-11-27 ENCOUNTER — Encounter: Payer: Self-pay | Admitting: Adult Health

## 2018-11-27 ENCOUNTER — Non-Acute Institutional Stay: Payer: Medicare Other | Admitting: Adult Health

## 2018-11-27 DIAGNOSIS — Z0000 Encounter for general adult medical examination without abnormal findings: Secondary | ICD-10-CM

## 2018-11-27 NOTE — Patient Instructions (Signed)
Ms. Kaitlyn Ortiz , Thank you for taking time to come for your Medicare Wellness Visit. I appreciate your ongoing commitment to your health goals. Please review the following plan we discussed and let me know if I can assist you in the future.   Screening recommendations/referrals: Colonoscopy aged out Mammogram aged out Bone Density not due Recommended yearly ophthalmology/optometry visit for glaucoma screening and checkup Recommended yearly dental visit for hygiene and checkup  Vaccinations: Influenza vaccine up to date Pneumococcal vaccine up to date Tdap vaccine up to date Shingles vaccine up to date    Advanced directives: reviewed  Conditions/risks identified: fall risk  Next appointment:  1 year   Preventive Care 3 Years and Older, Female Preventive care refers to lifestyle choices and visits with your health care provider that can promote health and wellness. What does preventive care include?  A yearly physical exam. This is also called an annual well check.  Dental exams once or twice a year.  Routine eye exams. Ask your health care provider how often you should have your eyes checked.  Personal lifestyle choices, including:  Daily care of your teeth and gums.  Regular physical activity.  Eating a healthy diet.  Avoiding tobacco and drug use.  Limiting alcohol use.  Practicing safe sex.  Taking low-dose aspirin every day.  Taking vitamin and mineral supplements as recommended by your health care provider. What happens during an annual well check? The services and screenings done by your health care provider during your annual well check will depend on your age, overall health, lifestyle risk factors, and family history of disease. Counseling  Your health care provider may ask you questions about your:  Alcohol use.  Tobacco use.  Drug use.  Emotional well-being.  Home and relationship well-being.  Sexual activity.  Eating habits.  History of  falls.  Memory and ability to understand (cognition).  Work and work Statistician.  Reproductive health. Screening  You may have the following tests or measurements:  Height, weight, and BMI.  Blood pressure.  Lipid and cholesterol levels. These may be checked every 5 years, or more frequently if you are over 61 years old.  Skin check.  Lung cancer screening. You may have this screening every year starting at age 40 if you have a 30-pack-year history of smoking and currently smoke or have quit within the past 15 years.  Fecal occult blood test (FOBT) of the stool. You may have this test every year starting at age 25.  Flexible sigmoidoscopy or colonoscopy. You may have a sigmoidoscopy every 5 years or a colonoscopy every 10 years starting at age 81.  Hepatitis C blood test.  Hepatitis B blood test.  Sexually transmitted disease (STD) testing.  Diabetes screening. This is done by checking your blood sugar (glucose) after you have not eaten for a while (fasting). You may have this done every 1-3 years.  Bone density scan. This is done to screen for osteoporosis. You may have this done starting at age 18.  Mammogram. This may be done every 1-2 years. Talk to your health care provider about how often you should have regular mammograms. Talk with your health care provider about your test results, treatment options, and if necessary, the need for more tests. Vaccines  Your health care provider may recommend certain vaccines, such as:  Influenza vaccine. This is recommended every year.  Tetanus, diphtheria, and acellular pertussis (Tdap, Td) vaccine. You may need a Td booster every 10 years.  Zoster vaccine.  You may need this after age 47.  Pneumococcal 13-valent conjugate (PCV13) vaccine. One dose is recommended after age 46.  Pneumococcal polysaccharide (PPSV23) vaccine. One dose is recommended after age 62. Talk to your health care provider about which screenings and  vaccines you need and how often you need them. This information is not intended to replace advice given to you by your health care provider. Make sure you discuss any questions you have with your health care provider. Document Released: 05/13/2015 Document Revised: 01/04/2016 Document Reviewed: 02/15/2015 Elsevier Interactive Patient Education  2017 Section Prevention in the Home Falls can cause injuries. They can happen to people of all ages. There are many things you can do to make your home safe and to help prevent falls. What can I do on the outside of my home?  Regularly fix the edges of walkways and driveways and fix any cracks.  Remove anything that might make you trip as you walk through a door, such as a raised step or threshold.  Trim any bushes or trees on the path to your home.  Use bright outdoor lighting.  Clear any walking paths of anything that might make someone trip, such as rocks or tools.  Regularly check to see if handrails are loose or broken. Make sure that both sides of any steps have handrails.  Any raised decks and porches should have guardrails on the edges.  Have any leaves, snow, or ice cleared regularly.  Use sand or salt on walking paths during winter.  Clean up any spills in your garage right away. This includes oil or grease spills. What can I do in the bathroom?  Use night lights.  Install grab bars by the toilet and in the tub and shower. Do not use towel bars as grab bars.  Use non-skid mats or decals in the tub or shower.  If you need to sit down in the shower, use a plastic, non-slip stool.  Keep the floor dry. Clean up any water that spills on the floor as soon as it happens.  Remove soap buildup in the tub or shower regularly.  Attach bath mats securely with double-sided non-slip rug tape.  Do not have throw rugs and other things on the floor that can make you trip. What can I do in the bedroom?  Use night lights.   Make sure that you have a light by your bed that is easy to reach.  Do not use any sheets or blankets that are too big for your bed. They should not hang down onto the floor.  Have a firm chair that has side arms. You can use this for support while you get dressed.  Do not have throw rugs and other things on the floor that can make you trip. What can I do in the kitchen?  Clean up any spills right away.  Avoid walking on wet floors.  Keep items that you use a lot in easy-to-reach places.  If you need to reach something above you, use a strong step stool that has a grab bar.  Keep electrical cords out of the way.  Do not use floor polish or wax that makes floors slippery. If you must use wax, use non-skid floor wax.  Do not have throw rugs and other things on the floor that can make you trip. What can I do with my stairs?  Do not leave any items on the stairs.  Make sure that there are handrails on  both sides of the stairs and use them. Fix handrails that are broken or loose. Make sure that handrails are as long as the stairways.  Check any carpeting to make sure that it is firmly attached to the stairs. Fix any carpet that is loose or worn.  Avoid having throw rugs at the top or bottom of the stairs. If you do have throw rugs, attach them to the floor with carpet tape.  Make sure that you have a light switch at the top of the stairs and the bottom of the stairs. If you do not have them, ask someone to add them for you. What else can I do to help prevent falls?  Wear shoes that:  Do not have high heels.  Have rubber bottoms.  Are comfortable and fit you well.  Are closed at the toe. Do not wear sandals.  If you use a stepladder:  Make sure that it is fully opened. Do not climb a closed stepladder.  Make sure that both sides of the stepladder are locked into place.  Ask someone to hold it for you, if possible.  Clearly mark and make sure that you can see:  Any  grab bars or handrails.  First and last steps.  Where the edge of each step is.  Use tools that help you move around (mobility aids) if they are needed. These include:  Canes.  Walkers.  Scooters.  Crutches.  Turn on the lights when you go into a dark area. Replace any light bulbs as soon as they burn out.  Set up your furniture so you have a clear path. Avoid moving your furniture around.  If any of your floors are uneven, fix them.  If there are any pets around you, be aware of where they are.  Review your medicines with your doctor. Some medicines can make you feel dizzy. This can increase your chance of falling. Ask your doctor what other things that you can do to help prevent falls. This information is not intended to replace advice given to you by your health care provider. Make sure you discuss any questions you have with your health care provider. Document Released: 02/10/2009 Document Revised: 09/22/2015 Document Reviewed: 05/21/2014 Elsevier Interactive Patient Education  2017 [employer].

## 2018-11-27 NOTE — Progress Notes (Signed)
Subjective:   Kaitlyn Ortiz is a 85 y.o. female who presents for Medicare Annual (Subsequent) preventive examination.  Review of Systems:   Cardiac Risk Factors include: advanced age (>83men, >69 women);sedentary lifestyle     Objective:     Vitals: Wt 163 lb (73.9 kg)   BMI 25.53 kg/m   Body mass index is 25.53 kg/m.  Advanced Directives 06/25/2018 11/19/2017 11/15/2017 11/14/2017 10/30/2017 10/01/2017 07/17/2017  Does Patient Have a Medical Advance Directive? Yes Yes Yes Yes Yes Yes Yes  Type of Paramedic of [place];Living will;Out of facility DNR (pink MOST or yellow form) [place];Living will;Out of facility DNR (pink MOST or yellow form) [place];Out of facility DNR (pink MOST or yellow form) [place];Out of facility DNR (pink MOST or yellow form) [place];Out of facility DNR (pink MOST or yellow form) Healthcare Power of [place];Living will  Does patient want to make changes to medical advance directive? No - Patient declined No - Patient declined No - Patient declined - No - Patient declined No - Patient declined No - Patient declined  Copy of [place] in Chart? Yes - validated most recent copy scanned in chart (See row information) Yes No - copy requested No - copy requested Yes Yes Yes  Pre-existing out of facility DNR order (yellow form or pink MOST form) Yellow form placed in chart (order not valid for inpatient use) Yellow form placed in chart (order not valid for inpatient use) Yellow form placed in chart (order not valid for inpatient use) Yellow form placed in chart (order not valid for inpatient use) Yellow form placed in chart (order not valid for inpatient use) - -    Tobacco Social History   Tobacco Use  Smoking Status Never Smoker  Smokeless Tobacco Never Used     Counseling given: Not Answered   Clinical  Intake:  Pre-visit preparation completed: No  Pain : No/denies pain     BMI - recorded: 25.53 Nutritional Status: BMI 25 -29 Overweight Nutritional Risks: None Diabetes: No  How often do you need to have someone help you when you read instructions, pamphlets, or other written materials from your doctor or pharmacy?: 3 - Sometimes What is the last grade level you completed in school?: masters degree  Interpreter Needed?: No     Past Medical History:  Diagnosis Date  . Bronchiectasis ([place])   . Cognitive changes    mild  . Glaucoma   . Heart attack ([place])    mild  . Hypertension   . Incontinence    Past Surgical History:  Procedure Laterality Date  . CATARACT EXTRACTION  1994  . CATARACT EXTRACTION  2006   Family History  Problem Relation Age of Onset  . Stroke Father    Social History   Socioeconomic History  . Marital status: Single    Spouse name: Not on file  . Number of children: Not on file  . Years of education: Not on file  . Highest education level: Not on file  Occupational History  . Occupation: publishing  Social Needs  . Financial resource strain: Not hard at all  . Food insecurity    Worry: Never true    Inability: Never true  . Transportation needs    Medical: No    Non-medical: No  Tobacco Use  . Smoking status: Never Smoker  . Smokeless tobacco: Never Used  Substance  and Sexual Activity  . Alcohol use: No  . Drug use: No  . Sexual activity: Not Currently  Lifestyle  . Physical activity    Days per week: 0 days    Minutes per session: 0 min  . Stress: Only a little  Relationships  . Social Herbalist on phone: Twice a week    Gets together: Twice a week    Attends religious service: Never    Active member of club or organization: No    Attends meetings of clubs or organizations: Never    Relationship status: Never married  Other Topics Concern  . Not on file  Social History Narrative   Social History      Diet?  Lots of fruits and veggies      Do you drink/eat things with caffeine? no      Marital status?                            single        What year were you married? 1960      Do you live in a house, apartment, assisted living, condo, trailer, etc.? apartment      Is it one or more stories? one      How many persons live in your home? one      Do you have any pets in your home? (please list) no      Highest level of education completed? masters      Current or past profession: publishing      Advanced Directives      Do you exercise?           yes                           Type & how often? Walking- daily      Do you have a living will? yes      Do you have a DNR form?                                  If not, do you want to discuss one? no      Do you have signed POA/HPOA for forms? yes      Functional Status      Do you have difficulty bathing or dressing yourself? no      Do you have difficulty preparing food or eating?  no      Do you have difficulty managing your medications? no      Do you have difficulty managing your finances? no      Do you have difficulty affording your medications? no    Outpatient Encounter Medications as of 11/27/2018  Medication Sig  . albuterol (ACCUNEB) 0.63 MG/3ML nebulizer solution Take 1 ampule by nebulization 2 (two) times daily as needed for wheezing or shortness of breath.  Marland Kitchen amLODipine (NORVASC) 2.5 MG tablet Take 1 tablet by mouth daily.  . Ascorbic Acid (VITAMIN C) 1000 MG tablet Take 1,000 mg by mouth daily.  Marland Kitchen aspirin 81 MG chewable tablet Chew 1 tablet by mouth daily.  . bisacodyl (DULCOLAX) 10 MG suppository Place 10 mg rectally as needed for moderate constipation.  . carbidopa-levodopa (SINEMET IR) 25-100 MG tablet Take 2 tablets by mouth 3 (three) times daily before meals.  . Cholecalciferol (  VITAMIN D3) 5000 units CAPS Take 1 capsule (5,000 Units total) by mouth daily.  Marland Kitchen denosumab (PROLIA) 60 MG/ML SOSY injection Inject  60 mg into the skin once for 1 dose.  . donepezil (ARICEPT) 5 MG tablet Take 1 tablet (5 mg total) by mouth at bedtime.  . Glucosamine-Chondroit-Vit C-Mn (GLUCOSAMINE 1500 COMPLEX PO) Take 1-2 tablets by mouth daily.  Marland Kitchen latanoprost (XALATAN) 0.005 % ophthalmic solution Place 1 drop into both eyes at bedtime.  . Lutein 6 MG CAPS Take 1 capsule by mouth daily.  . Multiple Vitamin (MULTIVITAMIN) tablet Take 1 tablet by mouth daily.  . Multiple Vitamins-Minerals (PRESERVISION AREDS 2+MULTI VIT) CAPS Take by mouth 2 (two) times daily.  . polyethylene glycol (MIRALAX / GLYCOLAX) packet Take 17 g by mouth daily as needed.  . pravastatin (PRAVACHOL) 40 MG tablet TAKE 1 TABLET ONCE DAILY.  Marland Kitchen senna-docusate (SENEXON-S) 8.6-50 MG tablet Take 1 tablet by mouth daily.   No facility-administered encounter medications on file as of 11/27/2018.     Activities of Daily Living In your present state of health, do you have any difficulty performing the following activities: 11/27/2018  Hearing? N  Vision? Y  Difficulty concentrating or making decisions? Y  Walking or climbing stairs? Y  Dressing or bathing? Y  Doing errands, shopping? Y  Preparing Food and eating ? Y  Using the Toilet? Y  In the past six months, have you accidently leaked urine? Y  Do you have problems with loss of bowel control? N  Managing your Medications? Y  Managing your Finances? Y  Housekeeping or managing your Housekeeping? Y  Some recent data might be hidden    Patient Care Team: Gayland Curry, DO as PCP - General (Geriatric Medicine) Jerline Pain, MD as PCP - Cardiology (Cardiology) Bjorn Loser, MD as Consulting Physician (Urology)    Assessment:   This is a routine wellness examination for Deni.  Exercise Activities and Dietary recommendations Current Exercise Habits: The patient does not participate in regular exercise at present, Exercise limited by: neurologic condition(s)  Goals    . DIET - EAT MORE  FRUITS AND VEGETABLES       Fall Risk Fall Risk  11/27/2018 11/05/2018 06/25/2018 04/18/2018 02/26/2018  Falls in the past year? 0 1 0 1 No  Number falls in past yr: 0 0 0 1 -  Comment - - - - -  Injury with Fall? 0 0 0 0 -  Risk Factor Category  - - - - -  Risk for fall due to : Impaired balance/gait - - - -  Follow up Falls evaluation completed - - - -   Is the patient's home free of loose throw rugs in walkways, pet beds, electrical cords, etc?   yes      Grab bars in the bathroom? yes      Handrails on the stairs?   yes      Adequate lighting?   yes  Timed Get Up and Go performed: not indicated  Depression Screen PHQ 2/9 Scores 11/27/2018 11/27/2018 11/05/2018 06/25/2018  PHQ - 2 Score 3 3 0 0  PHQ- 9 Score 6 6 - -     Cognitive Function MMSE - Mini Mental State Exam 01/15/2018 07/16/2017  Orientation to time 5 4  Orientation to Place 5 5  Registration 3 3  Attention/ Calculation 5 5  Recall 3 2  Language- name 2 objects 2 2  Language- repeat 1 1  Language- follow 3  step command 3 3  Language- read & follow direction 1 1  Write a sentence 1 1  Copy design 0 0  Total score 29 27        Immunization History  Administered Date(s) Administered  . Influenza, High Dose Seasonal PF 01/29/2016  . Influenza,inj,Quad PF,6+ Mos 02/27/2018  . Influenza-Unspecified 02/27/2017  . Pneumococcal Conjugate-13 07/16/2017  . Pneumococcal Polysaccharide-23 04/30/2014  . Tdap 10/01/2017  . Zoster Recombinat (Shingrix) 10/30/2017, 02/10/2018    Qualifies for Shingles Vaccine?up to date  Screening Tests Health Maintenance  Topic Date Due  . INFLUENZA VACCINE  11/29/2018  . TETANUS/TDAP  10/02/2027  . DEXA SCAN  Completed  . PNA vac Low Risk Adult  Completed    Cancer Screenings: Lung: Low Dose CT Chest recommended if Age 55-80 years, 30 pack-year currently smoking OR have quit w/in 15years. Patient does not qualify. Breast:  Up to date on Mammogram? No   Up to date of Bone  Density/Dexa? Yes Colorectal: aged out  Additional Screenings: not indicated: Hepatitis C Screening:      Plan:      I have personally reviewed and noted the following in the patient's chart:   . Medical and social history . Use of alcohol, tobacco or illicit drugs  . Current medications and supplements . Functional ability and status . Nutritional status . Physical activity . Advanced directives . List of other physicians . Hospitalizations, surgeries, and ER visits in previous 12 months . Vitals . Screenings to include cognitive, depression, and falls . Referrals and appointments  In addition, I have reviewed and discussed with patient certain preventive protocols, quality metrics, and best practice recommendations. A written personalized care plan for preventive services as well as general preventive health recommendations were provided to patient.     Royal Hawthorn, NP  11/27/2018

## 2018-12-01 DIAGNOSIS — Z961 Presence of intraocular lens: Secondary | ICD-10-CM | POA: Diagnosis not present

## 2018-12-01 DIAGNOSIS — H5213 Myopia, bilateral: Secondary | ICD-10-CM | POA: Diagnosis not present

## 2018-12-01 DIAGNOSIS — H401131 Primary open-angle glaucoma, bilateral, mild stage: Secondary | ICD-10-CM | POA: Diagnosis not present

## 2018-12-01 DIAGNOSIS — H353132 Nonexudative age-related macular degeneration, bilateral, intermediate dry stage: Secondary | ICD-10-CM | POA: Diagnosis not present

## 2018-12-10 DIAGNOSIS — N390 Urinary tract infection, site not specified: Secondary | ICD-10-CM | POA: Diagnosis not present

## 2018-12-10 DIAGNOSIS — D649 Anemia, unspecified: Secondary | ICD-10-CM | POA: Diagnosis not present

## 2018-12-10 DIAGNOSIS — F09 Unspecified mental disorder due to known physiological condition: Secondary | ICD-10-CM | POA: Diagnosis not present

## 2018-12-10 DIAGNOSIS — R319 Hematuria, unspecified: Secondary | ICD-10-CM | POA: Diagnosis not present

## 2018-12-10 DIAGNOSIS — I10 Essential (primary) hypertension: Secondary | ICD-10-CM | POA: Diagnosis not present

## 2018-12-10 DIAGNOSIS — H409 Unspecified glaucoma: Secondary | ICD-10-CM | POA: Diagnosis not present

## 2018-12-10 LAB — BASIC METABOLIC PANEL
BUN: 21 (ref 4–21)
Creatinine: 0.9 (ref 0.5–1.1)
Glucose: 81
Potassium: 4.3 (ref 3.4–5.3)
Sodium: 141 (ref 137–147)

## 2018-12-10 LAB — CBC AND DIFFERENTIAL
HCT: 36 (ref 36–46)
Hemoglobin: 12.1 (ref 12.0–16.0)
Platelets: 156 (ref 150–399)
WBC: 5.5

## 2018-12-11 ENCOUNTER — Encounter: Payer: Self-pay | Admitting: Internal Medicine

## 2018-12-23 DIAGNOSIS — L84 Corns and callosities: Secondary | ICD-10-CM | POA: Diagnosis not present

## 2018-12-23 DIAGNOSIS — L603 Nail dystrophy: Secondary | ICD-10-CM | POA: Diagnosis not present

## 2019-01-28 DIAGNOSIS — Z9189 Other specified personal risk factors, not elsewhere classified: Secondary | ICD-10-CM | POA: Diagnosis not present

## 2019-02-05 DIAGNOSIS — Z20828 Contact with and (suspected) exposure to other viral communicable diseases: Secondary | ICD-10-CM | POA: Diagnosis not present

## 2019-02-07 LAB — NOVEL CORONAVIRUS, NAA: SARS-CoV-2, NAA: NOT DETECTED

## 2019-02-11 DIAGNOSIS — Z9189 Other specified personal risk factors, not elsewhere classified: Secondary | ICD-10-CM | POA: Diagnosis not present

## 2019-02-17 DIAGNOSIS — Z9189 Other specified personal risk factors, not elsewhere classified: Secondary | ICD-10-CM | POA: Diagnosis not present

## 2019-02-18 ENCOUNTER — Encounter: Payer: Self-pay | Admitting: Internal Medicine

## 2019-02-23 DIAGNOSIS — I10 Essential (primary) hypertension: Secondary | ICD-10-CM | POA: Diagnosis not present

## 2019-02-23 DIAGNOSIS — I2510 Atherosclerotic heart disease of native coronary artery without angina pectoris: Secondary | ICD-10-CM | POA: Diagnosis not present

## 2019-02-23 DIAGNOSIS — N189 Chronic kidney disease, unspecified: Secondary | ICD-10-CM | POA: Diagnosis not present

## 2019-02-23 DIAGNOSIS — R7989 Other specified abnormal findings of blood chemistry: Secondary | ICD-10-CM | POA: Diagnosis not present

## 2019-02-23 DIAGNOSIS — D649 Anemia, unspecified: Secondary | ICD-10-CM | POA: Diagnosis not present

## 2019-02-23 DIAGNOSIS — E785 Hyperlipidemia, unspecified: Secondary | ICD-10-CM | POA: Diagnosis not present

## 2019-02-23 LAB — CBC: RBC: 4.11 (ref 3.87–5.11)

## 2019-02-23 LAB — BASIC METABOLIC PANEL
BUN: 19 (ref 4–21)
CO2: 29 — AB (ref 13–22)
Chloride: 105 (ref 99–108)
Creatinine: 0.8 (ref 0.5–1.1)
Glucose: 106
Potassium: 4.3 (ref 3.4–5.3)
Sodium: 143 (ref 137–147)

## 2019-02-23 LAB — COMPREHENSIVE METABOLIC PANEL
Albumin: 4.1 (ref 3.5–5.0)
Calcium: 9.1 (ref 8.7–10.7)
Globulin: 2.1

## 2019-02-23 LAB — CBC AND DIFFERENTIAL
HCT: 37 (ref 36–46)
Hemoglobin: 12.1 (ref 12.0–16.0)
Platelets: 174 (ref 150–399)
WBC: 4.7

## 2019-02-24 DIAGNOSIS — Z20828 Contact with and (suspected) exposure to other viral communicable diseases: Secondary | ICD-10-CM | POA: Diagnosis not present

## 2019-02-24 DIAGNOSIS — Z9189 Other specified personal risk factors, not elsewhere classified: Secondary | ICD-10-CM | POA: Diagnosis not present

## 2019-02-25 ENCOUNTER — Encounter: Payer: Self-pay | Admitting: Internal Medicine

## 2019-02-25 ENCOUNTER — Other Ambulatory Visit: Payer: Self-pay

## 2019-02-25 ENCOUNTER — Non-Acute Institutional Stay: Payer: Medicare Other | Admitting: Internal Medicine

## 2019-02-25 VITALS — BP 125/75 | HR 80 | Temp 96.6°F | Ht 67.0 in | Wt 165.8 lb

## 2019-02-25 DIAGNOSIS — K5900 Constipation, unspecified: Secondary | ICD-10-CM

## 2019-02-25 DIAGNOSIS — J479 Bronchiectasis, uncomplicated: Secondary | ICD-10-CM | POA: Diagnosis not present

## 2019-02-25 DIAGNOSIS — I252 Old myocardial infarction: Secondary | ICD-10-CM

## 2019-02-25 DIAGNOSIS — K592 Neurogenic bowel, not elsewhere classified: Secondary | ICD-10-CM

## 2019-02-25 DIAGNOSIS — N3281 Overactive bladder: Secondary | ICD-10-CM | POA: Diagnosis not present

## 2019-02-25 DIAGNOSIS — G20C Parkinsonism, unspecified: Secondary | ICD-10-CM

## 2019-02-25 DIAGNOSIS — G20 Parkinson's disease: Secondary | ICD-10-CM

## 2019-02-25 DIAGNOSIS — R0789 Other chest pain: Secondary | ICD-10-CM | POA: Diagnosis not present

## 2019-02-25 DIAGNOSIS — I2510 Atherosclerotic heart disease of native coronary artery without angina pectoris: Secondary | ICD-10-CM | POA: Diagnosis not present

## 2019-02-25 MED ORDER — ALBUTEROL SULFATE HFA 108 (90 BASE) MCG/ACT IN AERS: 2.0000 | INHALATION_SPRAY | Freq: Four times a day (QID) | RESPIRATORY_TRACT | 5 refills | Status: DC | PRN

## 2019-02-25 NOTE — Progress Notes (Signed)
Location:  Occupational psychologist of Service:  Clinic (12)  Provider: Dawnisha Marquina L. [name], D.O., C.M.D.  Code Status: DNR Goals of Care:  Advanced Directives 06/25/2018  Does Patient Have a Medical Advance Directive? Yes  Type of Paramedic of [place];Living will;Out of facility DNR (pink MOST or yellow form)  Does patient want to make changes to medical advance directive? No - Patient declined  Copy of [place] in Chart? Yes - validated most recent copy scanned in chart (See row information)  Pre-existing out of facility DNR order (yellow form or pink MOST form) Yellow form placed in chart (order not valid for inpatient use)     Chief Complaint  Patient presents with  . Medical Management of Chronic Issues    3 Month Follow up    HPI: Patient is a 85 y.o. female seen today for medical management of chronic diseases.    Kaitlyn Ortiz was upset about being in isolation--finds it particularly boring.  She likes to walk the halls more and cannot do that here lately,  She mentions no change with her eyes.  Has glaucoma.  Feels like her problems are mostly annoyances not serious things for the most part.  Tremors remain.  Uses her walker to ambulated.  Has had fall without major injury.  Denies pains.  She admits her bowels are not moving like she'd like, but I'd been asked about this and we just started miralax daily for her last week.  Nursing notes that pt does not recall when she's gone making it harder to keep track of benefit.  No abdominal pain or blood in stool.  She denies hard stools.  She also c/o her longstanding incontinence concerns, but says that the kegels work quite well for her and she does them sometimes preventing incontinence.  She mentions periods of chest tightness and wants something that gives her more immediate relief.  She's sure this is related to the lungs.  It does not feel like her prior heart  attack and she has no neck, jaw or arm discomfort with it.  She does feel dyspneic and it seems to be in more humid weather.  She does use her acapella device and is on her nebs bid which I reviewed with her are for her bronchiectasis and help to prevent pneumonia.    Her short-term memory did seem to be getting progressively worse based on this interaction today.    Past Medical History:  Diagnosis Date  . Bronchiectasis ([place])   . Cognitive changes    mild  . Glaucoma   . Heart attack ([place])    mild  . Hypertension   . Incontinence     Past Surgical History:  Procedure Laterality Date  . CATARACT EXTRACTION  1994  . CATARACT EXTRACTION  2006    Allergies  Allergen Reactions  . Caffeine     Outpatient Encounter Medications as of 02/25/2019  Medication Sig  . albuterol (ACCUNEB) 0.63 MG/3ML nebulizer solution Take 1 ampule by nebulization 2 (two) times daily as needed for wheezing or shortness of breath.  Marland Kitchen amLODipine (NORVASC) 2.5 MG tablet Take 1 tablet by mouth daily.  . Ascorbic Acid (VITAMIN C) 1000 MG tablet Take 1,000 mg by mouth daily.  Marland Kitchen aspirin 81 MG chewable tablet Chew 1 tablet by mouth daily.  . bisacodyl (DULCOLAX) 10 MG suppository Place 10 mg rectally as needed for moderate constipation.  . carbidopa-levodopa (SINEMET IR) 25-100  MG tablet Take 2 tablets by mouth 3 (three) times daily before meals.  . Cholecalciferol (VITAMIN D3) 5000 units CAPS Take 1 capsule (5,000 Units total) by mouth daily.  Marland Kitchen denosumab (PROLIA) 60 MG/ML SOSY injection Inject 60 mg into the skin once for 1 dose.  . donepezil (ARICEPT) 5 MG tablet Take 1 tablet (5 mg total) by mouth at bedtime.  . Glucosamine-Chondroit-Vit C-Mn (GLUCOSAMINE 1500 COMPLEX PO) Take 1-2 tablets by mouth daily.  Marland Kitchen latanoprost (XALATAN) 0.005 % ophthalmic solution Place 1 drop into both eyes at bedtime.  . Lutein 6 MG CAPS Take 1 capsule by mouth daily.  . Multiple Vitamin (MULTIVITAMIN) tablet Take 1 tablet by  mouth daily.  . Multiple Vitamins-Minerals (PRESERVISION AREDS 2+MULTI VIT) CAPS Take by mouth 2 (two) times daily.  . polyethylene glycol (MIRALAX / GLYCOLAX) packet Take 17 g by mouth daily as needed.  . pravastatin (PRAVACHOL) 40 MG tablet TAKE 1 TABLET ONCE DAILY.  Marland Kitchen senna-docusate (SENEXON-S) 8.6-50 MG tablet Take 1 tablet by mouth daily.   No facility-administered encounter medications on file as of 02/25/2019.     Review of Systems:  Review of Systems  Constitutional: Negative for chills, fever and malaise/fatigue.  HENT: Negative for congestion.   Eyes: Positive for blurred vision.  Respiratory: Positive for shortness of breath. Negative for cough, sputum production and wheezing.   Cardiovascular: Positive for leg swelling. Negative for chest pain, palpitations, orthopnea and PND.       Tightness  Gastrointestinal: Positive for constipation. Negative for abdominal pain, blood in stool, diarrhea and melena.  Genitourinary: Positive for frequency and urgency. Negative for dysuria, flank pain and hematuria.  Musculoskeletal: Positive for falls. Negative for joint pain.  Skin: Negative for rash.  Neurological: Positive for tremors. Negative for dizziness and loss of consciousness.       Some spasms of left leg seen today  Endo/Heme/Allergies: Bruises/bleeds easily.  Psychiatric/Behavioral: Positive for memory loss. Negative for depression.    Health Maintenance  Topic Date Due  . INFLUENZA VACCINE  11/29/2018  . TETANUS/TDAP  10/02/2027  . DEXA SCAN  Completed  . PNA vac Low Risk Adult  Completed    Physical Exam: Vitals:   02/25/19 0845  BP: 125/75  Pulse: 80  Temp: (!) 96.6 F (35.9 C)  TempSrc: Oral  SpO2: 97%  Weight: 165 lb 12.8 oz (75.2 kg)  Height: 5\' 7"  (1.702 m)   Body mass index is 25.97 kg/m. Physical Exam Vitals signs and nursing note reviewed.  Constitutional:      General: She is not in acute distress.    Appearance: Normal appearance. She is not  toxic-appearing.  HENT:     Head: Normocephalic and atraumatic.  Eyes:     Comments: Not wearing her glasses today for visit  Cardiovascular:     Rate and Rhythm: Normal rate and regular rhythm.  Pulmonary:     Effort: Pulmonary effort is normal.     Breath sounds: Normal breath sounds.     Comments: Chronic right base crackles Abdominal:     General: Bowel sounds are normal.  Musculoskeletal: Normal range of motion.     Comments: UE tremor, LLE spasms  Skin:    General: Skin is warm and dry.  Neurological:     General: No focal deficit present.     Mental Status: She is alert.     Cranial Nerves: No cranial nerve deficit.     Gait: Gait abnormal.  Psychiatric:  Mood and Affect: Mood normal.     Labs reviewed: Basic Metabolic Panel: Recent Labs    04/24/18 0700 12/10/18  NA 143 141  K 4.0 4.3  BUN 18 21  CREATININE 0.8 0.9   Liver Function Tests: No results for input(s): AST, ALT, ALKPHOS, BILITOT, PROT, ALBUMIN in the last 8760 hours. No results for input(s): LIPASE, AMYLASE in the last 8760 hours. No results for input(s): AMMONIA in the last 8760 hours. CBC: Recent Labs    04/24/18 0700 12/10/18  WBC 4.0 5.5  HGB 12.1 12.1  HCT 36 36  PLT 196 156   Lipid Panel: No results for input(s): CHOL, HDL, LDLCALC, TRIG, CHOLHDL, LDLDIRECT in the last 8760 hours. No results found for: HGBA1C  Assessment/Plan 1. Parkinsonism, unspecified Parkinsonism type ([place]) -felt to be multisystem atrophy and does fit well with this -cont sinemet which does make her feel better by her report today  2. Bronchiectasis without complication ([place]) -cont acapella, nebs bid, monitor  3. 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 Overactive bladder -cont kegels and adult briefs  5. Chest tightness -seems to be due to her bronchiectasis -will attempt an  albuterol inhaler with spacer 2 puffs q6h prn chest tightness -will see if she can do this -otherwise cont same txs for bronchiectasis above  Labs/tests ordered:  No new Next appt:  05/27/2019  Maidie Streight L. Ashlay Altieri, D.O. [facility] [address], [place] [address] Cell Phone (Mon-Fri 8am-5pm):  [phone] On Call:  [phone] & follow prompts after 5pm & weekends Office Phone:  [phone] Office Fax:  [phone]

## 2019-03-03 DIAGNOSIS — R918 Other nonspecific abnormal finding of lung field: Secondary | ICD-10-CM | POA: Diagnosis not present

## 2019-03-11 DIAGNOSIS — Z9189 Other specified personal risk factors, not elsewhere classified: Secondary | ICD-10-CM | POA: Diagnosis not present

## 2019-03-13 ENCOUNTER — Other Ambulatory Visit: Payer: Self-pay | Admitting: Thoracic Surgery (Cardiothoracic Vascular Surgery)

## 2019-03-13 DIAGNOSIS — I712 Thoracic aortic aneurysm, without rupture, unspecified: Secondary | ICD-10-CM

## 2019-03-17 DIAGNOSIS — Z9189 Other specified personal risk factors, not elsewhere classified: Secondary | ICD-10-CM | POA: Diagnosis not present

## 2019-03-25 DIAGNOSIS — Z9189 Other specified personal risk factors, not elsewhere classified: Secondary | ICD-10-CM | POA: Diagnosis not present

## 2019-03-25 DIAGNOSIS — Z20828 Contact with and (suspected) exposure to other viral communicable diseases: Secondary | ICD-10-CM | POA: Diagnosis not present

## 2019-04-01 ENCOUNTER — Encounter: Payer: Self-pay | Admitting: Neurology

## 2019-04-06 ENCOUNTER — Ambulatory Visit: Payer: Medicare Other | Admitting: Neurology

## 2019-04-08 ENCOUNTER — Telehealth (INDEPENDENT_AMBULATORY_CARE_PROVIDER_SITE_OTHER): Payer: Medicare Other | Admitting: Neurology

## 2019-04-08 ENCOUNTER — Encounter: Payer: Self-pay | Admitting: Neurology

## 2019-04-08 ENCOUNTER — Other Ambulatory Visit: Payer: Self-pay

## 2019-04-08 VITALS — Ht 67.5 in

## 2019-04-08 DIAGNOSIS — G20 Parkinson's disease: Secondary | ICD-10-CM | POA: Diagnosis not present

## 2019-04-08 DIAGNOSIS — G232 Striatonigral degeneration: Secondary | ICD-10-CM | POA: Diagnosis not present

## 2019-04-08 DIAGNOSIS — F02818 Dementia in other diseases classified elsewhere, unspecified severity, with other behavioral disturbance: Secondary | ICD-10-CM

## 2019-04-08 DIAGNOSIS — G20C Parkinsonism, unspecified: Secondary | ICD-10-CM

## 2019-04-08 DIAGNOSIS — F0281 Dementia in other diseases classified elsewhere with behavioral disturbance: Secondary | ICD-10-CM

## 2019-04-08 NOTE — Progress Notes (Signed)
Virtual Visit via Video Note The purpose of this virtual visit is to provide medical care while limiting exposure to the novel coronavirus.    Consent was obtained for video visit:  Yes.   Answered questions that patient had about telehealth interaction:  Yes.   I discussed the limitations, risks, security and privacy concerns of performing an evaluation and management service by telemedicine. I also discussed with the patient that there may be a patient responsible charge related to this service. The patient expressed understanding and agreed to proceed.  Pt location: Home Physician Location: office Name of referring provider:  Gayland Curry, DO I connected with Kaitlyn Ortiz at patients initiation/request on 04/08/2019 at  1:30 PM EST by video enabled telemedicine application and verified that I am speaking with the correct person using two identifiers. Pt MRN:  [id number] Pt DOB:  08/24/1931 Video Participants:  Kaitlyn Ortiz;  Edwena Felty Sales executive at [employer])   History of Present Illness:  The patient was seen as a virtual video visit on 04/08/2019. 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 She is accompanied by her full time nurse since April who helps supplement the history today. She states her main issue is urinary incontinence and constipation. She reports "jiggling feet" with a fall 4 months ago, she did not recall the fall, Edwena Felty had to remind her about it. She feels the Sinemet really helps her, she is on Sinemet 25/158m 2 tabs TID. She feels more steady 30 minutes after taking medication. No side effects on medication. She has dizziness immediately after standing, no falls. LEdwena Feltyreports major issues have been worsening cognition, she has difficulty remembering toileting steps, not recalling what to do next even with reminders and directions. She says she does not understand the process. She is on Donepezil 581mdaily. LoEdwena Feltyeports  visual hallucinations a few times a month. She states she sleeps "beautifully." LoEdwena Feltyeports she [place]. She ambulates with a walker.   History on Initial Assessment 01/07/2018: This is an [age]ear old right-handed woman with a history of hypertension, dementia, presenting for evaluation of concern for parkinsonism. According to 10/30/17 note, she gets lost in the facility easily, she was noted to have decreased coordination with toe taps on left with slowing versus right, fast tremor of left leg at rest, seems to be able to stop on her own, bilateral UE intention tremor and slight resting tremor left greater than right, does not have classic shuffling gait, ambulates with rollator walker.   She reports her memory is fine. Her daughter reports that [place] had [place] when she had an episode of delirium while traveling, wandering around the hotel in the middle of the night. She was found to have a UTI. She would occasionally mention driving to the supermarket and forgetting where to go. [facility] with a diagnosis of Mild Cognitive Impairment. She moved to [facility] in July 2018. She was noted to have spatial difficulties finding her table or the kitchen sink. At that time she was noted to have tremor and slightly masked facies. It was noted she met criteria for assisted living, but did not move until June 2019. She was having more balance issues and falls, and with a fall in June 2019, she was noted to be orthostatic, with improvement  on holding BP medications. She has been seeing Urology for overactive bladder and has had a bladder stimulator which she feels helps a little. Medications are administered by staff. She now needs help with bathing and dressing, figuring out where the top is or legs of pants are. Her daughter manages finances. Her  daughter notes that it is a big problem navigating her environment. She would sit and be unable to figure out how to do it, difficulty understanding how to get from standing to sitting on the toilet. She is taking Donepezil 62m daily without side effects.  She reports dizziness when supine, but mostly when she gets up for dinner. She feels dizziness lasts for several minutes, she would grap hold of something that would stop her from falling. No neck/back pain, focal numbness/tingling/weakness. She wears a brace on both knees. She has chronic constipation and reports that she would go once very 8-10 months, but now every 7-8 days. She reports blurred vision. No dysarthria/dysphagia. She has had decreased sense of smell for 10-15 years. She sleeps fine the first 5 hours, then wakes at midnight with light sleep, but feels refreshed in the morning. Her daughter notes she tends to nod off. She reports a left foot tremor that she can control when she thinks about it. She feels it moves when she crosses her leg, but is noted to have a resting tremor today. She has noticed her fingers tremble a little for the past year, eating and handwriting is messy over the past 3-5 years. It takes her more than 24 hours to write a card.   I personally reviewed MRI brain without contrast done 11/15/17 which did not show any acute changes, there was moderate diffuse volume loss, mild to moderate chronic microvascular disease.     Current Outpatient Medications on File Prior to Visit  Medication Sig Dispense Refill   albuterol (ACCUNEB) 0.63 MG/3ML nebulizer solution Take 1 ampule by nebulization 2 (two) times daily as needed for wheezing or shortness of breath.     albuterol (VENTOLIN HFA) 108 (90 Base) MCG/ACT inhaler Inhale 2 puffs into the lungs every 6 (six) hours as needed for wheezing or shortness of breath. Use spacer 6.7 g 5   amLODipine (NORVASC) 2.5 MG tablet Take 1 tablet by mouth daily.     Ascorbic Acid  (VITAMIN C) 1000 MG tablet Take 1,000 mg by mouth daily.     aspirin 81 MG chewable tablet Chew 1 tablet by mouth daily.     bisacodyl (DULCOLAX) 10 MG suppository Place 10 mg rectally as needed for moderate constipation.     carbidopa-levodopa (SINEMET IR) 25-100 MG tablet Take 2 tablets by mouth 3 (three) times daily before meals.     Cholecalciferol (VITAMIN D3) 5000 units CAPS Take 1 capsule (5,000 Units total) by mouth daily. 30 capsule 5   denosumab (PROLIA) 60 MG/ML SOSY injection Inject 60 mg into the skin once for 1 dose. 1 mL 0   donepezil (ARICEPT) 5 MG tablet Take 1 tablet (5 mg total) by mouth at bedtime. 90 tablet 1   Glucosamine-Chondroit-Vit C-Mn (GLUCOSAMINE 1500 COMPLEX PO) Take 1-2 tablets by mouth daily.     latanoprost (XALATAN) 0.005 % ophthalmic solution Place 1 drop into both eyes at bedtime.     Lutein 6 MG CAPS Take 1 capsule by mouth daily.     Multiple Vitamin (MULTIVITAMIN) tablet Take 1 tablet by mouth daily.     Multiple Vitamins-Minerals (PRESERVISION AREDS 2+MULTI VIT) CAPS Take  by mouth 2 (two) times daily.     polyethylene glycol (MIRALAX / GLYCOLAX) packet Take 17 g by mouth daily as needed.     pravastatin (PRAVACHOL) 40 MG tablet TAKE 1 TABLET ONCE DAILY. 90 tablet 0   senna-docusate (SENEXON-S) 8.6-50 MG tablet Take 1 tablet by mouth daily.     No current facility-administered medications on file prior to visit.     Observations/Objective:   Vitals:   04/08/19 1331  Height: 5' 7.5" (1.715 m)   GEN:  The patient appears stated age and is in NAD.  Neurological examination: Patient is awake, alert, oriented x 3. No aphasia or dysarthria. Intact fluency, able to follow commands. Remote and recent memory impaired. SLUMS score 15/30. Cranial nerves: Extraocular movements intact with no nystagmus. No facial asymmetry. Motor: moves all extremities symmetrically, at least anti-gravity x 4. No incoordination on finger to nose testing. Gait: slow  and cautious with walker. Tremor: she has minimal bilateral UE tremor, they report tremors are worse in the legs, unable to clearly visualize on video. Fair finger taps.  [employer] Mental Exam 04/08/2019  Weekday Correct 1  Current year 1  What state are we in? 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 She has had good response to Sinemet but continues to report legs are "jiggling." We will try slightly increasing Sinemet 25/174m to 2.5 tabs TID. She is on Donepezil for dementia with worsening cognition reported, SLUMS score today 15/30. [place], continue close supervision. Follow-up in 6 months, they know to call for any changes.    Follow Up Instructions:   -I discussed the assessment and treatment plan with the patient. The patient was provided an opportunity to ask questions and all were answered. The patient agreed with the plan and demonstrated an understanding of the instructions.   The patient was advised to call back or seek an in-person evaluation if the symptoms worsen or if the condition fails to improve as anticipated.    KCameron Sprang MD

## 2019-04-14 MED ORDER — CARBIDOPA-LEVODOPA 25-100 MG PO TABS: ORAL_TABLET | ORAL | 3 refills | Status: DC

## 2019-04-16 ENCOUNTER — Inpatient Hospital Stay: Admission: RE | Admit: 2019-04-16 | Payer: Medicare Other | Source: Ambulatory Visit

## 2019-04-21 ENCOUNTER — Ambulatory Visit: Payer: Medicare Other | Admitting: Thoracic Surgery (Cardiothoracic Vascular Surgery)

## 2019-05-04 DIAGNOSIS — Z9189 Other specified personal risk factors, not elsewhere classified: Secondary | ICD-10-CM | POA: Diagnosis not present

## 2019-05-04 DIAGNOSIS — Z20828 Contact with and (suspected) exposure to other viral communicable diseases: Secondary | ICD-10-CM | POA: Diagnosis not present

## 2019-05-07 ENCOUNTER — Other Ambulatory Visit: Payer: Self-pay | Admitting: Internal Medicine

## 2019-05-07 DIAGNOSIS — M810 Age-related osteoporosis without current pathological fracture: Secondary | ICD-10-CM

## 2019-05-07 MED ORDER — DENOSUMAB 60 MG/ML ~~LOC~~ SOSY: 60.0000 mg | PREFILLED_SYRINGE | Freq: Once | SUBCUTANEOUS | 0 refills | Status: AC

## 2019-05-07 NOTE — Progress Notes (Signed)
Patient is meant to get prolia injection 05/27/19.  We had been doing this in clinic, but now it must be given by AL nurse and obtained thru pharmacy according to [facility] administrator.

## 2019-05-11 DIAGNOSIS — Z9189 Other specified personal risk factors, not elsewhere classified: Secondary | ICD-10-CM | POA: Diagnosis not present

## 2019-05-11 DIAGNOSIS — Z20828 Contact with and (suspected) exposure to other viral communicable diseases: Secondary | ICD-10-CM | POA: Diagnosis not present

## 2019-05-12 DIAGNOSIS — Z23 Encounter for immunization: Secondary | ICD-10-CM | POA: Diagnosis not present

## 2019-05-14 ENCOUNTER — Other Ambulatory Visit: Payer: Self-pay | Admitting: Thoracic Surgery (Cardiothoracic Vascular Surgery)

## 2019-05-14 DIAGNOSIS — I712 Thoracic aortic aneurysm, without rupture, unspecified: Secondary | ICD-10-CM

## 2019-05-18 DIAGNOSIS — Z9189 Other specified personal risk factors, not elsewhere classified: Secondary | ICD-10-CM | POA: Diagnosis not present

## 2019-05-18 DIAGNOSIS — I712 Thoracic aortic aneurysm, without rupture: Secondary | ICD-10-CM | POA: Diagnosis not present

## 2019-05-18 DIAGNOSIS — Z20828 Contact with and (suspected) exposure to other viral communicable diseases: Secondary | ICD-10-CM | POA: Diagnosis not present

## 2019-05-18 LAB — BASIC METABOLIC PANEL
BUN: 15 (ref 4–21)
BUN: 15 (ref 4–21)
Creatinine: 0.8 (ref 0.5–1.1)
Creatinine: 0.8 (ref 0.5–1.1)

## 2019-05-19 ENCOUNTER — Ambulatory Visit (INDEPENDENT_AMBULATORY_CARE_PROVIDER_SITE_OTHER): Payer: Medicare Other | Admitting: Thoracic Surgery (Cardiothoracic Vascular Surgery)

## 2019-05-19 ENCOUNTER — Ambulatory Visit
Admission: RE | Admit: 2019-05-19 | Discharge: 2019-05-19 | Disposition: A | Discharge status: Home / Self Care | Payer: Medicare Other | Source: Ambulatory Visit | Attending: Thoracic Surgery (Cardiothoracic Vascular Surgery) | Admitting: Thoracic Surgery (Cardiothoracic Vascular Surgery)

## 2019-05-19 ENCOUNTER — Other Ambulatory Visit: Payer: Self-pay | Admitting: *Deleted

## 2019-05-19 ENCOUNTER — Encounter: Payer: Self-pay | Admitting: Thoracic Surgery (Cardiothoracic Vascular Surgery)

## 2019-05-19 ENCOUNTER — Other Ambulatory Visit: Payer: Self-pay

## 2019-05-19 VITALS — BP 151/85 | HR 55 | Temp 97.5°F | Resp 16 | Ht 67.5 in | Wt 168.1 lb

## 2019-05-19 DIAGNOSIS — I7121 Aneurysm of the ascending aorta, without rupture: Secondary | ICD-10-CM

## 2019-05-19 DIAGNOSIS — R918 Other nonspecific abnormal finding of lung field: Secondary | ICD-10-CM

## 2019-05-19 DIAGNOSIS — I712 Thoracic aortic aneurysm, without rupture, unspecified: Secondary | ICD-10-CM

## 2019-05-19 DIAGNOSIS — N2889 Other specified disorders of kidney and ureter: Secondary | ICD-10-CM

## 2019-05-19 MED ORDER — IOPAMIDOL (ISOVUE-370) INJECTION 76%
75.0000 mL | Freq: Once | INTRAVENOUS | Status: AC | PRN
  Administered 2019-05-19: 75 mL via INTRAVENOUS

## 2019-05-19 NOTE — Progress Notes (Signed)
[name]     HPI: [name] returns for follow-up of her ascending aneurysm  Kaitlyn Ortiz is an 86 year old woman with a past history of hypertension, coronary artery disease, MI, 4.5 cm ascending aneurysm, bronchiectasis, dementia, and glaucoma.  She was found to have an ascending aortic aneurysm in 2018.  She has been followed since that time.  [facility].  She continues to have issues with dementia.  She complains of occasional pressure in her chest.  It is not related to any exertion.  She says that it mostly is on cold rainy days.  She complained of feeling lightheaded when she got up this morning.  Past Medical History:  Diagnosis Date  . Bronchiectasis ([place])   . Cognitive changes    mild  . Glaucoma   . Heart attack ([place])    mild  . Hypertension   . Incontinence     Current Outpatient Medications  Medication Sig Dispense Refill  . albuterol (ACCUNEB) 0.63 MG/3ML nebulizer solution Take 1 ampule by nebulization 2 (two) times daily as needed for wheezing or shortness of breath.    Marland Kitchen albuterol (VENTOLIN HFA) 108 (90 Base) MCG/ACT inhaler Inhale 2 puffs into the lungs every 6 (six) hours as needed for wheezing or shortness of breath. Use spacer 6.7 g 5  . amLODipine (NORVASC) 2.5 MG tablet Take 1 tablet by mouth daily.    . Ascorbic Acid (VITAMIN C) 1000 MG tablet Take 1,000 mg by mouth daily.    Marland Kitchen aspirin 81 MG chewable tablet Chew 1 tablet by mouth daily.    . bisacodyl (DULCOLAX) 10 MG suppository Place 10 mg rectally as needed for moderate constipation.    . carbidopa-levodopa (SINEMET IR) 25-100 MG tablet Take 2.5 tabs TID with meals 675 tablet 3  . Cholecalciferol (VITAMIN D3) 5000 units CAPS Take 1 capsule (5,000 Units total) by mouth daily. 30 capsule 5  . donepezil (ARICEPT) 5 MG tablet Take 1 tablet (5 mg total) by mouth at bedtime. 90 tablet 1  . Glucosamine-Chondroit-Vit C-Mn  (GLUCOSAMINE 1500 COMPLEX PO) Take 1-2 tablets by mouth daily.    Marland Kitchen latanoprost (XALATAN) 0.005 % ophthalmic solution Place 1 drop into both eyes at bedtime.    . Lutein 6 MG CAPS Take 1 capsule by mouth daily.    . Multiple Vitamin (MULTIVITAMIN) tablet Take 1 tablet by mouth daily.    . Multiple Vitamins-Minerals (PRESERVISION AREDS 2+MULTI VIT) CAPS Take by mouth 2 (two) times daily.    . polyethylene glycol (MIRALAX / GLYCOLAX) packet Take 17 g by mouth daily as needed.    . pravastatin (PRAVACHOL) 40 MG tablet TAKE 1 TABLET ONCE DAILY. 90 tablet 0  . senna-docusate (SENEXON-S) 8.6-50 MG tablet Take 1 tablet by mouth daily.     No current facility-administered medications for this visit.    Physical Exam BP (!) 151/85 (BP Location: Right Arm, Patient Position: Sitting, Cuff Size: Normal)   Pulse (!) 55   Temp (!) 97.5 F (36.4 C)   Resp 16   Ht 5' 7.5" (1.715 m)   Wt 168 lb 2 oz (76.3 kg)   SpO2 96% Comment: RA  BMI 25.70 kg/m  86 year old woman in no acute distress Alert and oriented to person and place Cardiac regular rate and rhythm with no rubs or murmurs Lungs  clear bilaterally No carotid bruits  Diagnostic Tests: CT ANGIOGRAPHY CHEST WITH CONTRAST  TECHNIQUE: Multidetector CT imaging of the chest was performed using the standard protocol during bolus administration of intravenous contrast. Multiplanar CT image reconstructions and MIPs were obtained to evaluate the vascular anatomy.  CONTRAST:  88mL ISOVUE-370 IOPAMIDOL (ISOVUE-370) INJECTION 76%  COMPARISON:  10/03/2018 chest CT.  FINDINGS: Cardiovascular: Top-normal heart size. No significant pericardial effusion/thickening. Left anterior descending coronary atherosclerosis. Atherosclerotic thoracic aorta with 4.6 cm ascending thoracic aortic aneurysm, previously 4.6 cm on 10/03/2026 chest CT using similar measurement technique, unchanged. Aortic root diameter 3.6 cm at the level of the sinuses of  Valsalva. Aortic isthmus diameter 3.5 cm. Maximum descending thoracic aortic diameter 3.3 cm. Aortic diameter 2.5 cm at diaphragmatic hiatus. No thoracic aortic dissection, pseudoaneurysm or penetrating atherosclerotic ulcer. Stable dilated main pulmonary artery (3.5 cm diameter). No central pulmonary emboli.  Mediastinum/Nodes: Subcentimeter hypodense superior right thyroid nodule is stable and requires no follow-up. Unremarkable esophagus. No pathologically enlarged axillary, mediastinal or hilar lymph nodes.  Lungs/Pleura: No pneumothorax. No pleural effusion. Scattered mild fibronodular opacities at both lung apices are unchanged and compatible with mild postinfectious scarring. No acute consolidative airspace disease or lung masses. Mild cylindrical and varicoid bronchiectasis in inferior right middle lobe and basilar right lower lobe, unchanged. Associated mild patchy tree-in-bud opacities at the areas of bronchiectasis, mildly increased in the basilar right lower lobe. No new significant pulmonary nodules.  Upper abdomen: Partially visualized hypodense 2.4 cm renal cortical lesion in the interpolar lateral left kidney with density 58 HU (series 5/image 165). Partially visualized simple 5.5 cm interpolar left renal cyst.  Musculoskeletal: No aggressive appearing focal osseous lesions. Moderate thoracic spondylosis.  Review of the MIP images confirms the above findings.  IMPRESSION: 1. Stable 4.6 cm ascending thoracic aortic aneurysm using similar measurement technique. No acute aortic syndrome. 2. Stable dilated main pulmonary artery, suggesting chronic pulmonary arterial hypertension. 3. Stable mild bronchiectasis in the right middle and right lower lobes. Associated mild patchy tree-in-bud opacities at the areas of bronchiectasis, mildly increased at the right lung base, compatible with nonspecific mild defects or inflammatory bronchiolitis as can be seen with  recurrent aspiration or atypical mycobacterial infection (MAI). 4. Indeterminate partially visualized hypodense 2.4 cm left renal cortical lesion, renal cell carcinoma not excluded. Renal mass protocol MRI or CT abdomen without and with IV contrast may be performed as clinically warranted. 5. One vessel coronary atherosclerosis.  Aortic Atherosclerosis (ICD10-I70.0).   Electronically Signed   By: Ilona Sorrel M.D.   On: 05/19/2019 11:15 I personally reviewed the CT images and concur with the findings noted above  Impression: Kaitlyn Ortiz is an 86 year old woman with history of hypertension, coronary artery disease, MI, 4.5 cm ascending aneurysm, bronchiectasis, dementia, and glaucoma.  She has been followed for an ascending aneurysm for the past couple of years.  It remained stable at 4.6 cm.  She needs continued semiannual follow-up, although she is not a candidate for surgical repair.  Chest pain-she again has atypical chest pain.  This is different than the pain she complained about at her last visit.  She says it is typically related to cold rainy days although not exclusively at that time.  It typically only lasts a few seconds and is not exertional in nature.  Would not pursue further work-up unless it becomes more frequent or problematic.  Hypertension-blood pressure is elevated today.  No medication changes at this time.  Plan: MRI of the kidney with and  without IV contrast to evaluate 2.4 cm left renal cortical lesion Return in 6 months with CT angio of chest.  Melrose Nakayama, MD [facility] [phone]

## 2019-05-22 ENCOUNTER — Encounter: Payer: Self-pay | Admitting: Internal Medicine

## 2019-05-25 DIAGNOSIS — Z9189 Other specified personal risk factors, not elsewhere classified: Secondary | ICD-10-CM | POA: Diagnosis not present

## 2019-05-25 DIAGNOSIS — Z20828 Contact with and (suspected) exposure to other viral communicable diseases: Secondary | ICD-10-CM | POA: Diagnosis not present

## 2019-05-27 ENCOUNTER — Ambulatory Visit: Payer: Self-pay

## 2019-06-01 DIAGNOSIS — Z20828 Contact with and (suspected) exposure to other viral communicable diseases: Secondary | ICD-10-CM | POA: Diagnosis not present

## 2019-06-01 DIAGNOSIS — Z9189 Other specified personal risk factors, not elsewhere classified: Secondary | ICD-10-CM | POA: Diagnosis not present

## 2019-06-05 ENCOUNTER — Ambulatory Visit (HOSPITAL_COMMUNITY)
Admission: RE | Admit: 2019-06-05 | Discharge: 2019-06-05 | Disposition: A | Discharge status: Home / Self Care | Payer: Medicare Other | Source: Ambulatory Visit | Attending: Thoracic Surgery (Cardiothoracic Vascular Surgery) | Admitting: Thoracic Surgery (Cardiothoracic Vascular Surgery)

## 2019-06-05 ENCOUNTER — Other Ambulatory Visit: Payer: Self-pay

## 2019-06-05 DIAGNOSIS — N2889 Other specified disorders of kidney and ureter: Secondary | ICD-10-CM | POA: Insufficient documentation

## 2019-06-05 MED ORDER — GADOBUTROL 1 MMOL/ML IV SOLN
7.0000 mL | Freq: Once | INTRAVENOUS | Status: AC | PRN
  Administered 2019-06-05: 7 mL via INTRAVENOUS

## 2019-06-09 ENCOUNTER — Non-Acute Institutional Stay (SKILLED_NURSING_FACILITY): Payer: Medicare Other | Admitting: Internal Medicine

## 2019-06-09 ENCOUNTER — Encounter: Payer: Self-pay | Admitting: Internal Medicine

## 2019-06-09 DIAGNOSIS — M810 Age-related osteoporosis without current pathological fracture: Secondary | ICD-10-CM

## 2019-06-09 DIAGNOSIS — J479 Bronchiectasis, uncomplicated: Secondary | ICD-10-CM | POA: Diagnosis not present

## 2019-06-09 DIAGNOSIS — K5900 Constipation, unspecified: Secondary | ICD-10-CM

## 2019-06-09 DIAGNOSIS — N3281 Overactive bladder: Secondary | ICD-10-CM

## 2019-06-09 DIAGNOSIS — H409 Unspecified glaucoma: Secondary | ICD-10-CM

## 2019-06-09 DIAGNOSIS — K592 Neurogenic bowel, not elsewhere classified: Secondary | ICD-10-CM

## 2019-06-09 DIAGNOSIS — I872 Venous insufficiency (chronic) (peripheral): Secondary | ICD-10-CM

## 2019-06-09 DIAGNOSIS — I712 Thoracic aortic aneurysm, without rupture, unspecified: Secondary | ICD-10-CM

## 2019-06-09 DIAGNOSIS — G232 Striatonigral degeneration: Secondary | ICD-10-CM

## 2019-06-09 DIAGNOSIS — R296 Repeated falls: Secondary | ICD-10-CM

## 2019-06-09 DIAGNOSIS — Z23 Encounter for immunization: Secondary | ICD-10-CM | POA: Diagnosis not present

## 2019-06-09 NOTE — Progress Notes (Signed)
Patient ID: Kaitlyn Ortiz, female   DOB: November 24, 1931, 86 y.o.   MRN: [id number]  Provider:  Rexene Edison. [name], D.O., C.M.D. Location:  [facility] Room Number: 355 Place of Service:  SNF (31)(memory care)  PCP: Gayland Curry, DO Patient Care Team: Gayland Curry, DO as PCP - General (Geriatric Medicine) Jerline Pain, MD as PCP - Cardiology (Cardiology) Bjorn Loser, MD as Consulting Physician (Urology)  Extended Emergency Contact Information Primary Emergency Contact: [facility] Address: [address]          [place], [place] [address] [name] of [place] Phone: [phone] Work Phone: [phone] Mobile Phone: [phone] Relation: Daughter  Code Status: DNR in vynca Goals of Care: Advanced Directive information Advanced Directives 06/09/2019  Does Patient Have a Medical Advance Directive? Yes  Type of Advance Directive [place]  Does patient want to make changes to medical advance directive? No - Patient declined  Copy of [place] in Chart? -  Pre-existing out of facility DNR order (yellow form or pink MOST form) -      Chief Complaint  Patient presents with  . New Admit To SNF    New admission to Memory Care     HPI: Patient is a 86 y.o. female seen today for admission to memory care from AL to to progression of her dementia (felt to have multisystem atrophy variant of Parkinsonism which includes orthostatic hypotension) and increasing ADL needs.  She also has urinary incontinence, h/o falls, .  Nursing reports she appears to be doing well.  She has been trying to get to her apt by going to the exit so she's being redirected and her interpretation is that she's not allowed to cross any corridor to use the bathroom.  She was plotting with two other ladies how to "escape" and go back "home".  She denied physical complaints.  Her tremor more notable on her left persists.  She  remains confused.    Otherwise, medical history is relevant for bronchiectasis for which her pulmonologist recommended long-term nebulizer use to prevent pneumonias (when she stopped, she got pneumonia) so when she wants to quit the nebs, we need to remind her of this.  She has chronic constipation which was being managed adequately with her regimen as is.    She has an aneurysm for which she does not want intervention, but has been worried about and we've been screening for due to her anxiety about it.    She's had osteoporosis on bone density, but has opted not to take treatment.    Vision is poor which seems to some extent to be related to her MSA and some due to glaucoma.    She was quite depressed when she moved in and was on an antidepressant then, but is not at this point. Past Medical History:  Diagnosis Date  . Bronchiectasis ([place])   . Cognitive changes    mild  . Glaucoma   . Heart attack ([place])    mild  . Hypertension   . Incontinence    Past Surgical History:  Procedure Laterality Date  . CATARACT EXTRACTION  1994  . CATARACT EXTRACTION  2006    Social History   Socioeconomic History  . Marital status: Single    Spouse name: Not on file  . Number of children: Not on file  . Years of education: Not on file  . Highest education level: Not on file  Occupational History  .  Occupation: publishing  Tobacco Use  . Smoking status: Never Smoker  . Smokeless tobacco: Never Used  Substance and Sexual Activity  . Alcohol use: No  . Drug use: No  . Sexual activity: Not Currently  Other Topics Concern  . Not on file  Social History Narrative   Social History      Diet? Lots of fruits and veggies      Do you drink/eat things with caffeine? no      Marital status?                            single        What year were you married? 1960      Do you live in a house, apartment, assisted living, condo, trailer, etc.? apartment      Is it one or more stories? one       How many persons live in your home? one      Do you have any pets in your home? (please list) no      Highest level of education completed? masters      Current or past profession: publishing      Advanced Directives      Do you exercise?           yes                           Type & how often? Walking- daily      Do you have a living will? yes      Do you have a DNR form?                                  If not, do you want to discuss one? no      Do you have signed POA/HPOA for forms? yes      Functional Status      Do you have difficulty bathing or dressing yourself? no      Do you have difficulty preparing food or eating?  no      Do you have difficulty managing your medications? no      Do you have difficulty managing your finances? no      Do you have difficulty affording your medications? no   Social Determinants of Health   Financial Resource Strain:   . Difficulty of Paying Living Expenses: Not on file  Food Insecurity:   . Worried About Charity fundraiser in the Last Year: Not on file  . Ran Out of Food in the Last Year: Not on file  Transportation Needs:   . Lack of Transportation (Medical): Not on file  . Lack of Transportation (Non-Medical): Not on file  Physical Activity:   . Days of Exercise per Week: Not on file  . Minutes of Exercise per Session: Not on file  Stress:   . Feeling of Stress : Not on file  Social Connections:   . Frequency of Communication with Friends and Family: Not on file  . Frequency of Social Gatherings with Friends and Family: Not on file  . Attends Religious Services: Not on file  . Active Member of Clubs or Organizations: Not on file  . Attends Archivist Meetings: Not on file  . Marital Status: Not on file    reports that  she has never smoked. She has never used smokeless tobacco. She reports that she does not drink alcohol or use drugs.  Functional Status Survey:    Family History  Problem Relation  Age of Onset  . Stroke Father     Health Maintenance  Topic Date Due  . TETANUS/TDAP  10/02/2027  . INFLUENZA VACCINE  Completed  . DEXA SCAN  Completed  . PNA vac Low Risk Adult  Completed    Allergies  Allergen Reactions  . Caffeine     Outpatient Encounter Medications as of 06/09/2019  Medication Sig  . albuterol (ACCUNEB) 0.63 MG/3ML nebulizer solution Take 1 ampule by nebulization 2 (two) times daily.  Marland Kitchen amLODipine (NORVASC) 2.5 MG tablet Take 1 tablet by mouth daily.  . Ascorbic Acid (VITAMIN C) 1000 MG tablet Take 1,000 mg by mouth daily.  Marland Kitchen aspirin 81 MG chewable tablet Chew 1 tablet by mouth daily.  . bisacodyl (DULCOLAX) 10 MG suppository Place 10 mg rectally as needed for moderate constipation.  . carbidopa-levodopa (SINEMET IR) 25-100 MG tablet Take 2.5 tabs TID with meals  . Cholecalciferol (VITAMIN D3) 5000 units CAPS Take 1 capsule (5,000 Units total) by mouth daily.  Marland Kitchen donepezil (ARICEPT) 5 MG tablet Take 1 tablet (5 mg total) by mouth at bedtime.  . Glucosamine-Chondroit-Vit C-Mn (GLUCOSAMINE 1500 COMPLEX PO) Take 1-2 tablets by mouth daily.  . Lutein 6 MG CAPS Take 1 capsule by mouth daily.  . Multiple Vitamin (MULTIVITAMIN) tablet Take 1 tablet by mouth daily.  . Multiple Vitamins-Minerals (PRESERVISION AREDS 2+MULTI VIT) CAPS Take by mouth 2 (two) times daily.  . polyethylene glycol (MIRALAX / GLYCOLAX) packet Take 17 g by mouth daily as needed.  . pravastatin (PRAVACHOL) 40 MG tablet TAKE 1 TABLET ONCE DAILY.  Marland Kitchen senna-docusate (SENEXON-S) 8.6-50 MG tablet Take 1 tablet by mouth daily.  . [DISCONTINUED] albuterol (VENTOLIN HFA) 108 (90 Base) MCG/ACT inhaler Inhale 2 puffs into the lungs every 6 (six) hours as needed for wheezing or shortness of breath. Use spacer  . [DISCONTINUED] latanoprost (XALATAN) 0.005 % ophthalmic solution Place 1 drop into both eyes at bedtime.   No facility-administered encounter medications on file as of 06/09/2019.    Review of  Systems  Constitutional: Negative for chills, fever and malaise/fatigue.  HENT: Positive for hearing loss. Negative for congestion.   Eyes: Positive for blurred vision.  Respiratory: Negative for cough, shortness of breath and wheezing.   Cardiovascular: Negative for chest pain, palpitations, orthopnea, leg swelling and PND.  Gastrointestinal: Positive for constipation. Negative for abdominal pain, diarrhea, nausea and vomiting.  Genitourinary: Positive for urgency. Negative for dysuria.       Urinary incontinence  Musculoskeletal: Positive for falls.  Skin: Negative for itching and rash.  Neurological: Positive for tremors. Negative for dizziness and loss of consciousness.       Prior orthostatic hypotension  Psychiatric/Behavioral: Positive for depression and memory loss. The patient is not nervous/anxious and does not have insomnia.     Vitals:   06/09/19 0953  BP: 133/83  Pulse: 81  Temp: 97.8 F (36.6 C)  SpO2: 98%  Weight: 165 lb 6.4 oz (75 kg)  Height: 5' 7.5" (1.715 m)   Body mass index is 25.52 kg/m. Physical Exam Vitals reviewed.  Constitutional:      General: She is not in acute distress.    Appearance: Normal appearance. She is not toxic-appearing.  HENT:     Head: Normocephalic and atraumatic.     Right Ear:  External ear normal.     Left Ear: External ear normal.     Nose: Nose normal.     Mouth/Throat:     Pharynx: Oropharynx is clear.  Eyes:     Conjunctiva/sclera: Conjunctivae normal.     Pupils: Pupils are equal, round, and reactive to light.     Comments: glasses  Cardiovascular:     Rate and Rhythm: Normal rate and regular rhythm.     Pulses: Normal pulses.     Heart sounds: Normal heart sounds.  Pulmonary:     Effort: Pulmonary effort is normal.     Breath sounds: Rhonchi present.  Abdominal:     General: Bowel sounds are normal. There is no distension.     Palpations: Abdomen is soft.     Tenderness: There is no abdominal tenderness. There is  no guarding or rebound.  Musculoskeletal:        General: Normal range of motion.     Cervical back: Neck supple.     Right lower leg: No edema.     Left lower leg: No edema.     Comments: Tremor and occasional spasms of left leg  Skin:    General: Skin is warm and dry.     Coloration: Skin is pale.  Neurological:     Mental Status: She is alert. Mental status is at baseline.     Coordination: Coordination abnormal.     Gait: Gait abnormal.  Psychiatric:        Mood and Affect: Mood normal.     Comments: Chronic flat affect and somewhat negative about things since I've met her     Labs reviewed: Basic Metabolic Panel: Recent Labs    12/10/18 0000 05/18/19 0000  NA 141  --   K 4.3  --   BUN 21 15  CREATININE 0.9 0.8   Liver Function Tests: No results for input(s): AST, ALT, ALKPHOS, BILITOT, PROT, ALBUMIN in the last 8760 hours. No results for input(s): LIPASE, AMYLASE in the last 8760 hours. No results for input(s): AMMONIA in the last 8760 hours. CBC: Recent Labs    12/10/18 0000  WBC 5.5  HGB 12.1  HCT 36  PLT 156   Cardiac Enzymes: No results for input(s): CKTOTAL, CKMB, CKMBINDEX, TROPONINI in the last 8760 hours. BNP: Invalid input(s): POCBNP No results found for: HGBA1C Lab Results  Component Value Date   TSH 2.89 01/01/2017   Lab Results  Component Value Date   VITAMINB12 550 01/01/2017   No results found for: FOLATE No results found for: IRON, TIBC, FERRITIN  Imaging and Procedures obtained prior to SNF admission: MR ABDOMEN WWO CONTRAST  Result Date: 06/05/2019 CLINICAL DATA:  Left renal mass. EXAM: MRI ABDOMEN WITHOUT AND WITH CONTRAST TECHNIQUE: Multiplanar multisequence MR imaging of the abdomen was performed both before and after the administration of intravenous contrast. CONTRAST:  43m GADAVIST GADOBUTROL 1 MMOL/ML IV SOLN COMPARISON:  CTA of the abdomen of 05/19/2019. FINDINGS: Moderate motion degradation throughout. The pre and  postcontrast dynamics are severely motion degraded. Lower chest: Cardiomegaly, without pericardial or pleural effusion. Hepatobiliary: Hepatic cysts. Normal gallbladder, without biliary ductal dilatation. Pancreas:  Normal, without mass or ductal dilatation. Spleen:  Normal in size, without focal abnormality. Adrenals/Urinary Tract: Normal adrenal glands. Bilateral hepatic cysts, the largest of which is in the upper pole left kidney at 5.1 cm. Corresponding to the CT abnormality, within the lateral interpolar/lower pole left kidney is a 2.8 x 2.1 x 3.0 cm  mildly T2 hypointense lesion on 21/5 and 9/4. Demonstrates precontrast T1 hyperintensity, including on 39/900. After post-contrast, no evidence of enhancement, including on subtracted images. No hydronephrosis. Stomach/Bowel: Grossly normal stomach and abdominal bowel loops. Vascular/Lymphatic: Aortic atherosclerosis. No abdominal adenopathy. Other:  No ascites. Musculoskeletal: Convex left lumbar spine curvature. Probable hemangioma within the right-side of the T12 vertebral body. IMPRESSION: 1. Moderate motion degradation, as detailed above. 2. The left renal lesion is consistent with a hemorrhagic/proteinaceous cyst. 3.  Aortic Atherosclerosis (ICD10-I70.0). Electronically Signed   By: Abigail Miyamoto M.D.   On: 06/05/2019 14:39    Assessment/Plan 1. Multiple system atrophy with predominant parkinsonism ([place]) -it is suspected this is the variation of parkinson's she has   2. Bronchiectasis without complication ([place]) -continue ROUTINE albuterol nebs, stop inhaler which she's not able to use properly at this point  3. 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 Overactive bladder -was tried on myrbetriq but ineffective, also was seen by urology  5. Thoracic aortic aneurysm without rupture ([place]) -being monitored by vascular though patient has been clear she wants nothing done (yet she worries about  this and asks at appts)  6. Senile osteoporosis -has refused meds outside of Vitamin D3  7. Venous insufficiency -elevate feet at rest, recommend daily compression hose use  8. Recurrent falls -typically due to orthostatic hypotension -encourage hydration and compression hose use  9. Glaucoma, unspecified glaucoma type, unspecified laterality -poor vision, also has macular degeneration; on lutein and preservision areds2, but not on latanoprost drops any longer  Family/ staff Communication: discussed with memory care nurse  Labs/tests ordered:  No new ordered today  Lamiah Marmol L. Aamya Orellana, D.O. [facility] [address], [place] [address] Cell Phone (Mon-Fri 8am-5pm):  [phone] On Call:  [phone] & follow prompts after 5pm & weekends Office Phone:  [phone] Office Fax:  [phone]

## 2019-06-12 DIAGNOSIS — K5900 Constipation, unspecified: Secondary | ICD-10-CM | POA: Insufficient documentation

## 2019-06-12 DIAGNOSIS — K592 Neurogenic bowel, not elsewhere classified: Secondary | ICD-10-CM | POA: Insufficient documentation

## 2019-06-12 DIAGNOSIS — I872 Venous insufficiency (chronic) (peripheral): Secondary | ICD-10-CM | POA: Insufficient documentation

## 2019-06-12 DIAGNOSIS — M810 Age-related osteoporosis without current pathological fracture: Secondary | ICD-10-CM | POA: Insufficient documentation

## 2019-06-12 DIAGNOSIS — G232 Striatonigral degeneration: Secondary | ICD-10-CM | POA: Insufficient documentation

## 2019-06-24 ENCOUNTER — Encounter: Payer: Self-pay | Admitting: Internal Medicine

## 2019-06-30 DIAGNOSIS — Q6689 Other  specified congenital deformities of feet: Secondary | ICD-10-CM | POA: Diagnosis not present

## 2019-06-30 DIAGNOSIS — L602 Onychogryphosis: Secondary | ICD-10-CM | POA: Diagnosis not present

## 2019-07-09 ENCOUNTER — Encounter: Payer: Self-pay | Admitting: Adult Health

## 2019-07-09 ENCOUNTER — Non-Acute Institutional Stay (SKILLED_NURSING_FACILITY): Payer: Medicare Other | Admitting: Adult Health

## 2019-07-09 DIAGNOSIS — I2510 Atherosclerotic heart disease of native coronary artery without angina pectoris: Secondary | ICD-10-CM | POA: Diagnosis not present

## 2019-07-09 DIAGNOSIS — G232 Striatonigral degeneration: Secondary | ICD-10-CM | POA: Diagnosis not present

## 2019-07-09 DIAGNOSIS — J479 Bronchiectasis, uncomplicated: Secondary | ICD-10-CM | POA: Diagnosis not present

## 2019-07-09 DIAGNOSIS — E785 Hyperlipidemia, unspecified: Secondary | ICD-10-CM | POA: Diagnosis not present

## 2019-07-09 DIAGNOSIS — I10 Essential (primary) hypertension: Secondary | ICD-10-CM

## 2019-07-09 DIAGNOSIS — G903 Multi-system degeneration of the autonomic nervous system: Secondary | ICD-10-CM | POA: Diagnosis not present

## 2019-07-09 DIAGNOSIS — I252 Old myocardial infarction: Secondary | ICD-10-CM | POA: Diagnosis not present

## 2019-07-09 NOTE — Progress Notes (Signed)
Location:  Occupational psychologist of Service:  SNF (31) Provider:   Cindi Carbon, ANP [place] [phone]   Gayland Curry, DO  Patient Care Team: Gayland Curry, DO as PCP - General (Geriatric Medicine) Jerline Pain, MD as PCP - Cardiology (Cardiology) Bjorn Loser, MD as Consulting Physician (Urology)  Extended Emergency Contact Information Primary Emergency Contact: [facility] Address: [address]          [place], [place] [address] [name] of [place] Phone: [phone] Work Phone: [phone] Mobile Phone: [phone] Relation: Daughter  Code Status:  DNR Goals of care: Advanced Directive information Advanced Directives 06/09/2019  Does Patient Have a Medical Advance Directive? Yes  Type of Advance Directive [place]  Does patient want to make changes to medical advance directive? No - Patient declined  Copy of [place] in Chart? -  Pre-existing out of facility DNR order (yellow form or pink MOST form) -     Chief Complaint  Patient presents with  . Medical Management of Chronic Issues    HPI:  Pt is a 86 y.o. female seen today for medical management of chronic diseases.    She moved to memory care last month and seems to have adjusted well. The nurse reports that sometimes she has periods of paranoia and delusions but this is not new for her. She is thought to have [place] with parkinsonism. She is not having any issues with rigidity or gait at this time.  Her BP has ranged 130-150. She has a hx of orthostatic hypotension but has not had any new events.   Bronchiectasis: no cough, sputum production, sob are reported. Continues on scheduled albuterol    Past Medical History:  Diagnosis Date  . Bronchiectasis ([place])   . Cognitive changes    mild  . Glaucoma   . Heart attack ([employer])    mild  . Hypertension   . Incontinence    Past Surgical History:    Procedure Laterality Date  . CATARACT EXTRACTION  1994  . CATARACT EXTRACTION  2006    Allergies  Allergen Reactions  . Caffeine     Outpatient Encounter Medications as of 07/09/2019  Medication Sig  . albuterol (ACCUNEB) 0.63 MG/3ML nebulizer solution Take 1 ampule by nebulization 2 (two) times daily.  Marland Kitchen amLODipine (NORVASC) 2.5 MG tablet Take 1 tablet by mouth daily.  . Ascorbic Acid (VITAMIN C) 1000 MG tablet Take 1,000 mg by mouth daily.  Marland Kitchen aspirin 81 MG chewable tablet Chew 1 tablet by mouth daily.  . bisacodyl (DULCOLAX) 10 MG suppository Place 10 mg rectally as needed for moderate constipation.  . carbidopa-levodopa (SINEMET IR) 25-100 MG tablet Take 2.5 tabs TID with meals  . Cholecalciferol (VITAMIN D3) 5000 units CAPS Take 1 capsule (5,000 Units total) by mouth daily.  Marland Kitchen donepezil (ARICEPT) 5 MG tablet Take 1 tablet (5 mg total) by mouth at bedtime.  . Glucosamine-Chondroit-Vit C-Mn (GLUCOSAMINE 1500 COMPLEX PO) Take 1-2 tablets by mouth daily.  . Lutein 6 MG CAPS Take 1 capsule by mouth daily.  . Multiple Vitamin (MULTIVITAMIN) tablet Take 1 tablet by mouth daily.  . Multiple Vitamins-Minerals (PRESERVISION AREDS 2+MULTI VIT) CAPS Take by mouth 2 (two) times daily.  . polyethylene glycol (MIRALAX / GLYCOLAX) packet Take 17 g by mouth daily.   . pravastatin (PRAVACHOL) 40 MG tablet TAKE 1 TABLET ONCE DAILY.  Marland Kitchen senna-docusate (SENEXON-S) 8.6-50 MG tablet Take 1 tablet by  mouth daily.   No facility-administered encounter medications on file as of 07/09/2019.    Review of Systems  Constitutional: Negative for activity change, appetite change, chills, diaphoresis, fatigue, fever and unexpected weight change.  HENT: Negative for congestion.   Respiratory: Negative for cough, shortness of breath and wheezing.   Cardiovascular: Negative for chest pain, palpitations and leg swelling.  Gastrointestinal: Positive for constipation. Negative for abdominal distention, abdominal pain  and diarrhea.  Genitourinary: Negative for difficulty urinating and dysuria.  Musculoskeletal: Positive for arthralgias and gait problem. Negative for back pain, joint swelling and myalgias.  Neurological: Negative for dizziness, tremors, seizures, syncope, facial asymmetry, speech difficulty, weakness, light-headedness, numbness and headaches.  Psychiatric/Behavioral: Positive for confusion. Negative for agitation and behavioral problems.       Delusions    Immunization History  Administered Date(s) Administered  . Influenza, High Dose Seasonal PF 01/29/2016, 02/26/2019  . Influenza,inj,Quad PF,6+ Mos 02/27/2018  . Influenza-Unspecified 02/27/2017  . Pneumococcal Conjugate-13 07/16/2017  . Pneumococcal Polysaccharide-23 04/30/2014  . Tdap 10/01/2017  . Zoster Recombinat (Shingrix) 10/30/2017, 02/10/2018   Pertinent  Health Maintenance Due  Topic Date Due  . INFLUENZA VACCINE  Completed  . DEXA SCAN  Completed  . PNA vac Low Risk Adult  Completed   Fall Risk  04/08/2019 02/25/2019 11/27/2018 11/05/2018 06/25/2018  Falls in the past year? 1 1 0 1 0  Number falls in past yr: 1 1 0 0 0  Comment - - - - -  Injury with Fall? 0 1 0 0 0  Risk Factor Category  - - - - -  Risk for fall due to : Impaired balance/gait - Impaired balance/gait - -  Follow up Falls evaluation completed - Falls evaluation completed - -   Functional Status Survey:    There were no vitals filed for this visit. There is no height or weight on file to calculate BMI. Physical Exam Vitals and nursing note reviewed.  Constitutional:      General: She is not in acute distress.    Appearance: She is not diaphoretic.  HENT:     Head: Normocephalic and atraumatic.     Mouth/Throat:     Mouth: Mucous membranes are moist.     Pharynx: Oropharynx is clear. No oropharyngeal exudate.     Comments: Slight purple discoloration to the left side of her tongue Neck:     Vascular: No JVD.  Cardiovascular:     Rate and  Rhythm: Normal rate and regular rhythm.     Heart sounds: No murmur.  Pulmonary:     Effort: Pulmonary effort is normal. No respiratory distress.     Breath sounds: Normal breath sounds. No wheezing.  Abdominal:     General: Bowel sounds are normal. There is no distension.     Palpations: Abdomen is soft.  Musculoskeletal:     Right lower leg: No edema.     Left lower leg: No edema.  Skin:    General: Skin is warm and dry.  Neurological:     General: No focal deficit present.     Mental Status: She is alert. Mental status is at baseline.     Comments: Oriented to self. No tremor or rigidity.  Psychiatric:     Comments: Flat affect     Labs reviewed: Recent Labs    12/10/18 0000 02/23/19 0000 05/18/19 0000  NA 141 143  --   K 4.3 4.3  --   CL  --  105  --  CO2  --  29*  --   BUN 21 19 15  15   CREATININE 0.9 0.8 0.8  0.8  CALCIUM  --  9.1  --    Recent Labs    02/23/19 0000  ALBUMIN 4.1   Recent Labs    12/10/18 0000 02/23/19 0000  WBC 5.5 4.7  HGB 12.1 12.1  HCT 36 37  PLT 156 174   Lab Results  Component Value Date   TSH 2.89 01/01/2017   No results found for: HGBA1C No results found for: CHOL, HDL, LDLCALC, LDLDIRECT, TRIG, CHOLHDL  Significant Diagnostic Results in last 30 days:  No results found.  Assessment/Plan 1. Benign hypertension Controlled  Would not aggressively treat given her fall risk and hx of hypotension   2. Coronary artery disease with history of myocardial infarction without history of CABG Continue baby aspirin and Pravachol  3. Neurogenic orthostatic hypotension ([facility]) No new events. Continue to monitor and encourage hydration   4. Bronchiectasis without complication ([place]) Controlled. Continue Albuterol BID for prevention of exacerbation   5. Multiple system atrophy with predominant parkinsonism ([place]) Continue supportive care in the memory care unit, which she seems to be adjusting too well. Continue Sinemet 2.5 tabs  tid    6. Hyperlipidemia LDL goal <100 No recent LDL. Will check with routine labs next check. Given her advancing health issues may no longer benefit.   Family/ staff Communication: resident and staff   Labs/tests ordered:  NA

## 2019-07-12 ENCOUNTER — Other Ambulatory Visit: Payer: Self-pay

## 2019-07-12 ENCOUNTER — Emergency Department (HOSPITAL_COMMUNITY): Payer: Medicare Other

## 2019-07-12 ENCOUNTER — Encounter (HOSPITAL_COMMUNITY): Payer: Self-pay

## 2019-07-12 ENCOUNTER — Inpatient Hospital Stay (HOSPITAL_COMMUNITY)
Admission: EM | Admit: 2019-07-12 | Discharge: 2019-07-16 | DRG: 522 | Disposition: A | Discharge status: Skilled Nursing Facility | Payer: Medicare Other | Attending: Internal Medicine | Admitting: Internal Medicine

## 2019-07-12 DIAGNOSIS — S0990XA Unspecified injury of head, initial encounter: Secondary | ICD-10-CM | POA: Diagnosis not present

## 2019-07-12 DIAGNOSIS — R519 Headache, unspecified: Secondary | ICD-10-CM | POA: Diagnosis not present

## 2019-07-12 DIAGNOSIS — W1830XA Fall on same level, unspecified, initial encounter: Secondary | ICD-10-CM | POA: Diagnosis present

## 2019-07-12 DIAGNOSIS — M1712 Unilateral primary osteoarthritis, left knee: Secondary | ICD-10-CM | POA: Diagnosis not present

## 2019-07-12 DIAGNOSIS — S299XXA Unspecified injury of thorax, initial encounter: Secondary | ICD-10-CM | POA: Diagnosis not present

## 2019-07-12 DIAGNOSIS — F0280 Dementia in other diseases classified elsewhere without behavioral disturbance: Secondary | ICD-10-CM | POA: Diagnosis present

## 2019-07-12 DIAGNOSIS — Z7982 Long term (current) use of aspirin: Secondary | ICD-10-CM | POA: Diagnosis not present

## 2019-07-12 DIAGNOSIS — G3183 Dementia with Lewy bodies: Secondary | ICD-10-CM | POA: Diagnosis not present

## 2019-07-12 DIAGNOSIS — K5900 Constipation, unspecified: Secondary | ICD-10-CM | POA: Diagnosis not present

## 2019-07-12 DIAGNOSIS — Z20822 Contact with and (suspected) exposure to covid-19: Secondary | ICD-10-CM | POA: Diagnosis present

## 2019-07-12 DIAGNOSIS — S72002A Fracture of unspecified part of neck of left femur, initial encounter for closed fracture: Secondary | ICD-10-CM | POA: Diagnosis not present

## 2019-07-12 DIAGNOSIS — Y92129 Unspecified place in nursing home as the place of occurrence of the external cause: Secondary | ICD-10-CM

## 2019-07-12 DIAGNOSIS — Z419 Encounter for procedure for purposes other than remedying health state, unspecified: Secondary | ICD-10-CM

## 2019-07-12 DIAGNOSIS — G20 Parkinson's disease: Secondary | ICD-10-CM | POA: Diagnosis not present

## 2019-07-12 DIAGNOSIS — E785 Hyperlipidemia, unspecified: Secondary | ICD-10-CM | POA: Diagnosis present

## 2019-07-12 DIAGNOSIS — Z66 Do not resuscitate: Secondary | ICD-10-CM | POA: Diagnosis not present

## 2019-07-12 DIAGNOSIS — F329 Major depressive disorder, single episode, unspecified: Secondary | ICD-10-CM | POA: Diagnosis present

## 2019-07-12 DIAGNOSIS — F0150 Vascular dementia without behavioral disturbance: Secondary | ICD-10-CM | POA: Diagnosis not present

## 2019-07-12 DIAGNOSIS — S72012A Unspecified intracapsular fracture of left femur, initial encounter for closed fracture: Secondary | ICD-10-CM | POA: Diagnosis present

## 2019-07-12 DIAGNOSIS — R739 Hyperglycemia, unspecified: Secondary | ICD-10-CM | POA: Diagnosis present

## 2019-07-12 DIAGNOSIS — R339 Retention of urine, unspecified: Secondary | ICD-10-CM | POA: Diagnosis present

## 2019-07-12 DIAGNOSIS — M80052A Age-related osteoporosis with current pathological fracture, left femur, initial encounter for fracture: Principal | ICD-10-CM | POA: Diagnosis present

## 2019-07-12 DIAGNOSIS — I252 Old myocardial infarction: Secondary | ICD-10-CM

## 2019-07-12 DIAGNOSIS — Z96642 Presence of left artificial hip joint: Secondary | ICD-10-CM | POA: Diagnosis not present

## 2019-07-12 DIAGNOSIS — M8580 Other specified disorders of bone density and structure, unspecified site: Secondary | ICD-10-CM | POA: Diagnosis not present

## 2019-07-12 DIAGNOSIS — M25562 Pain in left knee: Secondary | ICD-10-CM | POA: Diagnosis not present

## 2019-07-12 DIAGNOSIS — M2550 Pain in unspecified joint: Secondary | ICD-10-CM | POA: Diagnosis not present

## 2019-07-12 DIAGNOSIS — Z91048 Other nonmedicinal substance allergy status: Secondary | ICD-10-CM

## 2019-07-12 DIAGNOSIS — Z823 Family history of stroke: Secondary | ICD-10-CM

## 2019-07-12 DIAGNOSIS — Z471 Aftercare following joint replacement surgery: Secondary | ICD-10-CM | POA: Diagnosis not present

## 2019-07-12 DIAGNOSIS — R404 Transient alteration of awareness: Secondary | ICD-10-CM | POA: Diagnosis not present

## 2019-07-12 DIAGNOSIS — I10 Essential (primary) hypertension: Secondary | ICD-10-CM | POA: Diagnosis present

## 2019-07-12 DIAGNOSIS — Z03818 Encounter for observation for suspected exposure to other biological agents ruled out: Secondary | ICD-10-CM | POA: Diagnosis not present

## 2019-07-12 DIAGNOSIS — F0390 Unspecified dementia without behavioral disturbance: Secondary | ICD-10-CM | POA: Diagnosis present

## 2019-07-12 DIAGNOSIS — Z7401 Bed confinement status: Secondary | ICD-10-CM | POA: Diagnosis not present

## 2019-07-12 DIAGNOSIS — I2510 Atherosclerotic heart disease of native coronary artery without angina pectoris: Secondary | ICD-10-CM | POA: Diagnosis not present

## 2019-07-12 DIAGNOSIS — R32 Unspecified urinary incontinence: Secondary | ICD-10-CM | POA: Diagnosis present

## 2019-07-12 DIAGNOSIS — H409 Unspecified glaucoma: Secondary | ICD-10-CM | POA: Diagnosis not present

## 2019-07-12 DIAGNOSIS — R296 Repeated falls: Secondary | ICD-10-CM | POA: Diagnosis not present

## 2019-07-12 DIAGNOSIS — S72009A Fracture of unspecified part of neck of unspecified femur, initial encounter for closed fracture: Secondary | ICD-10-CM | POA: Diagnosis present

## 2019-07-12 DIAGNOSIS — I712 Thoracic aortic aneurysm, without rupture, unspecified: Secondary | ICD-10-CM | POA: Diagnosis present

## 2019-07-12 DIAGNOSIS — S72042A Displaced fracture of base of neck of left femur, initial encounter for closed fracture: Secondary | ICD-10-CM | POA: Diagnosis not present

## 2019-07-12 DIAGNOSIS — W19XXXA Unspecified fall, initial encounter: Secondary | ICD-10-CM | POA: Diagnosis not present

## 2019-07-12 DIAGNOSIS — M79652 Pain in left thigh: Secondary | ICD-10-CM | POA: Diagnosis not present

## 2019-07-12 DIAGNOSIS — J479 Bronchiectasis, uncomplicated: Secondary | ICD-10-CM | POA: Diagnosis not present

## 2019-07-12 DIAGNOSIS — M79672 Pain in left foot: Secondary | ICD-10-CM | POA: Diagnosis not present

## 2019-07-12 DIAGNOSIS — S199XXA Unspecified injury of neck, initial encounter: Secondary | ICD-10-CM | POA: Diagnosis not present

## 2019-07-12 DIAGNOSIS — M25552 Pain in left hip: Secondary | ICD-10-CM | POA: Diagnosis not present

## 2019-07-12 DIAGNOSIS — N281 Cyst of kidney, acquired: Secondary | ICD-10-CM | POA: Diagnosis not present

## 2019-07-12 DIAGNOSIS — R0902 Hypoxemia: Secondary | ICD-10-CM | POA: Diagnosis not present

## 2019-07-12 DIAGNOSIS — Z79899 Other long term (current) drug therapy: Secondary | ICD-10-CM

## 2019-07-12 DIAGNOSIS — R5381 Other malaise: Secondary | ICD-10-CM | POA: Diagnosis not present

## 2019-07-12 DIAGNOSIS — M79662 Pain in left lower leg: Secondary | ICD-10-CM | POA: Diagnosis not present

## 2019-07-12 DIAGNOSIS — Z9109 Other allergy status, other than to drugs and biological substances: Secondary | ICD-10-CM | POA: Diagnosis not present

## 2019-07-12 DIAGNOSIS — M25572 Pain in left ankle and joints of left foot: Secondary | ICD-10-CM | POA: Diagnosis not present

## 2019-07-12 DIAGNOSIS — D649 Anemia, unspecified: Secondary | ICD-10-CM | POA: Diagnosis present

## 2019-07-12 DIAGNOSIS — R338 Other retention of urine: Secondary | ICD-10-CM | POA: Diagnosis not present

## 2019-07-12 DIAGNOSIS — M542 Cervicalgia: Secondary | ICD-10-CM | POA: Diagnosis not present

## 2019-07-12 HISTORY — DX: Thoracic aortic aneurysm, without rupture: I71.2

## 2019-07-12 HISTORY — DX: Dissection of thoracic aorta, unspecified: I71.019

## 2019-07-12 LAB — COMPREHENSIVE METABOLIC PANEL
ALT: 18 U/L (ref 0–44)
AST: 34 U/L (ref 15–41)
Albumin: 4.3 g/dL (ref 3.5–5.0)
Alkaline Phosphatase: 51 U/L (ref 38–126)
Anion gap: 8 (ref 5–15)
BUN: 23 mg/dL (ref 8–23)
CO2: 27 mmol/L (ref 22–32)
Calcium: 9.1 mg/dL (ref 8.9–10.3)
Chloride: 107 mmol/L (ref 98–111)
Creatinine, Ser: 0.91 mg/dL (ref 0.44–1.00)
GFR calc Af Amer: >60 mL/min (ref >60)
GFR calc non Af Amer: 57 mL/min — ABNORMAL LOW (ref >60)
Glucose, Bld: 141 mg/dL — ABNORMAL HIGH (ref 70–99)
Potassium: 3.6 mmol/L (ref 3.5–5.1)
Sodium: 142 mmol/L (ref 135–145)
Total Bilirubin: 0.9 mg/dL (ref 0.3–1.2)
Total Protein: 7.5 g/dL (ref 6.5–8.1)

## 2019-07-12 LAB — CBC WITH DIFFERENTIAL/PLATELET
Abs Immature Granulocytes: 0.06 10*3/uL (ref 0.00–0.07)
Basophils Absolute: 0 10*3/uL (ref 0.0–0.1)
Basophils Relative: 0 %
Eosinophils Absolute: 0 10*3/uL (ref 0.0–0.5)
Eosinophils Relative: 0 %
HCT: 42.3 % (ref 36.0–46.0)
Hemoglobin: 13.6 g/dL (ref 12.0–15.0)
Immature Granulocytes: 1 %
Lymphocytes Relative: 5 %
Lymphs Abs: 0.5 10*3/uL — ABNORMAL LOW (ref 0.7–4.0)
MCH: 29.7 pg (ref 26.0–34.0)
MCHC: 32.2 g/dL (ref 30.0–36.0)
MCV: 92.4 fL (ref 80.0–100.0)
Monocytes Absolute: 0.6 10*3/uL (ref 0.1–1.0)
Monocytes Relative: 6 %
Neutro Abs: 9 10*3/uL — ABNORMAL HIGH (ref 1.7–7.7)
Neutrophils Relative %: 88 %
Platelets: 180 10*3/uL (ref 150–400)
RBC: 4.58 MIL/uL (ref 3.87–5.11)
RDW: 15.4 % (ref 11.5–15.5)
WBC: 10.3 10*3/uL (ref 4.0–10.5)
nRBC: 0 % (ref 0.0–0.2)

## 2019-07-12 LAB — URINALYSIS, ROUTINE W REFLEX MICROSCOPIC
Glucose, UA: NEGATIVE mg/dL
Nitrite: POSITIVE — AB
Protein, ur: 100 mg/dL — AB
Specific Gravity, Urine: 1.015 (ref 1.005–1.030)
pH: 8.5 — ABNORMAL HIGH (ref 5.0–8.0)

## 2019-07-12 LAB — TYPE AND SCREEN
ABO/RH(D): O NEG
Antibody Screen: NEGATIVE

## 2019-07-12 LAB — PROTIME-INR
INR: 1 (ref 0.8–1.2)
Prothrombin Time: 12.9 seconds (ref 11.4–15.2)

## 2019-07-12 LAB — ABO/RH: ABO/RH(D): O NEG

## 2019-07-12 LAB — URINALYSIS, MICROSCOPIC (REFLEX): Squamous Epithelial / HPF: NONE SEEN (ref 0–5)

## 2019-07-12 MED ORDER — GLUCOSAMINE 1500 COMPLEX PO CAPS: ORAL_CAPSULE | Freq: Every day | ORAL | Status: DC

## 2019-07-12 MED ORDER — CARBIDOPA-LEVODOPA 25-100 MG PO TABS
2.5000 | ORAL_TABLET | Freq: Three times a day (TID) | ORAL | Status: DC
  Administered 2019-07-13 – 2019-07-16 (×10): 2.5 via ORAL
  Filled 2019-07-12 (×10): qty 3

## 2019-07-12 MED ORDER — BISACODYL 10 MG RE SUPP: 10.0000 mg | RECTAL | Status: DC | PRN

## 2019-07-12 MED ORDER — ASCORBIC ACID 500 MG PO TABS
1000.0000 mg | ORAL_TABLET | Freq: Every day | ORAL | Status: DC
  Administered 2019-07-13: 1000 mg via ORAL
  Administered 2019-07-14: 500 mg via ORAL
  Administered 2019-07-15 – 2019-07-16 (×2): 1000 mg via ORAL
  Filled 2019-07-12 (×4): qty 2

## 2019-07-12 MED ORDER — ACETAMINOPHEN 325 MG PO TABS
650.0000 mg | ORAL_TABLET | Freq: Four times a day (QID) | ORAL | Status: DC | PRN
  Administered 2019-07-14: 325 mg via ORAL
  Administered 2019-07-16: 650 mg via ORAL
  Filled 2019-07-12 (×2): qty 2

## 2019-07-12 MED ORDER — POLYETHYLENE GLYCOL 3350 17 G PO PACK
17.0000 g | PACK | Freq: Every day | ORAL | Status: DC
  Administered 2019-07-14 – 2019-07-16 (×2): 17 g via ORAL
  Filled 2019-07-12 (×3): qty 1

## 2019-07-12 MED ORDER — FENTANYL CITRATE (PF) 100 MCG/2ML IJ SOLN
25.0000 ug | Freq: Once | INTRAMUSCULAR | Status: AC
  Administered 2019-07-12: 25 ug via INTRAVENOUS
  Filled 2019-07-12: qty 2

## 2019-07-12 MED ORDER — ACETAMINOPHEN 650 MG RE SUPP: 650.0000 mg | Freq: Four times a day (QID) | RECTAL | Status: DC | PRN

## 2019-07-12 MED ORDER — PRAVASTATIN SODIUM 40 MG PO TABS
40.0000 mg | ORAL_TABLET | Freq: Every day | ORAL | Status: DC
  Administered 2019-07-13: 40 mg via ORAL
  Filled 2019-07-12: qty 1

## 2019-07-12 MED ORDER — DONEPEZIL HCL 5 MG PO TABS
5.0000 mg | ORAL_TABLET | Freq: Every day | ORAL | Status: DC
  Administered 2019-07-13: 5 mg via ORAL
  Filled 2019-07-12: qty 1

## 2019-07-12 MED ORDER — MORPHINE SULFATE (PF) 4 MG/ML IV SOLN: 4.0000 mg | INTRAVENOUS | Status: DC | PRN

## 2019-07-12 MED ORDER — HEPARIN SODIUM (PORCINE) 5000 UNIT/ML IJ SOLN
5000.0000 [IU] | Freq: Three times a day (TID) | INTRAMUSCULAR | Status: DC
  Administered 2019-07-13: 5000 [IU] via SUBCUTANEOUS
  Filled 2019-07-12: qty 1

## 2019-07-12 MED ORDER — SODIUM CHLORIDE 0.9 % IV SOLN: INTRAVENOUS | Status: AC

## 2019-07-12 MED ORDER — AMLODIPINE BESYLATE 2.5 MG PO TABS
2.5000 mg | ORAL_TABLET | Freq: Every day | ORAL | Status: DC
  Administered 2019-07-13 – 2019-07-16 (×4): 2.5 mg via ORAL
  Filled 2019-07-12 (×4): qty 1

## 2019-07-12 MED ORDER — LUTEIN 6 MG PO CAPS: 1.0000 | ORAL_CAPSULE | Freq: Every day | ORAL | Status: DC

## 2019-07-12 MED ORDER — VITAMIN D 25 MCG (1000 UNIT) PO TABS
5000.0000 [IU] | ORAL_TABLET | Freq: Every day | ORAL | Status: DC
  Administered 2019-07-13: 5000 [IU] via ORAL
  Administered 2019-07-14: 4000 [IU] via ORAL
  Administered 2019-07-15 – 2019-07-16 (×2): 5000 [IU] via ORAL
  Filled 2019-07-12 (×4): qty 5

## 2019-07-12 MED ORDER — PROSIGHT PO TABS
1.0000 | ORAL_TABLET | Freq: Two times a day (BID) | ORAL | Status: DC
  Administered 2019-07-13: 1 via ORAL
  Filled 2019-07-12: qty 1

## 2019-07-12 NOTE — ED Provider Notes (Signed)
[facility] DEPT Provider Note   CSN: [id number] Arrival date & time: 07/12/19  1625     History No chief complaint on file.   Shaylah Lindau is a 86 y.o. female.  The history is provided by the patient, the nursing home, a relative, the EMS personnel and medical records. No language interpreter was used.   Mirelys Guthrie is a 86 y.o. female who presents to the Emergency Department complaining of fall.  Level V caveat due to dementia.  Hx is provided by EMS.  She presents to the ED from [facility] from evaluation of injuries following a fall.  She had an unwittnessed fall last night.  Today mobile xrays were taken of her left ankle, femur, foot, hip, knee and tib fib.  Images were significant for a left femoral neck fracture.  She is on asa 81mg  daily    Past Medical History:  Diagnosis Date  . Bronchiectasis ([place])   . Cognitive changes    mild  . Dissecting aortic aneurysm (any part), thoracic ([facility])    couple of years ago per daughter.  . Glaucoma   . Heart attack ([place])    mild  . Hypertension   . Incontinence     Patient Active Problem List   Diagnosis Date Noted  . Hip fracture ([place]) 07/12/2019  . Fall   . Left displaced femoral neck fracture ([place])   . 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 Constipation due to neurogenic bowel 06/12/2019  . Senile osteoporosis 06/12/2019  . Venous insufficiency 06/12/2019  . Depression, major, single episode, moderate ([place]) 11/05/2018  . Paranoid delusion ([place]) 11/05/2018  . 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 Osteopenia 02/26/2018  . Normocytic normochromic anemia 11/15/2017  . Overactive bladder 11/04/2017  . Parkinsonism ([place]) 11/04/2017  . Recurrent falls 11/04/2017  . Left lateral knee pain 08/07/2017  . Dilated aortic root ([place]) 05/02/2017  . Benign hypertension   . Heart attack ([place])   . Dementia without behavioral disturbance ([place])  04/10/2017  . Pain of toe 04/10/2017  . Essential hypertension 12/26/2016  . Bronchiectasis ([place]) 12/26/2016  . Glaucoma 12/26/2016  . Urinary incontinence 12/26/2016  . Coronary artery disease with history of myocardial infarction without history of CABG 09/18/2016  . Thoracic aortic aneurysm without rupture ([place]) 08/28/2016  . Hyperlipidemia LDL goal <100 08/16/2016    Past Surgical History:  Procedure Laterality Date  . CATARACT EXTRACTION  1994  . CATARACT EXTRACTION  2006     OB History   No obstetric history on file.     Family History  Problem Relation Age of Onset  . Stroke Father     Social History   Tobacco Use  . Smoking status: Never Smoker  . Smokeless tobacco: Never Used  Substance Use Topics  . Alcohol use: No  . Drug use: No    Home Medications Prior to Admission medications   Medication Sig Start Date End Date Taking? Authorizing Provider  albuterol (ACCUNEB) 0.63 MG/3ML nebulizer solution Take 1 ampule by nebulization 2 (two) times daily.   Yes [provider]  amLODipine (NORVASC) 2.5 MG tablet Take 1 tablet by mouth daily. 06/02/18  Yes [provider]  Ascorbic Acid (VITAMIN C) 1000 MG tablet Take 1,000 mg by mouth daily.   Yes [provider]  aspirin 81 MG chewable tablet Chew 1 tablet by mouth daily.   Yes [provider]  bisacodyl (DULCOLAX) 10 MG suppository Place 10  mg rectally as needed for moderate constipation.   Yes [provider]  carbidopa-levodopa (SINEMET IR) 25-100 MG tablet Take 2.5 tabs TID with meals 04/14/19  Yes Cameron Sprang, MD  Cholecalciferol (VITAMIN D3) 5000 units CAPS Take 1 capsule (5,000 Units total) by mouth daily. 12/26/16  Yes Reed, Tiffany L, DO  donepezil (ARICEPT) 5 MG tablet Take 1 tablet (5 mg total) by mouth at bedtime. 08/21/17  Yes Reed, Tiffany L, DO  Glucosamine-Chondroit-Vit C-Mn (GLUCOSAMINE 1500 COMPLEX PO) Take 1 tablet by mouth in the morning and at bedtime.     Yes [provider]  latanoprost (XALATAN) 0.005 % ophthalmic solution 1 drop at bedtime.   Yes [provider]  Lutein 20 MG TABS Take 20 mg by mouth at bedtime.   Yes [provider]  Multiple Vitamin (MULTIVITAMIN) tablet Take 1 tablet by mouth daily.   Yes [provider]  Multiple Vitamins-Minerals (PRESERVISION AREDS 2+MULTI VIT) CAPS Take by mouth 2 (two) times daily.   Yes [provider]  polyethylene glycol (MIRALAX / GLYCOLAX) packet Take 17 g by mouth daily.    Yes [provider]  pravastatin (PRAVACHOL) 40 MG tablet TAKE 1 TABLET ONCE DAILY. 08/29/17  Yes Reed, Tiffany L, DO  senna-docusate (SENEXON-S) 8.6-50 MG tablet Take 1 tablet by mouth daily as needed.    Yes [provider]    Allergies    Caffeine  Review of Systems   Review of Systems  All other systems reviewed and are negative.   Physical Exam Updated Vital Signs BP 122/88   Pulse 91   Temp 98.1 F (36.7 C) (Oral)   Resp (!) 23   SpO2 97%   Physical Exam Vitals and nursing note reviewed.  Constitutional:      Appearance: She is well-developed.  HENT:     Head: Normocephalic and atraumatic.  Cardiovascular:     Rate and Rhythm: Normal rate and regular rhythm.     Heart sounds: No murmur.  Pulmonary:     Effort: Pulmonary effort is normal. No respiratory distress.     Breath sounds: Normal breath sounds.  Abdominal:     Palpations: Abdomen is soft.     Tenderness: There is no guarding or rebound.     Comments: Mild lower abdominal tenderness  Musculoskeletal:        General: No tenderness.     Comments: 2+ DP pulses bilaterally.  LLE externally rotated and shortened.  TTP over left hip.  Skin:    General: Skin is warm and dry.  Neurological:     Mental Status: She is alert.     Comments: Oriented to self.  Disoriented to place, time and recent events.    Psychiatric:        Behavior: Behavior normal.     ED Results /  Procedures / Treatments   Labs (all labs ordered are listed, but only abnormal results are displayed) Labs Reviewed  COMPREHENSIVE METABOLIC PANEL - Abnormal; Notable for the following components:      Result Value   Glucose, Bld 141 (*)    GFR calc non Af Amer 57 (*)    All other components within normal limits  CBC WITH DIFFERENTIAL/PLATELET - Abnormal; Notable for the following components:   Neutro Abs 9.0 (*)    Lymphs Abs 0.5 (*)    All other components within normal limits  URINALYSIS, ROUTINE W REFLEX MICROSCOPIC - Abnormal; Notable for the following components:   Color,  Urine BROWN (*)    APPearance TURBID (*)    pH 8.5 (*)    Hgb urine dipstick LARGE (*)    Bilirubin Urine MODERATE (*)    Ketones, ur TRACE (*)    Protein, ur 100 (*)    Nitrite POSITIVE (*)    Leukocytes,Ua MODERATE (*)    All other components within normal limits  URINALYSIS, MICROSCOPIC (REFLEX) - Abnormal; Notable for the following components:   Bacteria, UA MANY (*)    All other components within normal limits  SARS CORONAVIRUS 2 (TAT 6-24 HRS)  PROTIME-INR  TYPE AND SCREEN  ABO/RH    EKG None  Radiology DG Chest 1 View  Result Date: 07/12/2019 CLINICAL DATA:  Fall, unwitnessed, yesterday EXAM: CHEST  1 VIEW COMPARISON:  11/14/2017 chest radiograph. FINDINGS: Stable cardiomediastinal silhouette with normal heart size. No pneumothorax. No pleural effusion. Lungs appear clear, with no acute consolidative airspace disease and no pulmonary edema. IMPRESSION: No active disease. Electronically Signed   By: Ilona Sorrel M.D.   On: 07/12/2019 17:47   CT Head Wo Contrast  Result Date: 07/12/2019 CLINICAL DATA:  86 year old female with headache and neck pain following fall yesterday. Initial encounter. EXAM: CT HEAD WITHOUT CONTRAST CT CERVICAL SPINE WITHOUT CONTRAST TECHNIQUE: Multidetector CT imaging of the head and cervical spine was performed following the standard protocol without intravenous  contrast. Multiplanar CT image reconstructions of the cervical spine were also generated. COMPARISON:  11/14/2017 CT FINDINGS: CT HEAD FINDINGS Brain: No evidence of acute infarction, hemorrhage, hydrocephalus, extra-axial collection or mass lesion/mass effect. Atrophy and chronic small-vessel white matter ischemic changes again noted. Vascular: Carotid atherosclerotic calcifications are noted. Skull: Normal. Negative for fracture or focal lesion. Sinuses/Orbits: No acute finding. Other: None. CT CERVICAL SPINE FINDINGS Alignment: Normal. Skull base and vertebrae: No acute fracture. No primary bone lesion or focal pathologic process. Soft tissues and spinal canal: No prevertebral fluid or swelling. No visible canal hematoma. Disc levels: Mild multilevel degenerative disc disease/spondylosis, greatest at C5-C6 again noted. Mild to moderate multilevel facet arthropathy again identified. Upper chest: No acute abnormality. Other: None IMPRESSION: 1. No evidence of acute intracranial abnormality. Atrophy and chronic small-vessel white matter ischemic changes. 2. No static evidence of acute injury to the cervical spine. Electronically Signed   By: Margarette Canada M.D.   On: 07/12/2019 17:30   CT Cervical Spine Wo Contrast  Result Date: 07/12/2019 CLINICAL DATA:  86 year old female with headache and neck pain following fall yesterday. Initial encounter. EXAM: CT HEAD WITHOUT CONTRAST CT CERVICAL SPINE WITHOUT CONTRAST TECHNIQUE: Multidetector CT imaging of the head and cervical spine was performed following the standard protocol without intravenous contrast. Multiplanar CT image reconstructions of the cervical spine were also generated. COMPARISON:  11/14/2017 CT FINDINGS: CT HEAD FINDINGS Brain: No evidence of acute infarction, hemorrhage, hydrocephalus, extra-axial collection or mass lesion/mass effect. Atrophy and chronic small-vessel white matter ischemic changes again noted. Vascular: Carotid atherosclerotic  calcifications are noted. Skull: Normal. Negative for fracture or focal lesion. Sinuses/Orbits: No acute finding. Other: None. CT CERVICAL SPINE FINDINGS Alignment: Normal. Skull base and vertebrae: No acute fracture. No primary bone lesion or focal pathologic process. Soft tissues and spinal canal: No prevertebral fluid or swelling. No visible canal hematoma. Disc levels: Mild multilevel degenerative disc disease/spondylosis, greatest at C5-C6 again noted. Mild to moderate multilevel facet arthropathy again identified. Upper chest: No acute abnormality. Other: None IMPRESSION: 1. No evidence of acute intracranial abnormality. Atrophy and chronic small-vessel white matter ischemic changes. 2.  No static evidence of acute injury to the cervical spine. Electronically Signed   By: Margarette Canada M.D.   On: 07/12/2019 17:30   DG Hip Unilat W or Wo Pelvis 2-3 Views Left  Result Date: 07/12/2019 CLINICAL DATA:  Unwitnessed fall yesterday EXAM: DG HIP (WITH OR WITHOUT PELVIS) 2-3V LEFT COMPARISON:  11/14/2017 left hip radiographs FINDINGS: Subcapital left femoral neck fracture with 2 cm over riding of the fracture fragments and 2 cm lateral displacement of the distal fracture fragment. No dislocation of the left femoral head fracture fragment at the left hip joint. No additional fractures. No pelvic diastasis. No focal osseous lesions. IMPRESSION: Displaced subcapital left femoral neck fracture as detailed. Electronically Signed   By: Ilona Sorrel M.D.   On: 07/12/2019 17:49    Procedures Procedures (including critical care time)  Medications Ordered in ED Medications  fentaNYL (SUBLIMAZE) injection 25 mcg (25 mcg Intravenous Given 07/12/19 1759)    ED Course  I have reviewed the triage vital signs and the nursing notes.  Pertinent labs & imaging results that were available during my care of the patient were reviewed by me and considered in my medical decision making (see chart for details).    MDM  Rules/Calculators/A&P                     Pt here for evaluation of injuries following a fall that occurred last night. She has a left femoral neck fracture. No apparent additional injuries from the fall. Discussed with Dr. Griffin Basil with orthopedics, he will see the patient and consult. He recommends transfer to [facility] due to OR availabilities. Hospitalist consulted for admission. Patient and daughter updated of findings of studies and recommendation for admission and they are in agreement with treatment plan.  Final Clinical Impression(s) / ED Diagnoses Final diagnoses:  Fall, initial encounter  Left displaced femoral neck fracture [facility])    Rx / [place] Orders ED Discharge Orders    None       Quintella Reichert, MD 07/12/19 2223

## 2019-07-12 NOTE — ED Triage Notes (Signed)
Patient presented to ed with c/o of unwitnessed fall at the nursing facility ([facility]) yesterday. Patient had x-ray done at the facility showing fracture of the neck of the left femur.

## 2019-07-12 NOTE — H&P (Signed)
History and Physical    Kaitlyn Ortiz [id number] DOB: Jul 30, 1931 DOA: 07/12/2019  PCP: Gayland Curry, DO  Patient coming from: [facility]  Chief Complaint: inability to bear weight  HPI: Kaitlyn Ortiz is a 86 y.o. female with medical history significant of Parkinson's disease with memory deficit, CAD, HTN, Glaucoma, Bronchiectasis who presents for pain in the hip and inability to walk.  Per family, daughter Kathlee Nations, patient is a patient at the memory care unit at [facility].  She fell yesterday evening.  At that time, the patient reported feeling fine and the staff put her to bed like normal.  However, when she awoke this morning she had pain in the left hip and was unable to walk.  An xray was done on site and showed a hip fracture and she was sent to the ED.  In the ED, she was noted to have a hip fracture on xray.  She did not hit her head as far as the report went and slept normally.  She has had PD for about 3 years.  Walks with a walker.  Daughter is unsure if she has had recent worsening of mobility issues.    ED Course: In the ED, she was noted to have a left hip fracture.  Orthopedics was consulted and requested admission to [facility] as there is no OR availability tomorrow at [facility].   Her INR is 1.0.  Renal function is WNL and she has a mildly elevated glucose.   Review of Systems: As per HPI otherwise 10 point review of systems negative.   Past Medical History:  Diagnosis Date  . Bronchiectasis ([place])   . Cognitive changes    mild  . Dissecting aortic aneurysm (any part), thoracic (HCC)    couple of years ago per daughter.  . Glaucoma   . Heart attack ([place])    mild  . Hypertension   . Incontinence     Past Surgical History:  Procedure Laterality Date  . CATARACT EXTRACTION  1994  . CATARACT EXTRACTION  2006   Reviewed with patient.   reports that she has never smoked. She has never used smokeless tobacco. She reports that she does not drink alcohol or use  drugs.  Allergies  Allergen Reactions  . Caffeine    Patient's mother died when she was a baby.  Family History  Problem Relation Age of Onset  . Stroke Father    Reviewed medication list from [facility] Prior to Admission medications   Medication Sig Start Date End Date Taking? Authorizing Provider  albuterol (ACCUNEB) 0.63 MG/3ML nebulizer solution Take 1 ampule by nebulization 2 (two) times daily.    [provider]  amLODipine (NORVASC) 2.5 MG tablet Take 1 tablet by mouth daily. 06/02/18   [provider]  Ascorbic Acid (VITAMIN C) 1000 MG tablet Take 1,000 mg by mouth daily.    [provider]  aspirin 81 MG chewable tablet Chew 1 tablet by mouth daily.    [provider]  bisacodyl (DULCOLAX) 10 MG suppository Place 10 mg rectally as needed for moderate constipation.    [provider]  carbidopa-levodopa (SINEMET IR) 25-100 MG tablet Take 2.5 tabs TID with meals 04/14/19   Cameron Sprang, MD  Cholecalciferol (VITAMIN D3) 5000 units CAPS Take 1 capsule (5,000 Units total) by mouth daily. 12/26/16   Reed, Tiffany L, DO  donepezil (ARICEPT) 5 MG tablet Take 1 tablet (5 mg total) by mouth at bedtime. 08/21/17  Reed, Tiffany L, DO  Glucosamine-Chondroit-Vit C-Mn (GLUCOSAMINE 1500 COMPLEX PO) Take 1-2 tablets by mouth daily.    [provider]  Lutein 6 MG CAPS Take 1 capsule by mouth daily.    [provider]  Multiple Vitamin (MULTIVITAMIN) tablet Take 1 tablet by mouth daily.    [provider]  Multiple Vitamins-Minerals (PRESERVISION AREDS 2+MULTI VIT) CAPS Take by mouth 2 (two) times daily.    [provider]  polyethylene glycol (MIRALAX / GLYCOLAX) packet Take 17 g by mouth daily.     [provider]  pravastatin (PRAVACHOL) 40 MG tablet TAKE 1 TABLET ONCE DAILY. 08/29/17   Reed, Tiffany L, DO  senna-docusate (SENEXON-S) 8.6-50 MG tablet Take 1 tablet by mouth daily.    [provider]    Physical Exam: Vitals:   07/12/19 1900 07/12/19 1915 07/12/19 1930 07/12/19 1940  BP: (!) 162/110 (!) 172/125 (!) 163/93   Pulse: (!) 101 96 91   Resp: 18 (!) 21 (!) 22   Temp:    98.1 F (36.7 C)  TempSrc:    Oral  SpO2: 97% 94% 96%     Constitutional: NAD, calm, comfortable, awoke to voice Eyes: Left lid with increased droop, EOMI ENMT: Mucous membranes are dry Neck: normal, supple Respiratory: CTAB anteriorly, no wheezing or rales Cardiovascular: Mildly tachycardic, regular, no murmur noted.  Abdomen: +BS, NT, ND Musculoskeletal: no clubbing / cyanosis. Normal muscle tone for age.  + cogwheel rigidity in the upper extremities  Skin: no rashes, lesions, ulcers on exposed skin Neurologic: Grossly intact, she is able to move arms, respond to commands and wiggle toes.  She is confused and feels that she is not in the hospital at this time.  Psychiatric: Confused, somewhat sleepy appearing  Labs on Admission: I have personally reviewed following labs and imaging studies  CBC: Recent Labs  Lab 07/12/19 1653  WBC 10.3  NEUTROABS 9.0*  HGB 13.6  HCT 42.3  MCV 92.4  PLT 99991111   Basic Metabolic Panel: Recent Labs  Lab 07/12/19 1653  NA 142  K 3.6  CL 107  CO2 27  GLUCOSE 141*  BUN 23  CREATININE 0.91  CALCIUM 9.1   GFR: CrCl cannot be calculated (Unknown ideal weight.). Liver Function Tests: Recent Labs  Lab 07/12/19 1653  AST 34  ALT 18  ALKPHOS 51  BILITOT 0.9  PROT 7.5  ALBUMIN 4.3   No results for input(s): LIPASE, AMYLASE in the last 168 hours. No results for input(s): AMMONIA in the last 168 hours. Coagulation Profile: Recent Labs  Lab 07/12/19 1653  INR 1.0   Cardiac Enzymes: No results for input(s): CKTOTAL, CKMB, CKMBINDEX, TROPONINI in the last 168 hours. BNP (last 3 results) No results for input(s): PROBNP in the last 8760 hours. HbA1C: No results for input(s): HGBA1C in the last 72 hours. CBG: No results for input(s): GLUCAP in  the last 168 hours. Lipid Profile: No results for input(s): CHOL, HDL, LDLCALC, TRIG, CHOLHDL, LDLDIRECT in the last 72 hours. Thyroid Function Tests: No results for input(s): TSH, T4TOTAL, FREET4, T3FREE, THYROIDAB in the last 72 hours. Anemia Panel: No results for input(s): VITAMINB12, FOLATE, FERRITIN, TIBC, IRON, RETICCTPCT in the last 72 hours. Urine analysis:    Component Value Date/Time   COLORURINE YELLOW 11/15/2017 0211   APPEARANCEUR CLOUDY (A) 11/15/2017 0211   LABSPEC 1.017 11/15/2017 0211   PHURINE 5.0 11/15/2017 0211   GLUCOSEU NEGATIVE 11/15/2017 0211   HGBUR NEGATIVE 11/15/2017 0211  BILIRUBINUR NEGATIVE 11/15/2017 0211   KETONESUR NEGATIVE 11/15/2017 0211   PROTEINUR NEGATIVE 11/15/2017 0211   NITRITE NEGATIVE 11/15/2017 0211   LEUKOCYTESUR LARGE (A) 11/15/2017 0211    Radiological Exams on Admission: DG Chest 1 View  Result Date: 07/12/2019 CLINICAL DATA:  Fall, unwitnessed, yesterday EXAM: CHEST  1 VIEW COMPARISON:  11/14/2017 chest radiograph. FINDINGS: Stable cardiomediastinal silhouette with normal heart size. No pneumothorax. No pleural effusion. Lungs appear clear, with no acute consolidative airspace disease and no pulmonary edema. IMPRESSION: No active disease. Electronically Signed   By: Ilona Sorrel M.D.   On: 07/12/2019 17:47   CT Head Wo Contrast  Result Date: 07/12/2019 CLINICAL DATA:  86 year old female with headache and neck pain following fall yesterday. Initial encounter. EXAM: CT HEAD WITHOUT CONTRAST CT CERVICAL SPINE WITHOUT CONTRAST TECHNIQUE: Multidetector CT imaging of the head and cervical spine was performed following the standard protocol without intravenous contrast. Multiplanar CT image reconstructions of the cervical spine were also generated. COMPARISON:  11/14/2017 CT FINDINGS: CT HEAD FINDINGS Brain: No evidence of acute infarction, hemorrhage, hydrocephalus, extra-axial collection or mass lesion/mass effect. Atrophy and chronic  small-vessel white matter ischemic changes again noted. Vascular: Carotid atherosclerotic calcifications are noted. Skull: Normal. Negative for fracture or focal lesion. Sinuses/Orbits: No acute finding. Other: None. CT CERVICAL SPINE FINDINGS Alignment: Normal. Skull base and vertebrae: No acute fracture. No primary bone lesion or focal pathologic process. Soft tissues and spinal canal: No prevertebral fluid or swelling. No visible canal hematoma. Disc levels: Mild multilevel degenerative disc disease/spondylosis, greatest at C5-C6 again noted. Mild to moderate multilevel facet arthropathy again identified. Upper chest: No acute abnormality. Other: None IMPRESSION: 1. No evidence of acute intracranial abnormality. Atrophy and chronic small-vessel white matter ischemic changes. 2. No static evidence of acute injury to the cervical spine. Electronically Signed   By: Margarette Canada M.D.   On: 07/12/2019 17:30   CT Cervical Spine Wo Contrast  Result Date: 07/12/2019 CLINICAL DATA:  86 year old female with headache and neck pain following fall yesterday. Initial encounter. EXAM: CT HEAD WITHOUT CONTRAST CT CERVICAL SPINE WITHOUT CONTRAST TECHNIQUE: Multidetector CT imaging of the head and cervical spine was performed following the standard protocol without intravenous contrast. Multiplanar CT image reconstructions of the cervical spine were also generated. COMPARISON:  11/14/2017 CT FINDINGS: CT HEAD FINDINGS Brain: No evidence of acute infarction, hemorrhage, hydrocephalus, extra-axial collection or mass lesion/mass effect. Atrophy and chronic small-vessel white matter ischemic changes again noted. Vascular: Carotid atherosclerotic calcifications are noted. Skull: Normal. Negative for fracture or focal lesion. Sinuses/Orbits: No acute finding. Other: None. CT CERVICAL SPINE FINDINGS Alignment: Normal. Skull base and vertebrae: No acute fracture. No primary bone lesion or focal pathologic process. Soft tissues and  spinal canal: No prevertebral fluid or swelling. No visible canal hematoma. Disc levels: Mild multilevel degenerative disc disease/spondylosis, greatest at C5-C6 again noted. Mild to moderate multilevel facet arthropathy again identified. Upper chest: No acute abnormality. Other: None IMPRESSION: 1. No evidence of acute intracranial abnormality. Atrophy and chronic small-vessel white matter ischemic changes. 2. No static evidence of acute injury to the cervical spine. Electronically Signed   By: Margarette Canada M.D.   On: 07/12/2019 17:30   DG Hip Unilat W or Wo Pelvis 2-3 Views Left  Result Date: 07/12/2019 CLINICAL DATA:  Unwitnessed fall yesterday EXAM: DG HIP (WITH OR WITHOUT PELVIS) 2-3V LEFT COMPARISON:  11/14/2017 left hip radiographs FINDINGS: Subcapital left femoral neck fracture with 2 cm over riding of the fracture fragments and  2 cm lateral displacement of the distal fracture fragment. No dislocation of the left femoral head fracture fragment at the left hip joint. No additional fractures. No pelvic diastasis. No focal osseous lesions. IMPRESSION: Displaced subcapital left femoral neck fracture as detailed. Electronically Signed   By: Ilona Sorrel M.D.   On: 07/12/2019 17:49    EKG: Independently reviewed. Sinus.  Excessive artifact  Assessment/Plan Hip fracture Osteopenia - Related to fall, not down for prolonged period - Orthopedics consulted, plan for surgery tomorrow - NPO - Admit to [facility] - Pain control with tylenol, morphine - Heparin SQ overnight, hold prior to surgery - IVF at 75cc/hr while NPO - Neurovascular checks - Bed rest until surgery - POC COVID negative, COVID PCR pending  Dementia without behavioral disturbance (HCC) Parkinsonism ([place]) Recurrent falls - Lives in ALF, memory care - May need higher level of care after fixation of hip - Continue Sinemet at home dose - Continue donepezil at home dose    Essential hypertension - Continue home dose of amlodipine      Bronchiectasis - Continue home BID nebulizer    Coronary artery disease with history of myocardial infarction without history of CABG Thoracic aortic aneurysm without rupture - Hold home aspirin in anticipation of surgery - No change or acute needs at this time - Monitor for chest pain or other changes    Hyperlipidemia LDL goal <100 - Continue home pravastatin     DVT prophylaxis: Heparin SQ, hold dose prior to surgery Code Status: DNR [place] Rm sheet reviewed)  Family Communication: Kathlee Nations, daughter at bedside  Disposition Plan: Admit to [facility] for [facility]  Consults called: Orthopedics, [place]  Admission status: Med Surg, INP    Gilles Chiquito MD [facility]  If 7PM-7AM, please contact night-coverage [email or web address] Use universal [facility] password for that web site. If you do not have the password, please call the hospital operator.  07/12/2019, 8:08 PM

## 2019-07-12 NOTE — ED Notes (Signed)
Unable to send culture down with urine sample due to small amount.

## 2019-07-12 NOTE — Progress Notes (Signed)
Orthopedics was consult it for left femoral neck fracture. Patient will need to be hospitalist admitted to [facility] and optimized for surgery. Dr. Lyla Glassing will take over care and plans to do surgery tomorrow at noon. Will need NPO at midnight and Covid test complete prior to then.

## 2019-07-13 ENCOUNTER — Inpatient Hospital Stay (HOSPITAL_COMMUNITY): Payer: Medicare Other | Admitting: Anesthesiology

## 2019-07-13 ENCOUNTER — Encounter (HOSPITAL_COMMUNITY)
Admission: EM | Disposition: A | Discharge status: Skilled Nursing Facility | Payer: Self-pay | Source: Home / Self Care | Attending: Internal Medicine

## 2019-07-13 ENCOUNTER — Inpatient Hospital Stay (HOSPITAL_COMMUNITY): Payer: Medicare Other

## 2019-07-13 HISTORY — PX: TOTAL HIP ARTHROPLASTY: SHX124

## 2019-07-13 LAB — BASIC METABOLIC PANEL
Anion gap: 10 (ref 5–15)
BUN: 20 mg/dL (ref 8–23)
CO2: 25 mmol/L (ref 22–32)
Calcium: 8.3 mg/dL — ABNORMAL LOW (ref 8.9–10.3)
Chloride: 107 mmol/L (ref 98–111)
Creatinine, Ser: 0.88 mg/dL (ref 0.44–1.00)
GFR calc Af Amer: >60 mL/min (ref >60)
GFR calc non Af Amer: 59 mL/min — ABNORMAL LOW (ref >60)
Glucose, Bld: 137 mg/dL — ABNORMAL HIGH (ref 70–99)
Potassium: 3.6 mmol/L (ref 3.5–5.1)
Sodium: 142 mmol/L (ref 135–145)

## 2019-07-13 LAB — CBC
HCT: 37.9 % (ref 36.0–46.0)
Hemoglobin: 12.5 g/dL (ref 12.0–15.0)
MCH: 29.9 pg (ref 26.0–34.0)
MCHC: 33 g/dL (ref 30.0–36.0)
MCV: 90.7 fL (ref 80.0–100.0)
Platelets: 177 10*3/uL (ref 150–400)
RBC: 4.18 MIL/uL (ref 3.87–5.11)
RDW: 15.6 % — ABNORMAL HIGH (ref 11.5–15.5)
WBC: 9.9 10*3/uL (ref 4.0–10.5)
nRBC: 0 % (ref 0.0–0.2)

## 2019-07-13 LAB — SURGICAL PCR SCREEN
MRSA, PCR: NEGATIVE
Staphylococcus aureus: NEGATIVE

## 2019-07-13 LAB — TYPE AND SCREEN
ABO/RH(D): O NEG
Antibody Screen: NEGATIVE

## 2019-07-13 LAB — ABO/RH: ABO/RH(D): O NEG

## 2019-07-13 LAB — SARS CORONAVIRUS 2 (TAT 6-24 HRS): SARS Coronavirus 2: NEGATIVE

## 2019-07-13 SURGERY — ARTHROPLASTY, HIP, TOTAL, ANTERIOR APPROACH
Anesthesia: Spinal | Site: Hip | Laterality: Left

## 2019-07-13 MED ORDER — MENTHOL 3 MG MT LOZG: 1.0000 | LOZENGE | OROMUCOSAL | Status: DC | PRN

## 2019-07-13 MED ORDER — ENOXAPARIN SODIUM 40 MG/0.4ML ~~LOC~~ SOLN
40.0000 mg | SUBCUTANEOUS | Status: DC
  Administered 2019-07-14 – 2019-07-16 (×3): 40 mg via SUBCUTANEOUS
  Filled 2019-07-13 (×3): qty 0.4

## 2019-07-13 MED ORDER — PROPOFOL 1000 MG/100ML IV EMUL
INTRAVENOUS | Status: AC
  Filled 2019-07-13: qty 100

## 2019-07-13 MED ORDER — ONDANSETRON HCL 4 MG/2ML IJ SOLN: 4.0000 mg | Freq: Once | INTRAMUSCULAR | Status: DC | PRN

## 2019-07-13 MED ORDER — IRRISEPT - 450ML BOTTLE WITH 0.05% CHG IN STERILE WATER, USP 99.95% OPTIME
TOPICAL | Status: DC | PRN
  Administered 2019-07-13: 450 mL

## 2019-07-13 MED ORDER — ONDANSETRON HCL 4 MG/2ML IJ SOLN
INTRAMUSCULAR | Status: AC
  Filled 2019-07-13: qty 2

## 2019-07-13 MED ORDER — POVIDONE-IODINE 10 % EX SWAB: 2.0000 "application " | Freq: Once | CUTANEOUS | Status: DC

## 2019-07-13 MED ORDER — BUPIVACAINE HCL 0.5 % IJ SOLN
INTRAMUSCULAR | Status: AC
  Filled 2019-07-13: qty 1

## 2019-07-13 MED ORDER — TRANEXAMIC ACID-NACL 1000-0.7 MG/100ML-% IV SOLN
INTRAVENOUS | Status: AC
  Filled 2019-07-13: qty 100

## 2019-07-13 MED ORDER — ONDANSETRON HCL 4 MG PO TABS: 4.0000 mg | ORAL_TABLET | Freq: Four times a day (QID) | ORAL | Status: DC | PRN

## 2019-07-13 MED ORDER — LIDOCAINE 2% (20 MG/ML) 5 ML SYRINGE
INTRAMUSCULAR | Status: AC
  Filled 2019-07-13: qty 5

## 2019-07-13 MED ORDER — ACETAMINOPHEN 10 MG/ML IV SOLN: 1000.0000 mg | Freq: Once | INTRAVENOUS | Status: DC | PRN

## 2019-07-13 MED ORDER — FENTANYL CITRATE (PF) 100 MCG/2ML IJ SOLN
INTRAMUSCULAR | Status: DC | PRN
  Administered 2019-07-13 (×4): 25 ug via INTRAVENOUS

## 2019-07-13 MED ORDER — ONDANSETRON HCL 4 MG/2ML IJ SOLN: 4.0000 mg | Freq: Four times a day (QID) | INTRAMUSCULAR | Status: DC | PRN

## 2019-07-13 MED ORDER — FENTANYL CITRATE (PF) 250 MCG/5ML IJ SOLN
INTRAMUSCULAR | Status: AC
  Filled 2019-07-13: qty 5

## 2019-07-13 MED ORDER — PROPOFOL 500 MG/50ML IV EMUL
INTRAVENOUS | Status: DC | PRN
  Administered 2019-07-13: 25 ug/kg/min via INTRAVENOUS

## 2019-07-13 MED ORDER — BUPIVACAINE HCL (PF) 0.25 % IJ SOLN
INTRAMUSCULAR | Status: AC
  Filled 2019-07-13: qty 30

## 2019-07-13 MED ORDER — DEXAMETHASONE SODIUM PHOSPHATE 10 MG/ML IJ SOLN
INTRAMUSCULAR | Status: AC
  Filled 2019-07-13: qty 1

## 2019-07-13 MED ORDER — MORPHINE SULFATE (PF) 2 MG/ML IV SOLN: 1.0000 mg | INTRAVENOUS | Status: DC | PRN

## 2019-07-13 MED ORDER — MIDAZOLAM HCL 2 MG/2ML IJ SOLN
INTRAMUSCULAR | Status: AC
  Filled 2019-07-13: qty 2

## 2019-07-13 MED ORDER — BUPIVACAINE IN DEXTROSE 0.75-8.25 % IT SOLN
INTRATHECAL | Status: DC | PRN
  Administered 2019-07-13: 2 mL via INTRATHECAL

## 2019-07-13 MED ORDER — 0.9 % SODIUM CHLORIDE (POUR BTL) OPTIME
TOPICAL | Status: DC | PRN
  Administered 2019-07-13: 1000 mL

## 2019-07-13 MED ORDER — CEFAZOLIN SODIUM-DEXTROSE 2-4 GM/100ML-% IV SOLN
2.0000 g | INTRAVENOUS | Status: DC
  Filled 2019-07-13: qty 100

## 2019-07-13 MED ORDER — DOCUSATE SODIUM 100 MG PO CAPS
100.0000 mg | ORAL_CAPSULE | Freq: Two times a day (BID) | ORAL | Status: DC
  Administered 2019-07-13 – 2019-07-15 (×6): 100 mg via ORAL
  Filled 2019-07-13 (×6): qty 1

## 2019-07-13 MED ORDER — ENSURE PRE-SURGERY PO LIQD
296.0000 mL | Freq: Once | ORAL | Status: DC
  Filled 2019-07-13: qty 296

## 2019-07-13 MED ORDER — CEFAZOLIN SODIUM-DEXTROSE 2-4 GM/100ML-% IV SOLN
2.0000 g | Freq: Four times a day (QID) | INTRAVENOUS | Status: AC
  Administered 2019-07-13 (×2): 2 g via INTRAVENOUS
  Filled 2019-07-13: qty 100

## 2019-07-13 MED ORDER — METOCLOPRAMIDE HCL 5 MG PO TABS: 5.0000 mg | ORAL_TABLET | Freq: Three times a day (TID) | ORAL | Status: DC | PRN

## 2019-07-13 MED ORDER — ONDANSETRON HCL 4 MG/2ML IJ SOLN
INTRAMUSCULAR | Status: DC | PRN
  Administered 2019-07-13: 4 mg via INTRAVENOUS

## 2019-07-13 MED ORDER — PHENYLEPHRINE HCL-NACL 10-0.9 MG/250ML-% IV SOLN
INTRAVENOUS | Status: DC | PRN
  Administered 2019-07-13: 30 ug/min via INTRAVENOUS

## 2019-07-13 MED ORDER — METOCLOPRAMIDE HCL 5 MG/ML IJ SOLN: 5.0000 mg | Freq: Three times a day (TID) | INTRAMUSCULAR | Status: DC | PRN

## 2019-07-13 MED ORDER — LACTATED RINGERS IV SOLN: INTRAVENOUS | Status: DC

## 2019-07-13 MED ORDER — SODIUM CHLORIDE 0.9 % IR SOLN
Status: DC | PRN
  Administered 2019-07-13: 3000 mL
  Administered 2019-07-13: 1000 mL

## 2019-07-13 MED ORDER — TRANEXAMIC ACID-NACL 1000-0.7 MG/100ML-% IV SOLN
1000.0000 mg | INTRAVENOUS | Status: AC
  Administered 2019-07-13: 1000 mg via INTRAVENOUS

## 2019-07-13 MED ORDER — DEXAMETHASONE SODIUM PHOSPHATE 10 MG/ML IJ SOLN
INTRAMUSCULAR | Status: DC | PRN
  Administered 2019-07-13: 6 mg via INTRAVENOUS

## 2019-07-13 MED ORDER — KETOROLAC TROMETHAMINE 30 MG/ML IJ SOLN
INTRAMUSCULAR | Status: DC | PRN
  Administered 2019-07-13: 30 mg

## 2019-07-13 MED ORDER — KETOROLAC TROMETHAMINE 30 MG/ML IJ SOLN
INTRAMUSCULAR | Status: AC
  Filled 2019-07-13: qty 1

## 2019-07-13 MED ORDER — SODIUM CHLORIDE (PF) 0.9 % IJ SOLN
INTRAMUSCULAR | Status: DC | PRN
  Administered 2019-07-13 (×3): 10 mL

## 2019-07-13 MED ORDER — CHLORHEXIDINE GLUCONATE 4 % EX LIQD: 60.0000 mL | Freq: Once | CUTANEOUS | Status: DC

## 2019-07-13 MED ORDER — FENTANYL CITRATE (PF) 100 MCG/2ML IJ SOLN: 25.0000 ug | INTRAMUSCULAR | Status: DC | PRN

## 2019-07-13 MED ORDER — PHENOL 1.4 % MT LIQD: 1.0000 | OROMUCOSAL | Status: DC | PRN

## 2019-07-13 MED ORDER — SODIUM CHLORIDE 0.9 % IV SOLN: INTRAVENOUS | Status: AC

## 2019-07-13 MED ORDER — BUPIVACAINE HCL (PF) 0.5 % IJ SOLN
INTRAMUSCULAR | Status: DC | PRN
  Administered 2019-07-13: 30 mL

## 2019-07-13 SURGICAL SUPPLY — 55 items
ALCOHOL 70% 16 OZ (MISCELLANEOUS) ×3 IMPLANT
CHLORAPREP W/TINT 26 (MISCELLANEOUS) ×3 IMPLANT
COVER SURGICAL LIGHT HANDLE (MISCELLANEOUS) ×3 IMPLANT
DERMABOND ADVANCED (GAUZE/BANDAGES/DRESSINGS) ×2
DERMABOND ADVANCED .7 DNX12 (GAUZE/BANDAGES/DRESSINGS) ×2 IMPLANT
DRAPE C-ARM 42X72 X-RAY (DRAPES) ×3 IMPLANT
DRAPE STERI IOBAN 125X83 (DRAPES) ×3 IMPLANT
DRAPE U-SHAPE 47X51 STRL (DRAPES) ×9 IMPLANT
DRSG AQUACEL AG ADV 3.5X10 (GAUZE/BANDAGES/DRESSINGS) ×3 IMPLANT
ELECT BLADE 4.0 EZ CLEAN MEGAD (MISCELLANEOUS) ×3
ELECT PENCIL ROCKER SW 15FT (MISCELLANEOUS) ×3 IMPLANT
ELECT REM PT RETURN 9FT ADLT (ELECTROSURGICAL) ×3
ELECTRODE BLDE 4.0 EZ CLN MEGD (MISCELLANEOUS) ×1 IMPLANT
ELECTRODE REM PT RTRN 9FT ADLT (ELECTROSURGICAL) ×1 IMPLANT
GLOVE BIO SURGEON STRL SZ8.5 (GLOVE) ×8 IMPLANT
GLOVE BIOGEL PI IND STRL 8.5 (GLOVE) ×1 IMPLANT
GLOVE BIOGEL PI INDICATOR 8.5 (GLOVE) ×2
GOWN STRL REUS W/ TWL LRG LVL3 (GOWN DISPOSABLE) ×2 IMPLANT
GOWN STRL REUS W/TWL 2XL LVL3 (GOWN DISPOSABLE) ×3 IMPLANT
GOWN STRL REUS W/TWL LRG LVL3 (GOWN DISPOSABLE) ×6
HANDPIECE INTERPULSE COAX TIP (DISPOSABLE) ×2
HEAD FEM UNIPOLAR 47 OD STRL (Hips) ×2 IMPLANT
HOOD PEEL AWAY FACE SHEILD DIS (HOOD) ×6 IMPLANT
JET LAVAGE IRRISEPT WOUND (IRRIGATION / IRRIGATOR) ×3
KIT BASIN OR (CUSTOM PROCEDURE TRAY) ×3 IMPLANT
KIT TURNOVER KIT B (KITS) ×3 IMPLANT
LAVAGE JET IRRISEPT WOUND (IRRIGATION / IRRIGATOR) ×1 IMPLANT
MANIFOLD NEPTUNE II (INSTRUMENTS) ×3 IMPLANT
MARKER SKIN DUAL TIP RULER LAB (MISCELLANEOUS) ×6 IMPLANT
NDL SPNL 18GX3.5 QUINCKE PK (NEEDLE) ×1 IMPLANT
NEEDLE SPNL 18GX3.5 QUINCKE PK (NEEDLE) ×3 IMPLANT
NS IRRIG 1000ML POUR BTL (IV SOLUTION) ×3 IMPLANT
PACK TOTAL JOINT (CUSTOM PROCEDURE TRAY) ×3 IMPLANT
PACK UNIVERSAL I (CUSTOM PROCEDURE TRAY) ×3 IMPLANT
PAD ARMBOARD 7.5X6 YLW CONV (MISCELLANEOUS) ×10 IMPLANT
SAW OSC TIP CART 19.5X105X1.3 (SAW) ×3 IMPLANT
SEALER BIPOLAR AQUA 6.0 (INSTRUMENTS) ×2 IMPLANT
SET HNDPC FAN SPRY TIP SCT (DISPOSABLE) ×1 IMPLANT
SOL PREP POV-IOD 4OZ 10% (MISCELLANEOUS) ×3 IMPLANT
SPACER DEPUY (Hips) ×2 IMPLANT
STEM TRI LOC BPS SZ6 W GRIPTON IMPLANT
SUT ETHIBOND NAB CT1 #1 30IN (SUTURE) ×6 IMPLANT
SUT MNCRL AB 3-0 PS2 18 (SUTURE) ×5 IMPLANT
SUT MON AB 2-0 CT1 36 (SUTURE) ×3 IMPLANT
SUT VIC AB 1 CT1 27 (SUTURE) ×2
SUT VIC AB 1 CT1 27XBRD ANBCTR (SUTURE) ×1 IMPLANT
SUT VIC AB 2-0 CT1 27 (SUTURE) ×4
SUT VIC AB 2-0 CT1 TAPERPNT 27 (SUTURE) ×1 IMPLANT
SUT VLOC 180 0 24IN GS25 (SUTURE) ×3 IMPLANT
SYR 50ML LL SCALE MARK (SYRINGE) ×3 IMPLANT
TOWEL GREEN STERILE (TOWEL DISPOSABLE) ×3 IMPLANT
TOWEL GREEN STERILE FF (TOWEL DISPOSABLE) ×3 IMPLANT
TRI LOC BPS SZ 6 W GRIPTON ×3 IMPLANT
WATER STERILE IRR 1000ML POUR (IV SOLUTION) ×9 IMPLANT
YANKAUER SUCT BULB TIP NO VENT (SUCTIONS) ×2 IMPLANT

## 2019-07-13 NOTE — Progress Notes (Signed)
Pt oriented to person only. Pt pain under control. Unable to obtain admission assessment d/t dementia. Will pass on to day shift nurse to obtain information from pt daughter, Kathlee Nations. Will continue to monitor pt.

## 2019-07-13 NOTE — Anesthesia Procedure Notes (Signed)
Spinal  Patient location during procedure: OR Start time: 07/13/2019 12:25 PM End time: 07/13/2019 12:35 PM Staffing Anesthesiologist: Janeece Riggers, MD Preanesthetic Checklist Completed: patient identified, IV checked, site marked, risks and benefits discussed, surgical consent, monitors and equipment checked, pre-op evaluation and timeout performed Spinal Block Patient position: sitting Prep: DuraPrep Patient monitoring: heart rate, cardiac monitor, continuous pulse ox and blood pressure Approach: midline Location: L2-3 Injection technique: single-shot Needle Needle type: Sprotte  Needle gauge: 22 G Needle length: 9 cm Assessment Sensory level: T4 Additional Notes Multiple attempts with re-prep +CSF / DOSE / +CSF

## 2019-07-13 NOTE — Op Note (Signed)
OPERATIVE REPORT  SURGEON: Rod Can, MD   ASSISTANT: Laure Kidney, RNFA  PREOPERATIVE DIAGNOSIS: Displaced Left femoral neck fracture.   POSTOPERATIVE DIAGNOSIS: Displaced Left femoral neck fracture.   PROCEDURE: Left hip hemiarthroplasty, anterior approach.   IMPLANTS: DePuy Tri Lock stem, size 6, hi offset, with a -3 mm spacer and a 47 mm monopolar head ball.  ANESTHESIA:  Spinal  ANTIBIOTICS: 2g ancef.  ESTIMATED BLOOD LOSS: 350 mL.  DRAINS: None.  COMPLICATIONS: None   CONDITION: PACU - hemodynamically stable.   BRIEF CLINICAL NOTE: Kaitlyn Ortiz is a 86 y.o. female with a displaced Left femoral neck fracture. The patient was admitted to the hospitalist service and underwent perioperative risk stratification and medical optimization. The risks, benefits, and alternatives to hemiarthroplasty were explained, and the patient elected to proceed.  PROCEDURE IN DETAIL: The patient was taken to the operating room and general anesthesia was induced on the hospital bed.  The patient was then positioned on the [place] table.  All bony prominences were well padded.  The hip was prepped and draped in the normal sterile surgical fashion.  A time-out was called verifying side and site of surgery. Antibiotics were given within 60 minutes of beginning the procedure.   The direct anterior approach to the hip was performed through the Hueter interval.  Lateral femoral circumflex vessels were treated with the Auqumantys. The anterior capsule was exposed and an inverted T capsulotomy was made.  Fracture hematoma was encountered and evacuated. The patient was found to have a comminuted Left subcapital femoral neck fracture.  I freshened the femoral neck cut with a saw.  I removed the femoral neck fragment.  A corkscrew was placed into the head and the head was removed.  This was passed to the back table and was measured.   Acetabular exposure was achieved.  I examined the articular  cartilage which was intact.  The labrum was intact. A 47 mm trial head was placed and found to have excellent fit.   I then gained femoral exposure taking care to protect the abductors and greater trochanter.  This was performed using standard external rotation, extension, and adduction.  The capsule was peeled off the inner aspect of the greater trochanter, taking care to preserve the short external rotators. A cookie cutter was used to enter the femoral canal, and then the femoral canal finder was used to confirm location.  I then sequentially broached up to a size 6.  Calcar planer was used on the femoral neck remnant.  I paced a hi neck and a 36 + 1.5 head ball. The hip was reduced.  Leg lengths were checked fluoroscopically.  The hip was dislocated and trial components were removed.  I placed the real stem followed by the real spacer and head ball.  A single reduction maneuver was performed and the hip was reduced.  Fluoroscopy was used to confirm component position and leg lengths.  At 90 degrees of external rotation and extension, the hip was stable to an anterior directed force.   The wound was copiously irrigated with Irrisept solution and normal saline using pule lavage.  Marcaine solution was injected into the periarticular soft tissue.  The wound was closed in layers using #1 Vicryl and V-Loc for the fascia, 2-0 Vicryl for the subcutaneous fat, 2-0 Monocryl for the deep dermal layer, 3-0 running Monocryl subcuticular stitch and glue for the skin.  Once the glue was fully dried, an Aquacell Ag dressing was applied.  The patient was  then awakened from anesthesia and transported to the recovery room in stable condition.  Sponge, needle, and instrument counts were correct at the end of the case x2.  The patient tolerated the procedure well and there were no known complications.  Please note that a surgical assistant was a medical necessity for this procedure to perform it in a safe and expeditious  manner. Assistant was necessary to provide appropriate retraction of vital neurovascular structures, to prevent femoral fracture, and to allow for anatomic placement of the prosthesis.

## 2019-07-13 NOTE — Transfer of Care (Signed)
Immediate Anesthesia Transfer of Care Note  Patient: Kaitlyn Ortiz  Procedure(s) Performed: LEFT HEMI-HIP ARTHROPLASTY ANTERIOR APPROACH (Left Hip)  Patient Location: PACU  Anesthesia Type:MAC and Spinal  Level of Consciousness: sedated  Airway & Oxygen Therapy: Patient connected to face mask oxygen  Post-op Assessment: Post -op Vital signs reviewed and stable  Post vital signs: stable  Last Vitals:  Vitals Value Taken Time  BP 108/71 07/13/19 1448  Temp    Pulse    Resp 12 07/13/19 1451  SpO2    Vitals shown include unvalidated device data.  Last Pain:  Vitals:   07/13/19 0744  TempSrc: Oral  PainSc: Asleep         Complications: No apparent anesthesia complications

## 2019-07-13 NOTE — Interval H&P Note (Signed)
History and Physical Interval Note:  07/13/2019 12:01 PM  Kaitlyn Ortiz  has presented today for surgery, with the diagnosis of left hip fracture.  The various methods of treatment have been discussed with the patient and family. After consideration of risks, benefits and other options for treatment, the patient has consented to  Procedure(s): LEFT HEMI-HIP ARTHROPLASTY ANTERIOR APPROACH (Left) as a surgical intervention.  The patient's history has been reviewed, patient examined, no change in status, stable for surgery.  I have reviewed the patient's chart and labs.  Questions were answered to the patient's satisfaction.    The risks, benefits, and alternatives were discussed with the patient. There are risks associated with the surgery including, but not limited to, problems with anesthesia (death), infection, instability (giving out of the joint), dislocation, differences in leg length/angulation/rotation, fracture of bones, loosening or failure of implants, hematoma (blood accumulation) which may require surgical drainage, blood clots, pulmonary embolism, nerve injury (foot drop and lateral thigh numbness), and blood vessel injury. The patient understands these risks and elects to proceed.   Discussed the situation with the patient and her daughter. She resides in [facility] care [facility]

## 2019-07-13 NOTE — Plan of Care (Signed)

## 2019-07-13 NOTE — Progress Notes (Signed)
PROGRESS NOTE    Kaitlyn Ortiz  [id number] DOB: 03-Mar-1932 DOA: 07/12/2019 PCP: Gayland Curry, DO   Brief Narrative: Patient is a 86 year old female with history of Parkinson's disease with memory deficit, coronary disease, hypertension, glaucoma, bronchiectasis who presented with left hip pain, unable to ambulate.  Patient resides in memory care unit at [facility].  There was report of a fall.  On presentation, she was hemodynamically stable.  X-ray done on presentation showed left hip fracture.  Orthopedics consulted, undergoing a left hemihip arthroplasty on 07/13/19  Assessment & Plan:   Active Problems:   Essential hypertension   Bronchiectasis (HCC)   Coronary artery disease with history of myocardial infarction without history of CABG   Hyperlipidemia LDL goal <100   Thoracic aortic aneurysm without rupture (HCC)   Dementia without behavioral disturbance ([place])   Parkinsonism ([place])   Recurrent falls   Osteopenia   Hip fracture (HCC)   Left hip fracture: Found to have left femoral neck fracture.  Secondary to mechanical fall.  Orthopedics consulted and planning for ORIF.  Continue pain management.  DVT prophylaxis per orthopedics.  Currently on bedrest.  PT/OT evaluation after surgery  Dementia/parkinsonism/recurrent falls: Lives in ALF, memory care.  Might need to skilled facility after discharge.  Continue supportive care.  On Sinemet, donepezil  Hypertension: Currently blood pressure stable.  Continue amlodipine  History of bronchiectasis: Currently respiratory status stable.  Continue home inhalers  Coronary artery disease: On aspirin at home which is on hold.  No chest pain at present.  Continue home medications  Hyperlipidemia: Continue pravastatin         DVT prophylaxis:Heparin [place] Code Status: DNR Family Communication: None present the bedside Disposition Plan: Patient is from memory care at [facility].  Discharge planning as per PT/OT  evaluation   Consultants: Orthopedics  Procedures: None  Antimicrobials:  Anti-infectives (From admission, onward)   None      Subjective: Patient seen and examined the bedside this morning but hemodynamically stable.  Confused, disoriented.  Not in any kind of distress.  Objective: Vitals:   07/12/19 2215 07/12/19 2329 07/13/19 0334 07/13/19 0744  BP: 122/88 (!) 143/90 (!) 158/91 (!) 144/83  Pulse: 91 91 87 82  Resp: (!) 23 18  16   Temp:  98.9 F (37.2 C) 97.6 F (36.4 C) 98.6 F (37 C)  TempSrc:  Oral Oral Oral  SpO2: 97% 99% 99% 93%    Intake/Output Summary (Last 24 hours) at 07/13/2019 [id number] Last data filed at 07/13/2019 0309 Gross per 24 hour  Intake 196.91 ml  Output --  Net 196.91 ml   There were no vitals filed for this visit.  Examination:  General exam: Elderly, debilitated Respiratory system: Bilateral equal air entry, normal vesicular breath sounds, no wheezes or crackles  Cardiovascular system: S1 & S2 heard, RRR. No JVD, murmurs, rubs, gallops or clicks. No pedal edema. Gastrointestinal system: Abdomen is nondistended, soft and nontender. No organomegaly or masses felt. Normal bowel sounds heard. Central nervous system: Not Alert and oriented.  Extremities: No edema, no clubbing ,no cyanosis, tenderness on the left hip  skin: No rashes, lesions or ulcers,no icterus ,no pallor    Data Reviewed: I have personally reviewed following labs and imaging studies  CBC: Recent Labs  Lab 07/12/19 1653 07/13/19 0302  WBC 10.3 9.9  NEUTROABS 9.0*  --   HGB 13.6 12.5  HCT 42.3 37.9  MCV 92.4 90.7  PLT 180 [id number]   Basic Metabolic Panel: Recent Labs  Lab 07/12/19 1653 07/13/19 0302  NA 142 142  K 3.6 3.6  CL 107 107  CO2 27 25  GLUCOSE 141* 137*  BUN 23 20  CREATININE 0.91 0.88  CALCIUM 9.1 8.3*   GFR: CrCl cannot be calculated (Unknown ideal weight.). Liver Function Tests: Recent Labs  Lab 07/12/19 1653  AST 34  ALT 18  ALKPHOS 51   BILITOT 0.9  PROT 7.5  ALBUMIN 4.3   No results for input(s): LIPASE, AMYLASE in the last 168 hours. No results for input(s): AMMONIA in the last 168 hours. Coagulation Profile: Recent Labs  Lab 07/12/19 1653  INR 1.0   Cardiac Enzymes: No results for input(s): CKTOTAL, CKMB, CKMBINDEX, TROPONINI in the last 168 hours. BNP (last 3 results) No results for input(s): PROBNP in the last 8760 hours. HbA1C: No results for input(s): HGBA1C in the last 72 hours. CBG: No results for input(s): GLUCAP in the last 168 hours. Lipid Profile: No results for input(s): CHOL, HDL, LDLCALC, TRIG, CHOLHDL, LDLDIRECT in the last 72 hours. Thyroid Function Tests: No results for input(s): TSH, T4TOTAL, FREET4, T3FREE, THYROIDAB in the last 72 hours. Anemia Panel: No results for input(s): VITAMINB12, FOLATE, FERRITIN, TIBC, IRON, RETICCTPCT in the last 72 hours. Sepsis Labs: No results for input(s): PROCALCITON, LATICACIDVEN in the last 168 hours.  Recent Results (from the past 240 hour(s))  SARS CORONAVIRUS 2 (TAT 6-24 HRS) Nasopharyngeal Nasopharyngeal Swab     Status: None   Collection Time: 07/12/19  6:33 PM   Specimen: Nasopharyngeal Swab  Result Value Ref Range Status   SARS Coronavirus 2 NEGATIVE NEGATIVE Final    Comment: (NOTE) SARS-CoV-2 target nucleic acids are NOT DETECTED. The SARS-CoV-2 RNA is generally detectable in upper and lower respiratory specimens during the acute phase of infection. Negative results do not preclude SARS-CoV-2 infection, do not rule out co-infections with other pathogens, and should not be used as the sole basis for treatment or other patient management decisions. Negative results must be combined with clinical observations, patient history, and epidemiological information. The expected result is Negative. Fact Sheet for Patients: SugarRoll.be Fact Sheet for Healthcare  Providers: https://www.woods-mathews.com/ This test is not yet approved or cleared by the [place] FDA and  has been authorized for detection and/or diagnosis of SARS-CoV-2 by FDA under an Emergency Use Authorization (EUA). This EUA will remain  in effect (meaning this test can be used) for the duration of the COVID-19 declaration under Section 56 4(b)(1) of the Act, 21 U.S.C. section 360bbb-3(b)(1), unless the authorization is terminated or revoked sooner. Performed at [facility], [place] [address] [place], [place] [address]   Surgical pcr screen     Status: None   Collection Time: 07/13/19 12:22 AM   Specimen: Nasal Mucosa; Nasal Swab  Result Value Ref Range Status   MRSA, PCR NEGATIVE NEGATIVE Final   Staphylococcus aureus NEGATIVE NEGATIVE Final    Comment: (NOTE) The Xpert SA Assay (FDA approved for NASAL specimens in patients 36 years of age and older), is one component of a comprehensive surveillance program. It is not intended to diagnose infection nor to guide or monitor treatment. Performed at [facility], [place] [address], [place], [place] [address]          Radiology Studies: DG Chest 1 View  Result Date: 07/12/2019 CLINICAL DATA:  Fall, unwitnessed, yesterday EXAM: CHEST  1 VIEW COMPARISON:  11/14/2017 chest radiograph. FINDINGS: Stable cardiomediastinal silhouette with normal heart size. No pneumothorax. No pleural  effusion. Lungs appear clear, with no acute consolidative airspace disease and no pulmonary edema. IMPRESSION: No active disease. Electronically Signed   By: Ilona Sorrel M.D.   On: 07/12/2019 17:47   CT Head Wo Contrast  Result Date: 07/12/2019 CLINICAL DATA:  86 year old female with headache and neck pain following fall yesterday. Initial encounter. EXAM: CT HEAD WITHOUT CONTRAST CT CERVICAL SPINE WITHOUT CONTRAST TECHNIQUE: Multidetector CT imaging of the head and cervical spine was performed following the standard protocol  without intravenous contrast. Multiplanar CT image reconstructions of the cervical spine were also generated. COMPARISON:  11/14/2017 CT FINDINGS: CT HEAD FINDINGS Brain: No evidence of acute infarction, hemorrhage, hydrocephalus, extra-axial collection or mass lesion/mass effect. Atrophy and chronic small-vessel white matter ischemic changes again noted. Vascular: Carotid atherosclerotic calcifications are noted. Skull: Normal. Negative for fracture or focal lesion. Sinuses/Orbits: No acute finding. Other: None. CT CERVICAL SPINE FINDINGS Alignment: Normal. Skull base and vertebrae: No acute fracture. No primary bone lesion or focal pathologic process. Soft tissues and spinal canal: No prevertebral fluid or swelling. No visible canal hematoma. Disc levels: Mild multilevel degenerative disc disease/spondylosis, greatest at C5-C6 again noted. Mild to moderate multilevel facet arthropathy again identified. Upper chest: No acute abnormality. Other: None IMPRESSION: 1. No evidence of acute intracranial abnormality. Atrophy and chronic small-vessel white matter ischemic changes. 2. No static evidence of acute injury to the cervical spine. Electronically Signed   By: Margarette Canada M.D.   On: 07/12/2019 17:30   CT Cervical Spine Wo Contrast  Result Date: 07/12/2019 CLINICAL DATA:  86 year old female with headache and neck pain following fall yesterday. Initial encounter. EXAM: CT HEAD WITHOUT CONTRAST CT CERVICAL SPINE WITHOUT CONTRAST TECHNIQUE: Multidetector CT imaging of the head and cervical spine was performed following the standard protocol without intravenous contrast. Multiplanar CT image reconstructions of the cervical spine were also generated. COMPARISON:  11/14/2017 CT FINDINGS: CT HEAD FINDINGS Brain: No evidence of acute infarction, hemorrhage, hydrocephalus, extra-axial collection or mass lesion/mass effect. Atrophy and chronic small-vessel white matter ischemic changes again noted. Vascular: Carotid  atherosclerotic calcifications are noted. Skull: Normal. Negative for fracture or focal lesion. Sinuses/Orbits: No acute finding. Other: None. CT CERVICAL SPINE FINDINGS Alignment: Normal. Skull base and vertebrae: No acute fracture. No primary bone lesion or focal pathologic process. Soft tissues and spinal canal: No prevertebral fluid or swelling. No visible canal hematoma. Disc levels: Mild multilevel degenerative disc disease/spondylosis, greatest at C5-C6 again noted. Mild to moderate multilevel facet arthropathy again identified. Upper chest: No acute abnormality. Other: None IMPRESSION: 1. No evidence of acute intracranial abnormality. Atrophy and chronic small-vessel white matter ischemic changes. 2. No static evidence of acute injury to the cervical spine. Electronically Signed   By: Margarette Canada M.D.   On: 07/12/2019 17:30   DG Hip Unilat W or Wo Pelvis 2-3 Views Left  Result Date: 07/12/2019 CLINICAL DATA:  Unwitnessed fall yesterday EXAM: DG HIP (WITH OR WITHOUT PELVIS) 2-3V LEFT COMPARISON:  11/14/2017 left hip radiographs FINDINGS: Subcapital left femoral neck fracture with 2 cm over riding of the fracture fragments and 2 cm lateral displacement of the distal fracture fragment. No dislocation of the left femoral head fracture fragment at the left hip joint. No additional fractures. No pelvic diastasis. No focal osseous lesions. IMPRESSION: Displaced subcapital left femoral neck fracture as detailed. Electronically Signed   By: Ilona Sorrel M.D.   On: 07/12/2019 17:49        Scheduled Meds: . amLODipine  2.5 mg Oral Daily  .  vitamin C  1,000 mg Oral Daily  . carbidopa-levodopa  2.5 tablet Oral TID WC  . cholecalciferol  5,000 Units Oral Daily  . donepezil  5 mg Oral QHS  . heparin  5,000 Units Subcutaneous Q8H  . multivitamin  1 tablet Oral BID  . polyethylene glycol  17 g Oral Daily  . pravastatin  40 mg Oral Daily   Continuous Infusions: . sodium chloride 75 mL/hr at 07/13/19  0140     LOS: 1 day    Time spent: 35 mins.More than 50% of that time was spent in counseling and/or coordination of care.      Shelly Coss, MD [facility] P3/15/2021, 8:12 AM

## 2019-07-13 NOTE — Anesthesia Preprocedure Evaluation (Addendum)
Anesthesia Evaluation  Patient identified by MRN, date of birth, ID bandGeneral Assessment Comment:Awake and responsive  Reviewed: Allergy & Precautions, NPO status , Patient's Chart, lab work & pertinent test results  Airway Mallampati: III  TM Distance: >3 FB Neck ROM: Full    Dental no notable dental hx.    Pulmonary neg pulmonary ROS,    Pulmonary exam normal breath sounds clear to auscultation       Cardiovascular hypertension, Pt. on medications + CAD and + Past MI  Normal cardiovascular exam Rhythm:Regular Rate:Normal  ECG: SR, rate 92  Sees cardiologist in [place]   Neuro/Psych PSYCHIATRIC DISORDERS Depression Dementia negative neurological ROS     GI/Hepatic negative GI ROS, Neg liver ROS,   Endo/Other  negative endocrine ROS  Renal/GU negative Renal ROS     Musculoskeletal negative musculoskeletal ROS (+)   Abdominal   Peds  Hematology HLD   Anesthesia Other Findings left hip fracture  Reproductive/Obstetrics                           Anesthesia Physical Anesthesia Plan  ASA: III  Anesthesia Plan: Spinal   Post-op Pain Management:    Induction:   PONV Risk Score and Plan: 2 and Ondansetron, Dexamethasone, Propofol infusion and Treatment may vary due to age or medical condition  Airway Management Planned: Simple Face Mask  Additional Equipment:   Intra-op Plan:   Post-operative Plan:   Informed Consent: I have reviewed the patients History and Physical, chart, labs and discussed the procedure including the risks, benefits and alternatives for the proposed anesthesia with the patient or authorized representative who has indicated his/her understanding and acceptance.   Patient has DNR.  Discussed DNR with power of attorney and Suspend DNR.   Dental advisory given and Consent reviewed with POA  Plan Discussed with: CRNA  Anesthesia Plan Comments: (Anesthetic plan  discussed with daughter )       Anesthesia Quick Evaluation

## 2019-07-13 NOTE — Anesthesia Postprocedure Evaluation (Signed)
Anesthesia Post Note  Patient: Helia Houseknecht  Procedure(s) Performed: LEFT HEMI-HIP ARTHROPLASTY ANTERIOR APPROACH (Left Hip)     Patient location during evaluation: PACU Anesthesia Type: Spinal Level of consciousness: awake Pain management: pain level controlled Vital Signs Assessment: post-procedure vital signs reviewed and stable Respiratory status: spontaneous breathing, respiratory function stable and patient connected to nasal cannula oxygen Cardiovascular status: blood pressure returned to baseline and stable Postop Assessment: no headache, no backache, no apparent nausea or vomiting and spinal receding Anesthetic complications: no    Last Vitals:  Vitals:   07/13/19 1620 07/13/19 1940  BP: 118/70 103/66  Pulse: 81 71  Resp: 16 16  Temp: 36.7 C 36.7 C  SpO2: 96% 97%    Last Pain:  Vitals:   07/13/19 1620  TempSrc: Oral  PainSc:                  Taiesha Bovard P Cassie Henkels

## 2019-07-13 NOTE — Plan of Care (Signed)
  Problem: Nutrition: Goal: Adequate nutrition will be maintained Outcome: Progressing   Problem: Coping: Goal: Level of anxiety will decrease Outcome: Progressing   Problem: Pain Managment: Goal: General experience of comfort will improve Outcome: Progressing   Problem: Safety: Goal: Ability to remain free from injury will improve Outcome: Progressing   

## 2019-07-13 NOTE — H&P (View-Only) (Signed)
Reason for Consult:Left hip fx Referring Physician: A Adhikari  Kaitlyn Ortiz is an 86 y.o. female.  HPI: Kaitlyn Ortiz fell at the [facility] where she [facility] in a memory care unit. She seemed ok the day she fell and was put to bed but c/o left hip pain and inability to ambulate the next morning. X-rays showed a femoral neck fx and she was transported to [facility] and then to [facility] for definitive care of her fx. She is unable to contribute to history.  Past Medical History:  Diagnosis Date  . Bronchiectasis ([place])   . Cognitive changes    mild  . Dissecting aortic aneurysm (any part), thoracic ([facility])    couple of years ago per daughter.  . Glaucoma   . Heart attack ([place])    mild  . Hypertension   . Incontinence     Past Surgical History:  Procedure Laterality Date  . CATARACT EXTRACTION  1994  . CATARACT EXTRACTION  2006    Family History  Problem Relation Age of Onset  . Stroke Father     Social History:  reports that she has never smoked. She has never used smokeless tobacco. She reports that she does not drink alcohol or use drugs.  Allergies:  Allergies  Allergen Reactions  . Caffeine     Medications: I have reviewed the patient's current medications.  Results for orders placed or performed during the hospital encounter of 07/12/19 (from the past 48 hour(s))  Comprehensive metabolic panel     Status: Abnormal   Collection Time: 07/12/19  4:53 PM  Result Value Ref Range   Sodium 142 135 - 145 mmol/L   Potassium 3.6 3.5 - 5.1 mmol/L   Chloride 107 98 - 111 mmol/L   CO2 27 22 - 32 mmol/L   Glucose, Bld 141 (H) 70 - 99 mg/dL    Comment: Glucose reference range applies only to samples taken after fasting for at least 8 hours.   BUN 23 8 - 23 mg/dL   Creatinine, Ser 0.91 0.44 - 1.00 mg/dL   Calcium 9.1 8.9 - 10.3 mg/dL   Total Protein 7.5 6.5 - 8.1 g/dL   Albumin 4.3 3.5 - 5.0 g/dL   AST 34 15 - 41 U/L   ALT 18 0 - 44 U/L   Alkaline Phosphatase 51 38 - 126 U/L   Total Bilirubin  0.9 0.3 - 1.2 mg/dL   GFR calc non Af Amer 57 (L) >60 mL/min   GFR calc Af Amer >60 >60 mL/min   Anion gap 8 5 - 15    Comment: Performed at [facility], [place] [address], [place], [place] [address]  CBC with Differential     Status: Abnormal   Collection Time: 07/12/19  4:53 PM  Result Value Ref Range   WBC 10.3 4.0 - 10.5 K/uL   RBC 4.58 3.87 - 5.11 MIL/uL   Hemoglobin 13.6 12.0 - 15.0 g/dL   HCT 42.3 36.0 - 46.0 %   MCV 92.4 80.0 - 100.0 fL   MCH 29.7 26.0 - 34.0 pg   MCHC 32.2 30.0 - 36.0 g/dL   RDW 15.4 11.5 - 15.5 %   Platelets 180 150 - 400 K/uL   nRBC 0.0 0.0 - 0.2 %   Neutrophils Relative % 88 %   Neutro Abs 9.0 (H) 1.7 - 7.7 K/uL   Lymphocytes Relative 5 %   Lymphs Abs 0.5 (L) 0.7 - 4.0 K/uL   Monocytes Relative 6 %  Monocytes Absolute 0.6 0.1 - 1.0 K/uL   Eosinophils Relative 0 %   Eosinophils Absolute 0.0 0.0 - 0.5 K/uL   Basophils Relative 0 %   Basophils Absolute 0.0 0.0 - 0.1 K/uL   Immature Granulocytes 1 %   Abs Immature Granulocytes 0.06 0.00 - 0.07 K/uL    Comment: Performed at [facility], [place] [address], [place], [place] [address]  Protime-INR     Status: None   Collection Time: 07/12/19  4:53 PM  Result Value Ref Range   Prothrombin Time 12.9 11.4 - 15.2 seconds   INR 1.0 0.8 - 1.2    Comment: (NOTE) INR goal varies based on device and disease states. Performed at [facility], [place] [address], [place], [place] [address]   ABO/Rh     Status: None   Collection Time: 07/12/19  4:53 PM  Result Value Ref Range   ABO/RH(D)      Jenetta Downer NEG Performed at [place] [address], [place], [place] [address]   Type and screen [place]     Status: None   Collection Time: 07/12/19  6:00 PM  Result Value Ref Range   ABO/RH(D) O NEG    Antibody Screen NEG    Sample Expiration      07/15/2019,2359 Performed at [facility], [place] [address], [place], [place] [address]   SARS CORONAVIRUS 2 (TAT 6-24 HRS) Nasopharyngeal Nasopharyngeal Swab     Status: None   Collection Time: 07/12/19  6:33 PM   Specimen: Nasopharyngeal Swab  Result Value Ref Range   SARS Coronavirus 2 NEGATIVE NEGATIVE    Comment: (NOTE) SARS-CoV-2 target nucleic acids are NOT DETECTED. The SARS-CoV-2 RNA is generally detectable in upper and lower respiratory specimens during the acute phase of infection. Negative results do not preclude SARS-CoV-2 infection, do not rule out co-infections with other pathogens, and should not be used as the sole basis for treatment or other patient management decisions. Negative results must be combined with clinical observations, patient history, and epidemiological information. The expected result is Negative. Fact Sheet for Patients: SugarRoll.be Fact Sheet for Healthcare Providers: https://www.woods-mathews.com/ This test is not yet approved or cleared by the [place] FDA and  has been authorized for detection and/or diagnosis of SARS-CoV-2 by FDA under an Emergency Use Authorization (EUA). This EUA will remain  in effect (meaning this test can be used) for the duration of the COVID-19 declaration under Section 56 4(b)(1) of the Act, 21 U.S.C. section 360bbb-3(b)(1), unless the authorization is terminated or revoked sooner. Performed at [facility], [place] [address] [place], [place] [address]   Urinalysis, Routine w reflex microscopic     Status: Abnormal   Collection Time: 07/12/19  8:05 PM  Result Value Ref Range   Color, Urine BROWN (A) YELLOW    Comment: BIOCHEMICALS MAY BE AFFECTED BY COLOR   APPearance TURBID (A) CLEAR   Specific Gravity, Urine 1.015 1.005 - 1.030   pH 8.5 (H) 5.0 - 8.0   Glucose, UA NEGATIVE NEGATIVE mg/dL   Hgb urine dipstick LARGE (A) NEGATIVE   Bilirubin Urine MODERATE (A) NEGATIVE   Ketones, ur TRACE (A) NEGATIVE mg/dL   Protein, ur  100 (A) NEGATIVE mg/dL   Nitrite POSITIVE (A) NEGATIVE   Leukocytes,Ua MODERATE (A) NEGATIVE    Comment: Performed at [facility], [place] [address], [place], [place] [address]  Urinalysis, Microscopic (reflex)  Status: Abnormal   Collection Time: 07/12/19  8:05 PM  Result Value Ref Range   RBC / HPF 0-5 0 - 5 RBC/hpf   WBC, UA 0-5 0 - 5 WBC/hpf   Bacteria, UA MANY (A) NONE SEEN   Squamous Epithelial / LPF NONE SEEN 0 - 5   Triple Phosphate Crystal PRESENT     Comment: Performed at [facility], [place] [address] [place], [place] [address]  Surgical pcr screen     Status: None   Collection Time: 07/13/19 12:22 AM   Specimen: Nasal Mucosa; Nasal Swab  Result Value Ref Range   MRSA, PCR NEGATIVE NEGATIVE   Staphylococcus aureus NEGATIVE NEGATIVE    Comment: (NOTE) The Xpert SA Assay (FDA approved for NASAL specimens in patients 1 years of age and older), is one component of a comprehensive surveillance program. It is not intended to diagnose infection nor to guide or monitor treatment. Performed at [facility], [place] [address] [place], [place] [id number]   Basic metabolic panel     Status: Abnormal   Collection Time: 07/13/19  3:02 AM  Result Value Ref Range   Sodium 142 135 - 145 mmol/L   Potassium 3.6 3.5 - 5.1 mmol/L   Chloride 107 98 - 111 mmol/L   CO2 25 22 - 32 mmol/L   Glucose, Bld 137 (H) 70 - 99 mg/dL    Comment: Glucose reference range applies only to samples taken after fasting for at least 8 hours.   BUN 20 8 - 23 mg/dL   Creatinine, Ser 0.88 0.44 - 1.00 mg/dL   Calcium 8.3 (L) 8.9 - 10.3 mg/dL   GFR calc non Af Amer 59 (L) >60 mL/min   GFR calc Af Amer >60 >60 mL/min   Anion gap 10 5 - 15    Comment: Performed at [place] [address], [place], [place] [address]  CBC     Status: Abnormal   Collection Time: 07/13/19  3:02 AM  Result Value Ref Range   WBC 9.9 4.0 - 10.5 K/uL   RBC 4.18 3.87 - 5.11 MIL/uL    Hemoglobin 12.5 12.0 - 15.0 g/dL   HCT 37.9 36.0 - 46.0 %   MCV 90.7 80.0 - 100.0 fL   MCH 29.9 26.0 - 34.0 pg   MCHC 33.0 30.0 - 36.0 g/dL   RDW 15.6 (H) 11.5 - 15.5 %   Platelets 177 150 - 400 K/uL   nRBC 0.0 0.0 - 0.2 %    Comment: Performed at [facility], [place] [address] [place], [place] [address]    DG Chest 1 View  Result Date: 07/12/2019 CLINICAL DATA:  Fall, unwitnessed, yesterday EXAM: CHEST  1 VIEW COMPARISON:  11/14/2017 chest radiograph. FINDINGS: Stable cardiomediastinal silhouette with normal heart size. No pneumothorax. No pleural effusion. Lungs appear clear, with no acute consolidative airspace disease and no pulmonary edema. IMPRESSION: No active disease. Electronically Signed   By: Ilona Sorrel M.D.   On: 07/12/2019 17:47   CT Head Wo Contrast  Result Date: 07/12/2019 CLINICAL DATA:  86 year old female with headache and neck pain following fall yesterday. Initial encounter. EXAM: CT HEAD WITHOUT CONTRAST CT CERVICAL SPINE WITHOUT CONTRAST TECHNIQUE: Multidetector CT imaging of the head and cervical spine was performed following the standard protocol without intravenous contrast. Multiplanar CT image reconstructions of the cervical spine were also generated. COMPARISON:  11/14/2017 CT FINDINGS: CT HEAD FINDINGS Brain: No evidence of acute infarction,  hemorrhage, hydrocephalus, extra-axial collection or mass lesion/mass effect. Atrophy and chronic small-vessel white matter ischemic changes again noted. Vascular: Carotid atherosclerotic calcifications are noted. Skull: Normal. Negative for fracture or focal lesion. Sinuses/Orbits: No acute finding. Other: None. CT CERVICAL SPINE FINDINGS Alignment: Normal. Skull base and vertebrae: No acute fracture. No primary bone lesion or focal pathologic process. Soft tissues and spinal canal: No prevertebral fluid or swelling. No visible canal hematoma. Disc levels: Mild multilevel degenerative disc disease/spondylosis, greatest at  C5-C6 again noted. Mild to moderate multilevel facet arthropathy again identified. Upper chest: No acute abnormality. Other: None IMPRESSION: 1. No evidence of acute intracranial abnormality. Atrophy and chronic small-vessel white matter ischemic changes. 2. No static evidence of acute injury to the cervical spine. Electronically Signed   By: Margarette Canada M.D.   On: 07/12/2019 17:30   CT Cervical Spine Wo Contrast  Result Date: 07/12/2019 CLINICAL DATA:  86 year old female with headache and neck pain following fall yesterday. Initial encounter. EXAM: CT HEAD WITHOUT CONTRAST CT CERVICAL SPINE WITHOUT CONTRAST TECHNIQUE: Multidetector CT imaging of the head and cervical spine was performed following the standard protocol without intravenous contrast. Multiplanar CT image reconstructions of the cervical spine were also generated. COMPARISON:  11/14/2017 CT FINDINGS: CT HEAD FINDINGS Brain: No evidence of acute infarction, hemorrhage, hydrocephalus, extra-axial collection or mass lesion/mass effect. Atrophy and chronic small-vessel white matter ischemic changes again noted. Vascular: Carotid atherosclerotic calcifications are noted. Skull: Normal. Negative for fracture or focal lesion. Sinuses/Orbits: No acute finding. Other: None. CT CERVICAL SPINE FINDINGS Alignment: Normal. Skull base and vertebrae: No acute fracture. No primary bone lesion or focal pathologic process. Soft tissues and spinal canal: No prevertebral fluid or swelling. No visible canal hematoma. Disc levels: Mild multilevel degenerative disc disease/spondylosis, greatest at C5-C6 again noted. Mild to moderate multilevel facet arthropathy again identified. Upper chest: No acute abnormality. Other: None IMPRESSION: 1. No evidence of acute intracranial abnormality. Atrophy and chronic small-vessel white matter ischemic changes. 2. No static evidence of acute injury to the cervical spine. Electronically Signed   By: Margarette Canada M.D.   On: 07/12/2019  17:30   DG Hip Unilat W or Wo Pelvis 2-3 Views Left  Result Date: 07/12/2019 CLINICAL DATA:  Unwitnessed fall yesterday EXAM: DG HIP (WITH OR WITHOUT PELVIS) 2-3V LEFT COMPARISON:  11/14/2017 left hip radiographs FINDINGS: Subcapital left femoral neck fracture with 2 cm over riding of the fracture fragments and 2 cm lateral displacement of the distal fracture fragment. No dislocation of the left femoral head fracture fragment at the left hip joint. No additional fractures. No pelvic diastasis. No focal osseous lesions. IMPRESSION: Displaced subcapital left femoral neck fracture as detailed. Electronically Signed   By: Ilona Sorrel M.D.   On: 07/12/2019 17:49    Review of Systems  Unable to perform ROS: Dementia   Blood pressure (!) 144/83, pulse 82, temperature 98.6 F (37 C), temperature source Oral, resp. rate 16, SpO2 93 %. Physical Exam  Constitutional: She appears well-developed and well-nourished. No distress.  HENT:  Head: Normocephalic and atraumatic.  Eyes: Conjunctivae are normal. Right eye exhibits no discharge. Left eye exhibits no discharge. No scleral icterus.  Cardiovascular: Normal rate and regular rhythm.  Respiratory: Effort normal. No respiratory distress.  Musculoskeletal:     Cervical back: Normal range of motion.     Comments: LLE No traumatic wounds, ecchymosis, or rash  Mild TTP hip  No knee or ankle effusion  Knee stable to varus/ valgus and anterior/posterior stress  Sens DPN, SPN, TN intact  Motor EHL, ext, flex, evers grossly intact  DP 2+, PT 1+, No significant edema  Neurological: She is alert.  Skin: Skin is warm and dry. She is not diaphoretic.  Psychiatric: She has a normal mood and affect. Her behavior is normal.    Assessment/Plan: Left hip fx -- Plan hip hemi today by Dr. Lyla Glassing. Please keep NPO. Multiple medical problems including Parkinson's disease with memory deficit, CAD, HTN, Glaucoma, and Bronchiectasis -- per primary  service    Lisette Abu, PA-C Orthopedic Surgery [phone] 07/13/2019, 9:23 AM

## 2019-07-13 NOTE — Consult Note (Signed)
Reason for Consult:Left hip fx Referring Physician: A Adhikari  Kaitlyn Ortiz is an 86 y.o. female.  HPI: Zen[place] fell at the SNF where she resides in a memory care unit. She seemed ok the day she fell and was put to bed but c/o left hip pain and inability to ambulate the next morning. X-rays showed a femoral neck fx and she was transported to [facility] and then to [facility] for definitive care of her fx. She is unable to contribute to history.  Past Medical History:  Diagnosis Date  . Bronchiectasis ([place])   . Cognitive changes    mild  . Dissecting aortic aneurysm (any part), thoracic (HCC)    couple of years ago per daughter.  . Glaucoma   . Heart attack ([place])    mild  . Hypertension   . Incontinence     Past Surgical History:  Procedure Laterality Date  . CATARACT EXTRACTION  1994  . CATARACT EXTRACTION  2006    Family History  Problem Relation Age of Onset  . Stroke Father     Social History:  reports that she has never smoked. She has never used smokeless tobacco. She reports that she does not drink alcohol or use drugs.  Allergies:  Allergies  Allergen Reactions  . Caffeine     Medications: I have reviewed the patient's current medications.  Results for orders placed or performed during the hospital encounter of 07/12/19 (from the past 48 hour(s))  Comprehensive metabolic panel     Status: Abnormal   Collection Time: 07/12/19  4:53 PM  Result Value Ref Range   Sodium 142 135 - 145 mmol/L   Potassium 3.6 3.5 - 5.1 mmol/L   Chloride 107 98 - 111 mmol/L   CO2 27 22 - 32 mmol/L   Glucose, Bld 141 (H) 70 - 99 mg/dL    Comment: Glucose reference range applies only to samples taken after fasting for at least 8 hours.   BUN 23 8 - 23 mg/dL   Creatinine, Ser 0.91 0.44 - 1.00 mg/dL   Calcium 9.1 8.9 - 10.3 mg/dL   Total Protein 7.5 6.5 - 8.1 g/dL   Albumin 4.3 3.5 - 5.0 g/dL   AST 34 15 - 41 U/L   ALT 18 0 - 44 U/L   Alkaline Phosphatase 51 38 - 126 U/L   Total Bilirubin  0.9 0.3 - 1.2 mg/dL   GFR calc non Af Amer 57 (L) >60 mL/min   GFR calc Af Amer >60 >60 mL/min   Anion gap 8 5 - 15    Comment: Performed at [facility], [place] [address], [place], [place] [address]  CBC with Differential     Status: Abnormal   Collection Time: 07/12/19  4:53 PM  Result Value Ref Range   WBC 10.3 4.0 - 10.5 K/uL   RBC 4.58 3.87 - 5.11 MIL/uL   Hemoglobin 13.6 12.0 - 15.0 g/dL   HCT 42.3 36.0 - 46.0 %   MCV 92.4 80.0 - 100.0 fL   MCH 29.7 26.0 - 34.0 pg   MCHC 32.2 30.0 - 36.0 g/dL   RDW 15.4 11.5 - 15.5 %   Platelets 180 150 - 400 K/uL   nRBC 0.0 0.0 - 0.2 %   Neutrophils Relative % 88 %   Neutro Abs 9.0 (H) 1.7 - 7.7 K/uL   Lymphocytes Relative 5 %   Lymphs Abs 0.5 (L) 0.7 - 4.0 K/uL   Monocytes Relative 6 %  Monocytes Absolute 0.6 0.1 - 1.0 K/uL   Eosinophils Relative 0 %   Eosinophils Absolute 0.0 0.0 - 0.5 K/uL   Basophils Relative 0 %   Basophils Absolute 0.0 0.0 - 0.1 K/uL   Immature Granulocytes 1 %   Abs Immature Granulocytes 0.06 0.00 - 0.07 K/uL    Comment: Performed at [facility], [place] [address], [place], [place] [address]  Protime-INR     Status: None   Collection Time: 07/12/19  4:53 PM  Result Value Ref Range   Prothrombin Time 12.9 11.4 - 15.2 seconds   INR 1.0 0.8 - 1.2    Comment: (NOTE) INR goal varies based on device and disease states. Performed at [facility], [place] [address] [place], [place] [address]   ABO/Rh     Status: None   Collection Time: 07/12/19  4:53 PM  Result Value Ref Range   ABO/RH(D)      Jenetta Downer NEG Performed at [place] [address] [place], [place] [address]   Type and screen Anthony     Status: None   Collection Time: 07/12/19  6:00 PM  Result Value Ref Range   ABO/RH(D) O NEG    Antibody Screen NEG    Sample Expiration      07/15/2019,2359 Performed at [facility], [place] [address], [place], [place] [address]   SARS CORONAVIRUS 2 (TAT 6-24 HRS) Nasopharyngeal Nasopharyngeal Swab     Status: None   Collection Time: 07/12/19  6:33 PM   Specimen: Nasopharyngeal Swab  Result Value Ref Range   SARS Coronavirus 2 NEGATIVE NEGATIVE    Comment: (NOTE) SARS-CoV-2 target nucleic acids are NOT DETECTED. The SARS-CoV-2 RNA is generally detectable in upper and lower respiratory specimens during the acute phase of infection. Negative results do not preclude SARS-CoV-2 infection, do not rule out co-infections with other pathogens, and should not be used as the sole basis for treatment or other patient management decisions. Negative results must be combined with clinical observations, patient history, and epidemiological information. The expected result is Negative. Fact Sheet for Patients: SugarRoll.be Fact Sheet for Healthcare Providers: https://www.woods-mathews.com/ This test is not yet approved or cleared by the [place] FDA and  has been authorized for detection and/or diagnosis of SARS-CoV-2 by FDA under an Emergency Use Authorization (EUA). This EUA will remain  in effect (meaning this test can be used) for the duration of the COVID-19 declaration under Section 56 4(b)(1) of the Act, 21 U.S.C. section 360bbb-3(b)(1), unless the authorization is terminated or revoked sooner. Performed at [facility], [place] [address] [place], [place] [address]   Urinalysis, Routine w reflex microscopic     Status: Abnormal   Collection Time: 07/12/19  8:05 PM  Result Value Ref Range   Color, Urine BROWN (A) YELLOW    Comment: BIOCHEMICALS MAY BE AFFECTED BY COLOR   APPearance TURBID (A) CLEAR   Specific Gravity, Urine 1.015 1.005 - 1.030   pH 8.5 (H) 5.0 - 8.0   Glucose, UA NEGATIVE NEGATIVE mg/dL   Hgb urine dipstick LARGE (A) NEGATIVE   Bilirubin Urine MODERATE (A) NEGATIVE   Ketones, ur TRACE (A) NEGATIVE mg/dL   Protein, ur  100 (A) NEGATIVE mg/dL   Nitrite POSITIVE (A) NEGATIVE   Leukocytes,Ua MODERATE (A) NEGATIVE    Comment: Performed at [facility], [place] [address] [place],  [address]  Urinalysis, Microscopic (reflex)  Status: Abnormal   Collection Time: 07/12/19  8:05 PM  Result Value Ref Range   RBC / HPF 0-5 0 - 5 RBC/hpf   WBC, UA 0-5 0 - 5 WBC/hpf   Bacteria, UA MANY (A) NONE SEEN   Squamous Epithelial / LPF NONE SEEN 0 - 5   Triple Phosphate Crystal PRESENT     Comment: Performed at [facility], [place] [address], [place], [place] [address]  Surgical pcr screen     Status: None   Collection Time: 07/13/19 12:22 AM   Specimen: Nasal Mucosa; Nasal Swab  Result Value Ref Range   MRSA, PCR NEGATIVE NEGATIVE   Staphylococcus aureus NEGATIVE NEGATIVE    Comment: (NOTE) The Xpert SA Assay (FDA approved for NASAL specimens in patients 74 years of age and older), is one component of a comprehensive surveillance program. It is not intended to diagnose infection nor to guide or monitor treatment. Performed at [facility], [place] [address], [place], [place] [id number]   Basic metabolic panel     Status: Abnormal   Collection Time: 07/13/19  3:02 AM  Result Value Ref Range   Sodium 142 135 - 145 mmol/L   Potassium 3.6 3.5 - 5.1 mmol/L   Chloride 107 98 - 111 mmol/L   CO2 25 22 - 32 mmol/L   Glucose, Bld 137 (H) 70 - 99 mg/dL    Comment: Glucose reference range applies only to samples taken after fasting for at least 8 hours.   BUN 20 8 - 23 mg/dL   Creatinine, Ser 0.88 0.44 - 1.00 mg/dL   Calcium 8.3 (L) 8.9 - 10.3 mg/dL   GFR calc non Af Amer 59 (L) >60 mL/min   GFR calc Af Amer >60 >60 mL/min   Anion gap 10 5 - 15    Comment: Performed at [place] [address], [place], [place] [address]  CBC     Status: Abnormal   Collection Time: 07/13/19  3:02 AM  Result Value Ref Range   WBC 9.9 4.0 - 10.5 K/uL   RBC 4.18 3.87 - 5.11 MIL/uL    Hemoglobin 12.5 12.0 - 15.0 g/dL   HCT 37.9 36.0 - 46.0 %   MCV 90.7 80.0 - 100.0 fL   MCH 29.9 26.0 - 34.0 pg   MCHC 33.0 30.0 - 36.0 g/dL   RDW 15.6 (H) 11.5 - 15.5 %   Platelets 177 150 - 400 K/uL   nRBC 0.0 0.0 - 0.2 %    Comment: Performed at [facility], [place] [address] [place], [place] [address]    DG Chest 1 View  Result Date: 07/12/2019 CLINICAL DATA:  Fall, unwitnessed, yesterday EXAM: CHEST  1 VIEW COMPARISON:  11/14/2017 chest radiograph. FINDINGS: Stable cardiomediastinal silhouette with normal heart size. No pneumothorax. No pleural effusion. Lungs appear clear, with no acute consolidative airspace disease and no pulmonary edema. IMPRESSION: No active disease. Electronically Signed   By: Ilona Sorrel M.D.   On: 07/12/2019 17:47   CT Head Wo Contrast  Result Date: 07/12/2019 CLINICAL DATA:  86 year old female with headache and neck pain following fall yesterday. Initial encounter. EXAM: CT HEAD WITHOUT CONTRAST CT CERVICAL SPINE WITHOUT CONTRAST TECHNIQUE: Multidetector CT imaging of the head and cervical spine was performed following the standard protocol without intravenous contrast. Multiplanar CT image reconstructions of the cervical spine were also generated. COMPARISON:  11/14/2017 CT FINDINGS: CT HEAD FINDINGS Brain: No evidence of acute infarction,  hemorrhage, hydrocephalus, extra-axial collection or mass lesion/mass effect. Atrophy and chronic small-vessel white matter ischemic changes again noted. Vascular: Carotid atherosclerotic calcifications are noted. Skull: Normal. Negative for fracture or focal lesion. Sinuses/Orbits: No acute finding. Other: None. CT CERVICAL SPINE FINDINGS Alignment: Normal. Skull base and vertebrae: No acute fracture. No primary bone lesion or focal pathologic process. Soft tissues and spinal canal: No prevertebral fluid or swelling. No visible canal hematoma. Disc levels: Mild multilevel degenerative disc disease/spondylosis, greatest at  C5-C6 again noted. Mild to moderate multilevel facet arthropathy again identified. Upper chest: No acute abnormality. Other: None IMPRESSION: 1. No evidence of acute intracranial abnormality. Atrophy and chronic small-vessel white matter ischemic changes. 2. No static evidence of acute injury to the cervical spine. Electronically Signed   By: Margarette Canada M.D.   On: 07/12/2019 17:30   CT Cervical Spine Wo Contrast  Result Date: 07/12/2019 CLINICAL DATA:  86 year old female with headache and neck pain following fall yesterday. Initial encounter. EXAM: CT HEAD WITHOUT CONTRAST CT CERVICAL SPINE WITHOUT CONTRAST TECHNIQUE: Multidetector CT imaging of the head and cervical spine was performed following the standard protocol without intravenous contrast. Multiplanar CT image reconstructions of the cervical spine were also generated. COMPARISON:  11/14/2017 CT FINDINGS: CT HEAD FINDINGS Brain: No evidence of acute infarction, hemorrhage, hydrocephalus, extra-axial collection or mass lesion/mass effect. Atrophy and chronic small-vessel white matter ischemic changes again noted. Vascular: Carotid atherosclerotic calcifications are noted. Skull: Normal. Negative for fracture or focal lesion. Sinuses/Orbits: No acute finding. Other: None. CT CERVICAL SPINE FINDINGS Alignment: Normal. Skull base and vertebrae: No acute fracture. No primary bone lesion or focal pathologic process. Soft tissues and spinal canal: No prevertebral fluid or swelling. No visible canal hematoma. Disc levels: Mild multilevel degenerative disc disease/spondylosis, greatest at C5-C6 again noted. Mild to moderate multilevel facet arthropathy again identified. Upper chest: No acute abnormality. Other: None IMPRESSION: 1. No evidence of acute intracranial abnormality. Atrophy and chronic small-vessel white matter ischemic changes. 2. No static evidence of acute injury to the cervical spine. Electronically Signed   By: Margarette Canada M.D.   On: 07/12/2019  17:30   DG Hip Unilat W or Wo Pelvis 2-3 Views Left  Result Date: 07/12/2019 CLINICAL DATA:  Unwitnessed fall yesterday EXAM: DG HIP (WITH OR WITHOUT PELVIS) 2-3V LEFT COMPARISON:  11/14/2017 left hip radiographs FINDINGS: Subcapital left femoral neck fracture with 2 cm over riding of the fracture fragments and 2 cm lateral displacement of the distal fracture fragment. No dislocation of the left femoral head fracture fragment at the left hip joint. No additional fractures. No pelvic diastasis. No focal osseous lesions. IMPRESSION: Displaced subcapital left femoral neck fracture as detailed. Electronically Signed   By: Ilona Sorrel M.D.   On: 07/12/2019 17:49    Review of Systems  Unable to perform ROS: Dementia   Blood pressure (!) 144/83, pulse 82, temperature 98.6 F (37 C), temperature source Oral, resp. rate 16, SpO2 93 %. Physical Exam  Constitutional: She appears well-developed and well-nourished. No distress.  HENT:  Head: Normocephalic and atraumatic.  Eyes: Conjunctivae are normal. Right eye exhibits no discharge. Left eye exhibits no discharge. No scleral icterus.  Cardiovascular: Normal rate and regular rhythm.  Respiratory: Effort normal. No respiratory distress.  Musculoskeletal:     Cervical back: Normal range of motion.     Comments: LLE No traumatic wounds, ecchymosis, or rash  Mild TTP hip  No knee or ankle effusion  Knee stable to varus/ valgus and anterior/posterior stress  Sens DPN, SPN, TN intact  Motor EHL, ext, flex, evers grossly intact  DP 2+, PT 1+, No significant edema  Neurological: She is alert.  Skin: Skin is warm and dry. She is not diaphoretic.  Psychiatric: She has a normal mood and affect. Her behavior is normal.    Assessment/Plan: Left hip fx -- Plan hip hemi today by Dr. Lyla Glassing. Please keep NPO. Multiple medical problems including Parkinson's disease with memory deficit, CAD, HTN, Glaucoma, and Bronchiectasis -- per primary  service    Lisette Abu, PA-C Orthopedic Surgery [phone] 07/13/2019, 9:23 AM

## 2019-07-14 ENCOUNTER — Encounter (HOSPITAL_COMMUNITY): Payer: Self-pay | Admitting: Internal Medicine

## 2019-07-14 LAB — CBC WITH DIFFERENTIAL/PLATELET
Abs Immature Granulocytes: 0.03 10*3/uL (ref 0.00–0.07)
Basophils Absolute: 0 10*3/uL (ref 0.0–0.1)
Basophils Relative: 0 %
Eosinophils Absolute: 0.1 10*3/uL (ref 0.0–0.5)
Eosinophils Relative: 1 %
HCT: 30 % — ABNORMAL LOW (ref 36.0–46.0)
Hemoglobin: 9.8 g/dL — ABNORMAL LOW (ref 12.0–15.0)
Immature Granulocytes: 1 %
Lymphocytes Relative: 9 %
Lymphs Abs: 0.6 10*3/uL — ABNORMAL LOW (ref 0.7–4.0)
MCH: 30.1 pg (ref 26.0–34.0)
MCHC: 32.7 g/dL (ref 30.0–36.0)
MCV: 92 fL (ref 80.0–100.0)
Monocytes Absolute: 0.5 10*3/uL (ref 0.1–1.0)
Monocytes Relative: 8 %
Neutro Abs: 5.4 10*3/uL (ref 1.7–7.7)
Neutrophils Relative %: 81 %
Platelets: 135 10*3/uL — ABNORMAL LOW (ref 150–400)
RBC: 3.26 MIL/uL — ABNORMAL LOW (ref 3.87–5.11)
RDW: 15.8 % — ABNORMAL HIGH (ref 11.5–15.5)
WBC: 6.6 10*3/uL (ref 4.0–10.5)
nRBC: 0 % (ref 0.0–0.2)

## 2019-07-14 LAB — BASIC METABOLIC PANEL
Anion gap: 10 (ref 5–15)
BUN: 23 mg/dL (ref 8–23)
CO2: 24 mmol/L (ref 22–32)
Calcium: 7.8 mg/dL — ABNORMAL LOW (ref 8.9–10.3)
Chloride: 107 mmol/L (ref 98–111)
Creatinine, Ser: 0.83 mg/dL (ref 0.44–1.00)
GFR calc Af Amer: >60 mL/min (ref >60)
GFR calc non Af Amer: >60 mL/min (ref >60)
Glucose, Bld: 121 mg/dL — ABNORMAL HIGH (ref 70–99)
Potassium: 3.6 mmol/L (ref 3.5–5.1)
Sodium: 141 mmol/L (ref 135–145)

## 2019-07-14 MED ORDER — ASPIRIN 81 MG PO CHEW: 81.0000 mg | CHEWABLE_TABLET | Freq: Two times a day (BID) | ORAL | 0 refills | Status: AC

## 2019-07-14 MED ORDER — HYDROCODONE-ACETAMINOPHEN 5-325 MG PO TABS: 1.0000 | ORAL_TABLET | ORAL | 0 refills | Status: DC | PRN

## 2019-07-14 NOTE — Evaluation (Signed)
Physical Therapy Evaluation Patient Details Name: Kaitlyn Ortiz MRN: [id number] DOB: 1931/07/17 Today's Date: 07/14/2019   History of Present Illness  86 yo resident of [facility] memory care admitted after fall with left hip fx s/p anterior hemiarthroplasty. PMHx: Parkinson's, CAD, HTN, glaucoma, bronchiecstasis  Clinical Impression  Pt awake with RN taking medications and spitting them out despite repeated attempts. Pt noted to be incontinent of stool with assist for pericare in rolling with transition to [facility] with pt able to have BM and assist in standing for pericare. Pt pleasantly confused with difficulty following commands for mobility with 2 person assist to stand and pivot this session. Pt with decreased strength, cognition, balance, safety and transfers who will benefit from acute therapy to maximize mobility and function. Pt tolerating all movement of LLE without notable pain or grimace and requires assist to move leg.     Follow Up Recommendations SNF;Supervision/Assistance - 24 hour    Equipment Recommendations  Wheelchair (measurements PT)    Recommendations for Other Services OT consult     Precautions / Restrictions Precautions Precautions: Fall Restrictions Weight Bearing Restrictions: Yes LLE Weight Bearing: Weight bearing as tolerated      Mobility  Bed Mobility Overal bed mobility: Needs Assistance Bed Mobility: Rolling;Sidelying to Sit Rolling: Mod assist Sidelying to sit: Mod assist       General bed mobility comments: cues for sequence with assist to roll right for pericare due to incontinent BM, assist to bring legs off bed and elevate trunk into sitting.  Transfers Overall transfer level: Needs assistance   Transfers: Sit to/from Stand;Stand Pivot Transfers Sit to Stand: Mod assist;+2 physical assistance Stand pivot transfers: Max assist;+2 physical assistance       General transfer comment: mod +2 assist with cues for hand placement and knees  blocked to rise from bed and BSC. Max +2 assist to pivot from bed to [facility] and transition from [facility] to recliner with seats switched behind pt. Bil UE hooking therapist and tech to stand for max assist for pericare  Ambulation/Gait             General Gait Details: unable  Stairs            Wheelchair Mobility    Modified Rankin (Stroke Patients Only)       Balance Overall balance assessment: Needs assistance;History of Falls Sitting-balance support: Feet supported;No upper extremity supported Sitting balance-Leahy Scale: Fair     Standing balance support: Bilateral upper extremity supported Standing balance-Leahy Scale: Poor                               Pertinent Vitals/Pain Pain Assessment: No/denies pain    Home Living Family/patient expects to be discharged to:: Skilled nursing facility                      Prior Function Level of Independence: Independent with assistive device(s)         Comments: per prior admission walks with walker, meals in dining hall, assist for bathing and medication. Pt unable to provide PLOf     Hand Dominance        Extremity/Trunk Assessment   Upper Extremity Assessment Upper Extremity Assessment: Generalized weakness    Lower Extremity Assessment Lower Extremity Assessment: Generalized weakness    Cervical / Trunk Assessment Cervical / Trunk Assessment: Kyphotic  Communication      Cognition Arousal/Alertness: Awake/alert Behavior During Therapy:  Flat affect Overall Cognitive Status: No family/caregiver present to determine baseline cognitive functioning                                 General Comments: pt with history of dementia living in memory care facility. pt able to follow single step commands inconsistently with increased time. Asking about the babies and spitting out pils with RN present      General Comments      Exercises General Exercises - Lower  Extremity Long Arc Quad: AAROM;Left;Seated;10 reps Heel Slides: AAROM;Left;Supine;5 reps Hip Flexion/Marching: AAROM;Left;Seated;10 reps   Assessment/Plan    PT Assessment Patient needs continued PT services  PT Problem List Decreased strength;Decreased mobility;Decreased safety awareness;Decreased coordination;Decreased activity tolerance;Decreased balance;Decreased knowledge of use of DME;Decreased cognition       PT Treatment Interventions Gait training;DME instruction;Therapeutic exercise;Balance training;Functional mobility training;Cognitive remediation;Therapeutic activities;Patient/family education    PT Goals (Current goals can be found in the Care Plan section)  Acute Rehab PT Goals PT Goal Formulation: Patient unable to participate in goal setting Time For Goal Achievement: 07/28/19 Potential to Achieve Goals: Fair    Frequency Min 3X/week   Barriers to discharge Decreased caregiver support      Co-evaluation               AM-PAC PT "6 Clicks" Mobility  Outcome Measure Help needed turning from your back to your side while in a flat bed without using bedrails?: A Lot Help needed moving from lying on your back to sitting on the side of a flat bed without using bedrails?: A Lot Help needed moving to and from a bed to a chair (including a wheelchair)?: Total Help needed standing up from a chair using your arms (e.g., wheelchair or bedside chair)?: A Lot Help needed to walk in hospital room?: Total Help needed climbing 3-5 steps with a railing? : Total 6 Click Score: 9    End of Session Equipment Utilized During Treatment: Gait belt Activity Tolerance: Patient tolerated treatment well Patient left: in chair;with call bell/phone within reach;with chair alarm set;with nursing/sitter in room Nurse Communication: Mobility status;Precautions;Weight bearing status PT Visit Diagnosis: Other abnormalities of gait and mobility (R26.89);Difficulty in walking, not  elsewhere classified (R26.2);Muscle weakness (generalized) (M62.81);History of falling (Z91.81)    Time: [id number] PT Time Calculation (min) (ACUTE ONLY): 22 min   Charges:   PT Evaluation $PT Eval Moderate Complexity: [place], PT Acute Rehabilitation Services Pager: [phone] Office: [phone]   Sandy Salaam Hanne Kegg 07/14/2019, 8:54 AM

## 2019-07-14 NOTE — Progress Notes (Addendum)
Occupational Therapy Evaluation Patient Details Name: Kaitlyn Ortiz MRN: [id number] DOB: Mar 24, 1932 Today's Date: 07/14/2019    History of Present Illness 86 yo resident of [facility] admitted after fall with left hip fx s/p anterior hemiarthroplasty. PMHx: Parkinson's, CAD, HTN, glaucoma, bronchiecstasis   Clinical Impression   Pt is from [facility] facility, pt's daughter present during session and reports pt was using rollator for ambulation and required "a lot" of assistance for ADL, daughter unable to provide specifics. She is also unsure if pt was able to feed herself and reports today pt required a lot of assistance for eating. Pt currently required modA for grooming with multimodal cues. She required maxA for rolling R<>L in bed and required increased time and effort. Further mobility deferred for pt/therapist safety and due to pt's report of increased pain. Pt would benefit greatly from continued OT services as SNF to maximize safety and independence with ADL. Should pt remain in acute care, will continue to follow.     Follow Up Recommendations  SNF    Equipment Recommendations  3 in 1 bedside commode    Recommendations for Other Services       Precautions / Restrictions Precautions Precautions: Fall Restrictions Weight Bearing Restrictions: Yes LLE Weight Bearing: Weight bearing as tolerated      Mobility Bed Mobility Overal bed mobility: Needs Assistance Bed Mobility: Rolling Rolling: Max assist         General bed mobility comments: maxA with increased time;pt declined further mobiltiy due to pain  Transfers                 General transfer comment: deferred    Balance       Sitting balance - Comments: deferred                                   ADL either performed or assessed with clinical judgement   ADL Overall ADL's : Needs assistance/impaired Eating/Feeding: Maximal assistance Eating/Feeding  Details (indicate cue type and reason): daughter reports pt needed maxA for feeding due to increased spillage Grooming: Moderate assistance;Sitting Grooming Details (indicate cue type and reason): modA for cueing/prompting Upper Body Bathing: Maximal assistance   Lower Body Bathing: Maximal assistance   Upper Body Dressing : Maximal assistance   Lower Body Dressing: Total assistance     Toilet Transfer Details (indicate cue type and reason): deferred for pt/therapist safety   Toileting - Clothing Manipulation Details (indicate cue type and reason): deferred for pt/therapist safety       General ADL Comments: pt limited by pain and cognition     Vision         Perception     Praxis      Pertinent Vitals/Pain Pain Assessment: No/denies pain     Hand Dominance     Extremity/Trunk Assessment Upper Extremity Assessment Upper Extremity Assessment: Generalized weakness   Lower Extremity Assessment Lower Extremity Assessment: Generalized weakness   Cervical / Trunk Assessment Cervical / Trunk Assessment: Kyphotic   Communication Communication Communication: HOH   Cognition Arousal/Alertness: Awake/alert Behavior During Therapy: Flat affect Overall Cognitive Status: History of cognitive impairments - at baseline                                 General Comments: pt with history of dementia living in memory care facility. pt  able to follow single step commands inconsistently with increased time. Pt's daughter reports this is baseline   General Comments  pt's daughter present during session    Exercises Exercises: Other exercises Other Exercises Other Exercises: elbow flexion/extension x10 BUE  Other Exercises: shoulder flexion/extension BUE x10   Shoulder Instructions      Home Living Family/patient expects to be discharged to:: Skilled nursing facility                                 Additional Comments: pt poor historian       Prior Functioning/Environment Level of Independence: Independent with assistive device(s)        Comments: per prior admission walks with walker, meals in dining hall, assist for bathing and medication. Pt's daughter present and able to provide some information but due to COVID daughter is unsure of her level of independence with ADL, but reports pt needed a lot of assistance        OT Problem List: Decreased strength;Decreased range of motion;Decreased activity tolerance;Impaired balance (sitting and/or standing);Decreased safety awareness;Decreased cognition;Decreased knowledge of use of DME or AE;Decreased knowledge of precautions;Pain      OT Treatment/Interventions: Self-care/ADL training;Therapeutic exercise;Therapeutic activities;Cognitive remediation/compensation;Patient/family education;Balance training    OT Goals(Current goals can be found in the care plan section) Acute Rehab OT Goals Patient Stated Goal: pt did not state OT Goal Formulation: Patient unable to participate in goal setting Time For Goal Achievement: 07/28/19 Potential to Achieve Goals: Good ADL Goals Pt Will Perform Eating: with set-up;sitting Pt Will Perform Grooming: with set-up;sitting Pt Will Transfer to Toilet: with min assist;ambulating  OT Frequency: Min 2X/week   Barriers to D/C:            Co-evaluation              AM-PAC OT "6 Clicks" Daily Activity     Outcome Measure Help from another person eating meals?: A Lot Help from another person taking care of personal grooming?: A Lot Help from another person toileting, which includes using toliet, bedpan, or urinal?: Total Help from another person bathing (including washing, rinsing, drying)?: A Lot Help from another person to put on and taking off regular upper body clothing?: A Lot Help from another person to put on and taking off regular lower body clothing?: Total 6 Click Score: 10   End of Session Nurse Communication: Mobility  status  Activity Tolerance: Patient tolerated treatment well Patient left: in bed;with call bell/phone within reach;with bed alarm set;with family/visitor present  OT Visit Diagnosis: Unsteadiness on feet (R26.81);Other abnormalities of gait and mobility (R26.89);Muscle weakness (generalized) (M62.81);Other symptoms and signs involving cognitive function;Pain Pain - Right/Left: Left Pain - part of body: Hip                Time: [id number] OT Time Calculation (min): 17 min Charges:  OT General Charges $OT Visit: 1 Visit OT Evaluation $OT Eval Moderate Complexity: [place] OTR/L Acute Rehabilitation Services Office: [place] 07/14/2019, 3:45 PM

## 2019-07-14 NOTE — Progress Notes (Signed)
RNCM received consult for possible SNF placement at time of discharge. RNCM spoke with patient's daughter [name] regarding PT recommendation of SNF placement at time of discharge. [name] expressed understanding of PT recommendation and is agreeable for SNF placement at time of discharge for mom. Pt is [facility] Daughter expressed  preference for [facility]. RNCM discussed insurance authorization process and provided Medicare SNF ratings list. No further questions reported at this time. RNCM to continue to follow and assist with discharge planning needs. Hilarie Fredrickson (Daughter)     [phone]

## 2019-07-14 NOTE — Discharge Summary (Signed)
Physician Discharge Summary  Kaitlyn Ortiz [id number] DOB: February 13, 1932 DOA: 07/12/2019  PCP: Gayland Curry, DO  Admit date: 07/12/2019 Discharge date: 07/14/2019  Admitted From: Memory care Disposition:  Memory care with skilled nursing  Discharge Condition:Stable CODE STATUS:, DNR Diet recommendation: Heart Healthy  Brief/Interim Summary:  Patient is a 86 year old female with history of Parkinson's disease with memory deficit, coronary disease, hypertension, glaucoma, bronchiectasis who presented with left hip pain, unable to ambulate.  Patient resides in [facility].  There was report of a fall.  On presentation, she was hemodynamically stable.  X-ray done on presentation showed left hip fracture.  Orthopedics consulted, she underwent  left hip hemiarthroplasty on 07/13/19.  PT/OT  recommended skilled nursing facility.    Hemodynamically stable for discharge today.  Following problems were addressed during her hospitalization:  Left hip fracture: Found to have left femoral neck fracture. Secondary to mechanical fall.  Orthopedics consulted and she underwent ORIF.  Started on DVT prophylaxis with aspirin twice a day which will be continued for 6 weeks.  Continue pain management. Follow-up with orthopedics in 2 weeks. She needs to be evaluated by PT and OT at memory care and get therapy.  Dementia/parkinsonism/recurrent falls: Lives in memory care.  Needs  skilled facility on discharge.  Continue supportive care.  On Sinemet, donepezil  Hypertension: Currently blood pressure stable.  Continue amlodipine  History of bronchiectasis: Currently respiratory status stable.  Continue home inhalers  Coronary artery disease:   No chest pain at present.  Continue home medications  Hyperlipidemia: Continue pravastatin  Acute normocytic anemia: Most likely associated with surgery.  Check CBC in a week.     Discharge Diagnoses:  Active Problems:   Essential  hypertension   Bronchiectasis (HCC)   Coronary artery disease with history of myocardial infarction without history of CABG   Hyperlipidemia LDL goal <100   Thoracic aortic aneurysm without rupture ([place])   Dementia without behavioral disturbance ([place])   Parkinsonism ([place])   Recurrent falls   Osteopenia   Hip fracture [facility])    Discharge Instructions  Discharge Instructions    Diet - low sodium heart healthy   Complete by: As directed    Discharge instructions   Complete by: As directed    1)Take prescribed medication as instructed. 2)Follow up with orthopedics in 2 weeks.  Name and number of the provider has been attached. 3)Check CBC in a week.   Increase activity slowly   Complete by: As directed      Allergies as of 07/14/2019      Reactions   Caffeine       Medication List    TAKE these medications   albuterol 0.63 MG/3ML nebulizer solution Commonly known as: ACCUNEB Take 1 ampule by nebulization 2 (two) times daily.   amLODipine 2.5 MG tablet Commonly known as: NORVASC Take 1 tablet by mouth daily.   aspirin 81 MG chewable tablet Chew 1 tablet (81 mg total) by mouth 2 (two) times daily at 10 AM and 5 PM. Continue taking once a day after 6 weeks What changed:   when to take this  additional instructions   bisacodyl 10 MG suppository Commonly known as: DULCOLAX Place 10 mg rectally as needed for moderate constipation.   carbidopa-levodopa 25-100 MG tablet Commonly known as: SINEMET IR Take 2.5 tabs TID with meals   donepezil 5 MG tablet Commonly known as: ARICEPT Take 1 tablet (5 mg total) by mouth at bedtime.   GLUCOSAMINE 1500  COMPLEX PO Take 1 tablet by mouth in the morning and at bedtime.   HYDROcodone-acetaminophen 5-325 MG tablet Commonly known as: NORCO/VICODIN Take 1 tablet by mouth every 4 (four) hours as needed for moderate pain.   latanoprost 0.005 % ophthalmic solution Commonly known as: XALATAN 1 drop at bedtime.   Lutein 20 MG  Tabs Take 20 mg by mouth at bedtime.   multivitamin tablet Take 1 tablet by mouth daily.   polyethylene glycol 17 g packet Commonly known as: MIRALAX / GLYCOLAX Take 17 g by mouth daily.   pravastatin 40 MG tablet Commonly known as: PRAVACHOL TAKE 1 TABLET ONCE DAILY.   PreserVision AREDS 2+Multi Vit Caps Take by mouth 2 (two) times daily.   Senexon-S 8.6-50 MG tablet Generic drug: senna-docusate Take 1 tablet by mouth daily as needed.   vitamin C 1000 MG tablet Take 1,000 mg by mouth daily.   Vitamin D3 125 MCG (5000 UT) Caps Take 1 capsule (5,000 Units total) by mouth daily.      Follow-up Information    Swinteck, Aaron Edelman, MD. Schedule an appointment as soon as possible for a visit in 2 weeks.   Specialty: Orthopedic Surgery Why: For wound re-check Contact information: [address] [place] [address] [phone]          Allergies  Allergen Reactions  . Caffeine     Consultations:  Orthopedics   Procedures/Studies: DG Chest 1 View  Result Date: 07/12/2019 CLINICAL DATA:  Fall, unwitnessed, yesterday EXAM: CHEST  1 VIEW COMPARISON:  11/14/2017 chest radiograph. FINDINGS: Stable cardiomediastinal silhouette with normal heart size. No pneumothorax. No pleural effusion. Lungs appear clear, with no acute consolidative airspace disease and no pulmonary edema. IMPRESSION: No active disease. Electronically Signed   By: Ilona Sorrel M.D.   On: 07/12/2019 17:47   DG Knee 1-2 Views Left  Result Date: 07/13/2019 CLINICAL DATA:  Closed fracture EXAM: LEFT KNEE - 1-2 VIEW COMPARISON:  None. FINDINGS: No fracture of the proximal tibia or distal femur. Patella is normal. No joint effusion. Narrowing of the lateral compartment with associated osteophytosis. No joint effusion. IMPRESSION: 1. No acute findings LEFT knee. 2. Osteoarthritis of the lateral compartment. Electronically Signed   By: Suzy Bouchard M.D.   On: 07/13/2019 09:59   CT Head Wo  Contrast  Result Date: 07/12/2019 CLINICAL DATA:  86 year old female with headache and neck pain following fall yesterday. Initial encounter. EXAM: CT HEAD WITHOUT CONTRAST CT CERVICAL SPINE WITHOUT CONTRAST TECHNIQUE: Multidetector CT imaging of the head and cervical spine was performed following the standard protocol without intravenous contrast. Multiplanar CT image reconstructions of the cervical spine were also generated. COMPARISON:  11/14/2017 CT FINDINGS: CT HEAD FINDINGS Brain: No evidence of acute infarction, hemorrhage, hydrocephalus, extra-axial collection or mass lesion/mass effect. Atrophy and chronic small-vessel white matter ischemic changes again noted. Vascular: Carotid atherosclerotic calcifications are noted. Skull: Normal. Negative for fracture or focal lesion. Sinuses/Orbits: No acute finding. Other: None. CT CERVICAL SPINE FINDINGS Alignment: Normal. Skull base and vertebrae: No acute fracture. No primary bone lesion or focal pathologic process. Soft tissues and spinal canal: No prevertebral fluid or swelling. No visible canal hematoma. Disc levels: Mild multilevel degenerative disc disease/spondylosis, greatest at C5-C6 again noted. Mild to moderate multilevel facet arthropathy again identified. Upper chest: No acute abnormality. Other: None IMPRESSION: 1. No evidence of acute intracranial abnormality. Atrophy and chronic small-vessel white matter ischemic changes. 2. No static evidence of acute injury to the cervical spine. Electronically Signed  By: Margarette Canada M.D.   On: 07/12/2019 17:30   CT Cervical Spine Wo Contrast  Result Date: 07/12/2019 CLINICAL DATA:  86 year old female with headache and neck pain following fall yesterday. Initial encounter. EXAM: CT HEAD WITHOUT CONTRAST CT CERVICAL SPINE WITHOUT CONTRAST TECHNIQUE: Multidetector CT imaging of the head and cervical spine was performed following the standard protocol without intravenous contrast. Multiplanar CT image  reconstructions of the cervical spine were also generated. COMPARISON:  11/14/2017 CT FINDINGS: CT HEAD FINDINGS Brain: No evidence of acute infarction, hemorrhage, hydrocephalus, extra-axial collection or mass lesion/mass effect. Atrophy and chronic small-vessel white matter ischemic changes again noted. Vascular: Carotid atherosclerotic calcifications are noted. Skull: Normal. Negative for fracture or focal lesion. Sinuses/Orbits: No acute finding. Other: None. CT CERVICAL SPINE FINDINGS Alignment: Normal. Skull base and vertebrae: No acute fracture. No primary bone lesion or focal pathologic process. Soft tissues and spinal canal: No prevertebral fluid or swelling. No visible canal hematoma. Disc levels: Mild multilevel degenerative disc disease/spondylosis, greatest at C5-C6 again noted. Mild to moderate multilevel facet arthropathy again identified. Upper chest: No acute abnormality. Other: None IMPRESSION: 1. No evidence of acute intracranial abnormality. Atrophy and chronic small-vessel white matter ischemic changes. 2. No static evidence of acute injury to the cervical spine. Electronically Signed   By: Margarette Canada M.D.   On: 07/12/2019 17:30   Pelvis Portable  Result Date: 07/13/2019 CLINICAL DATA:  Postop EXAM: PORTABLE PELVIS 1-2 VIEWS COMPARISON:  07/12/2019 FINDINGS: Interval left hip replacement. No fracture or malalignment. Gas in the soft tissues consistent with recent surgery. IMPRESSION: Interval left hip replacement with expected postsurgical changes Electronically Signed   By: Donavan Foil M.D.   On: 07/13/2019 15:46   DG C-Arm 1-60 Min  Result Date: 07/13/2019 CLINICAL DATA:  Status post left hemiarthroplasty. EXAM: OPERATIVE left HIP (WITH PELVIS IF PERFORMED) 2 VIEWS TECHNIQUE: Fluoroscopic spot image(s) were submitted for interpretation post-operatively. FLUOROSCOPY TIME:  19 seconds. COMPARISON:  None. FINDINGS: Two intraoperative fluoroscopic images of the left hip demonstrate left  hemiarthroplasty in grossly good position. IMPRESSION: Status post left hip hemiarthroplasty. Electronically Signed   By: Marijo Conception M.D.   On: 07/13/2019 14:14   DG HIP OPERATIVE UNILAT WITH PELVIS LEFT  Result Date: 07/13/2019 CLINICAL DATA:  Status post left hemiarthroplasty. EXAM: OPERATIVE left HIP (WITH PELVIS IF PERFORMED) 2 VIEWS TECHNIQUE: Fluoroscopic spot image(s) were submitted for interpretation post-operatively. FLUOROSCOPY TIME:  19 seconds. COMPARISON:  None. FINDINGS: Two intraoperative fluoroscopic images of the left hip demonstrate left hemiarthroplasty in grossly good position. IMPRESSION: Status post left hip hemiarthroplasty. Electronically Signed   By: Marijo Conception M.D.   On: 07/13/2019 14:14   DG Hip Unilat W or Wo Pelvis 2-3 Views Left  Result Date: 07/12/2019 CLINICAL DATA:  Unwitnessed fall yesterday EXAM: DG HIP (WITH OR WITHOUT PELVIS) 2-3V LEFT COMPARISON:  11/14/2017 left hip radiographs FINDINGS: Subcapital left femoral neck fracture with 2 cm over riding of the fracture fragments and 2 cm lateral displacement of the distal fracture fragment. No dislocation of the left femoral head fracture fragment at the left hip joint. No additional fractures. No pelvic diastasis. No focal osseous lesions. IMPRESSION: Displaced subcapital left femoral neck fracture as detailed. Electronically Signed   By: Ilona Sorrel M.D.   On: 07/12/2019 17:49       Subjective: Patient seen and examined at the bedside this morning.  Hemodynamically stable for discharge today.  Discharge Exam: Vitals:   07/14/19 [id number] 07/14/19 [id number]  BP: 97/74 134/75  Pulse: 92 85  Resp: 16 16  Temp: 98.2 F (36.8 C) 99.3 F (37.4 C)  SpO2: 93% 93%   Vitals:   07/13/19 1940 07/13/19 2346 07/14/19 0411 07/14/19 0753  BP: 103/66 (!) 112/91 97/74 134/75  Pulse: 71 77 92 85  Resp: 16 16 16 16   Temp: 98.1 F (36.7 C) (!) 97.5 F (36.4 C) 98.2 F (36.8 C) 99.3 F (37.4 C)  TempSrc:  Oral Oral  Oral  SpO2: 97% 97% 93% 93%    General: Pt is alert, awake, not in acute distress Cardiovascular: RRR, S1/S2 +, no rubs, no gallops Respiratory: CTA bilaterally, no wheezing, no rhonchi Abdominal: Soft, NT, ND, bowel sounds + Extremities: no edema, no cyanosis    The results of significant diagnostics from this hospitalization (including imaging, microbiology, ancillary and laboratory) are listed below for reference.     Microbiology: Recent Results (from the past 240 hour(s))  SARS CORONAVIRUS 2 (TAT 6-24 HRS) Nasopharyngeal Nasopharyngeal Swab     Status: None   Collection Time: 07/12/19  6:33 PM   Specimen: Nasopharyngeal Swab  Result Value Ref Range Status   SARS Coronavirus 2 NEGATIVE NEGATIVE Final    Comment: (NOTE) SARS-CoV-2 target nucleic acids are NOT DETECTED. The SARS-CoV-2 RNA is generally detectable in upper and lower respiratory specimens during the acute phase of infection. Negative results do not preclude SARS-CoV-2 infection, do not rule out co-infections with other pathogens, and should not be used as the sole basis for treatment or other patient management decisions. Negative results must be combined with clinical observations, patient history, and epidemiological information. The expected result is Negative. Fact Sheet for Patients: SugarRoll.be Fact Sheet for Healthcare Providers: https://www.woods-mathews.com/ This test is not yet approved or cleared by the [place] FDA and  has been authorized for detection and/or diagnosis of SARS-CoV-2 by FDA under an Emergency Use Authorization (EUA). This EUA will remain  in effect (meaning this test can be used) for the duration of the COVID-19 declaration under Section 56 4(b)(1) of the Act, 21 U.S.C. section 360bbb-3(b)(1), unless the authorization is terminated or revoked sooner. Performed at [facility], [place] [address] [place], [place] [address]    Surgical pcr screen     Status: None   Collection Time: 07/13/19 12:22 AM   Specimen: Nasal Mucosa; Nasal Swab  Result Value Ref Range Status   MRSA, PCR NEGATIVE NEGATIVE Final   Staphylococcus aureus NEGATIVE NEGATIVE Final    Comment: (NOTE) The Xpert SA Assay (FDA approved for NASAL specimens in patients 82 years of age and older), is one component of a comprehensive surveillance program. It is not intended to diagnose infection nor to guide or monitor treatment. Performed at [facility], [place] [address], [place], [place] [address]      Labs: BNP (last 3 results) No results for input(s): BNP in the last 8760 hours. Basic Metabolic Panel: Recent Labs  Lab 07/12/19 1653 07/13/19 0302 07/14/19 0436  NA 142 142 141  K 3.6 3.6 3.6  CL 107 107 107  CO2 27 25 24   GLUCOSE 141* 137* 121*  BUN 23 20 23   CREATININE 0.91 0.88 0.83  CALCIUM 9.1 8.3* 7.8*   Liver Function Tests: Recent Labs  Lab 07/12/19 1653  AST 34  ALT 18  ALKPHOS 51  BILITOT 0.9  PROT 7.5  ALBUMIN 4.3   No results for input(s): LIPASE, AMYLASE in the last 168 hours. No results for input(s):  AMMONIA in the last 168 hours. CBC: Recent Labs  Lab 07/12/19 1653 07/13/19 0302 07/14/19 0436  WBC 10.3 9.9 6.6  NEUTROABS 9.0*  --  5.4  HGB 13.6 12.5 9.8*  HCT 42.3 37.9 30.0*  MCV 92.4 90.7 92.0  PLT 180 177 135*   Cardiac Enzymes: No results for input(s): CKTOTAL, CKMB, CKMBINDEX, TROPONINI in the last 168 hours. BNP: Invalid input(s): POCBNP CBG: No results for input(s): GLUCAP in the last 168 hours. D-Dimer No results for input(s): DDIMER in the last 72 hours. Hgb A1c No results for input(s): HGBA1C in the last 72 hours. Lipid Profile No results for input(s): CHOL, HDL, LDLCALC, TRIG, CHOLHDL, LDLDIRECT in the last 72 hours. Thyroid function studies No results for input(s): TSH, T4TOTAL, T3FREE, THYROIDAB in the last 72 hours.  Invalid input(s): FREET3 Anemia work up No results  for input(s): VITAMINB12, FOLATE, FERRITIN, TIBC, IRON, RETICCTPCT in the last 72 hours. Urinalysis    Component Value Date/Time   COLORURINE BROWN (A) 07/12/2019 2005   APPEARANCEUR TURBID (A) 07/12/2019 2005   LABSPEC 1.015 07/12/2019 2005   PHURINE 8.5 (H) 07/12/2019 2005   GLUCOSEU NEGATIVE 07/12/2019 2005   HGBUR LARGE (A) 07/12/2019 2005   BILIRUBINUR MODERATE (A) 07/12/2019 2005   KETONESUR TRACE (A) 07/12/2019 2005   PROTEINUR 100 (A) 07/12/2019 2005   NITRITE POSITIVE (A) 07/12/2019 2005   LEUKOCYTESUR MODERATE (A) 07/12/2019 2005   Sepsis Labs Invalid input(s): PROCALCITONIN,  WBC,  LACTICIDVEN Microbiology Recent Results (from the past 240 hour(s))  SARS CORONAVIRUS 2 (TAT 6-24 HRS) Nasopharyngeal Nasopharyngeal Swab     Status: None   Collection Time: 07/12/19  6:33 PM   Specimen: Nasopharyngeal Swab  Result Value Ref Range Status   SARS Coronavirus 2 NEGATIVE NEGATIVE Final    Comment: (NOTE) SARS-CoV-2 target nucleic acids are NOT DETECTED. The SARS-CoV-2 RNA is generally detectable in upper and lower respiratory specimens during the acute phase of infection. Negative results do not preclude SARS-CoV-2 infection, do not rule out co-infections with other pathogens, and should not be used as the sole basis for treatment or other patient management decisions. Negative results must be combined with clinical observations, patient history, and epidemiological information. The expected result is Negative. Fact Sheet for Patients: SugarRoll.be Fact Sheet for Healthcare Providers: https://www.woods-mathews.com/ This test is not yet approved or cleared by the [place] FDA and  has been authorized for detection and/or diagnosis of SARS-CoV-2 by FDA under an Emergency Use Authorization (EUA). This EUA will remain  in effect (meaning this test can be used) for the duration of the COVID-19 declaration under Section 56 4(b)(1) of  the Act, 21 U.S.C. section 360bbb-3(b)(1), unless the authorization is terminated or revoked sooner. Performed at [facility], [place] [address] [place], [place] [address]   Surgical pcr screen     Status: None   Collection Time: 07/13/19 12:22 AM   Specimen: Nasal Mucosa; Nasal Swab  Result Value Ref Range Status   MRSA, PCR NEGATIVE NEGATIVE Final   Staphylococcus aureus NEGATIVE NEGATIVE Final    Comment: (NOTE) The Xpert SA Assay (FDA approved for NASAL specimens in patients 76 years of age and older), is one component of a comprehensive surveillance program. It is not intended to diagnose infection nor to guide or monitor treatment. Performed at [facility], [place] [address] [place], [place] [address]     Please note: You were cared for by a hospitalist during your hospital stay. Once you are  discharged, your primary care physician will handle any further medical issues. Please note that NO REFILLS for any discharge medications will be authorized once you are discharged, as it is imperative that you return to your primary care physician (or establish a relationship with a primary care physician if you do not have one) for your post hospital discharge needs so that they can reassess your need for medications and monitor your lab values.    Time coordinating discharge: 40 minutes  SIGNED:   Shelly Coss, MD  [facility] 07/14/2019, 2:54 PM Pager [phone]  If 7PM-7AM, please contact night-coverage [email or web address] Password [email or web address]

## 2019-07-14 NOTE — Progress Notes (Signed)
Pt bladder scan @ 0426 is 17ml. Pt receiving IV fluids 67ml/hr. Pt has not voided yet within shift. Will continue to monitor pt and inform oncoming shift

## 2019-07-14 NOTE — TOC Transition Note (Addendum)
Transition of Care [facility]) - CM/SW Discharge Note   Patient Details  Name: Kaitlyn Ortiz MRN: [id number] Date of Birth: 09/03/1931  Transition of Care [facility] CM/SW Contact:  Sharin Mons, RN Phone Number: [phone] 07/14/2019, 3:20 PM   Clinical Narrative:    Patient will DC to: [facility]/ SNF Anticipated DC date: 07/14/2019 Family notified: [name] (daughter) Transport by: Corey Harold Admitted after fall with left hip fx s/p anterior hemiarthroplasty, 07/13/2019.  PMHx: Parkinson's, CAD, HTN, glaucoma, bronchiecstasis  Per MD patient ready for DC to SNF/[facility] . RN, patient, patient's family, and facility notified of DC. Discharge Summary and FL2 sent to facility. RN to call report prior to discharge [phone]). DC packet on chart. Ambulance transport requested for patient.   RNCM will sign off for now as intervention is no longer needed. Please consult [place] again if new needs arise.  07/14/2019@ 1545 Pt's d/c cancelled  Per MD 2/2 to not voiding... TOC team will continue to monitor and follow  Final next level of care: Skilled Nursing Facility(SNF/Memory Care @ [facility]) Barriers to Discharge: Continued Medical Work up   Patient Goals and CMS Choice        Discharge Placement                       Discharge Plan and Services                                     Social Determinants of Health (SDOH) Interventions     Readmission Risk Interventions No flowsheet data found.

## 2019-07-14 NOTE — NC FL2 (Addendum)
[place] MEDICAID FL2 LEVEL OF CARE SCREENING TOOL     IDENTIFICATION  Patient Name: Kaitlyn Ortiz Birthdate: 1931-11-03 Sex: female Admission Date (Current Location): 07/12/2019  [facility] and [place] Number:  Herbalist and Address:  [facility], [place] [address], [place], [place] [address]      Provider Number: [id number]  Attending Physician Name and Address:  Shelly Coss, MD  Relative Name and Phone Number:  Hilarie Fredrickson ([phone]    Current Level of Care: Hospital Recommended Level of Care: [place] Prior Approval Number:    Date Approved/Denied:   PASRR Number:    Discharge Plan: SNF    Current Diagnoses: Patient Active Problem List   Diagnosis Date Noted  . Hip fracture ([place]) 07/12/2019  . Fall   . Left displaced femoral neck fracture ([place])   . Multiple system atrophy with predominant parkinsonism ([place]) 06/12/2019  . Constipation due to neurogenic bowel 06/12/2019  . Senile osteoporosis 06/12/2019  . Venous insufficiency 06/12/2019  . Depression, major, single episode, moderate ([place]) 11/05/2018  . Paranoid delusion ([place]) 11/05/2018  . Neurogenic orthostatic hypotension ([place]) 11/05/2018  . Osteopenia 02/26/2018  . Normocytic normochromic anemia 11/15/2017  . Overactive bladder 11/04/2017  . Parkinsonism ([place]) 11/04/2017  . Recurrent falls 11/04/2017  . Left lateral knee pain 08/07/2017  . Dilated aortic root ([place]) 05/02/2017  . Benign hypertension   . Heart attack ([place])   . Dementia without behavioral disturbance ([place]) 04/10/2017  . Pain of toe 04/10/2017  . Essential hypertension 12/26/2016  . Bronchiectasis ([place]) 12/26/2016  . Glaucoma 12/26/2016  . Urinary incontinence 12/26/2016  . Coronary artery disease with history of myocardial infarction without history of CABG 09/18/2016  . Thoracic aortic aneurysm without rupture ([place]) 08/28/2016  . Hyperlipidemia LDL goal <100  08/16/2016    Orientation RESPIRATION BLADDER Height & Weight     Self  Normal Incontinent Weight:   Height:     BEHAVIORAL SYMPTOMS/MOOD NEUROLOGICAL BOWEL NUTRITION STATUS      Incontinent Diet  AMBULATORY STATUS COMMUNICATION OF NEEDS Skin   Extensive Assist Verbally Normal, Other (Comment)(s/p LEFT HEMI-HIP ARTHROPLASTY)                       Personal Care Assistance Level of Assistance  Bathing, Feeding, Dressing Bathing Assistance: Maximum assistance Feeding assistance: Limited assistance Dressing Assistance: Maximum assistance     Functional Limitations Info  Sight, Hearing, Speech Sight Info: Adequate Hearing Info: Adequate Speech Info: Adequate    SPECIAL CARE FACTORS FREQUENCY  PT (By licensed PT), OT (By licensed OT)     PT Frequency: 5x / week, evaluate and treat OT Frequency: 5x / week, evaluate and treat            Contractures Contractures Info: Not present    Additional Factors Info  Code Status, Allergies Code Status Info: DNR Allergies Info: Caffeine           Current Medications (07/14/2019):  This is the current hospital active medication list Current Facility-Administered Medications  Medication Dose Route Frequency Provider Last Rate Last Admin  . acetaminophen (TYLENOL) tablet 650 mg  650 mg Oral Q6H PRN Rod Can, MD   325 mg at 07/14/19 [id number]   Or  . acetaminophen (TYLENOL) suppository 650 mg  650 mg Rectal Q6H PRN Swinteck, Aaron Edelman, MD      . amLODipine (NORVASC) tablet 2.5 mg  2.5 mg Oral Daily Rod Can, MD  2.5 mg at 07/14/19 0813  . ascorbic acid (VITAMIN C) tablet 1,000 mg  1,000 mg Oral Daily Rod Can, MD   500 mg at 07/14/19 0814  . carbidopa-levodopa (SINEMET IR) 25-100 MG per tablet immediate release 2.5 tablet  2.5 tablet Oral TID WC Rod Can, MD   2.5 tablet at 07/14/19 1134  . cholecalciferol (VITAMIN D3) tablet 5,000 Units  5,000 Units Oral Daily Rod Can, MD   4,000 Units at 07/14/19  [phone]  . docusate sodium (COLACE) capsule 100 mg  100 mg Oral BID Rod Can, MD   100 mg at 07/14/19 0813  . enoxaparin (LOVENOX) injection 40 mg  40 mg Subcutaneous Q24H Rod Can, MD   40 mg at 07/14/19 0813  . menthol-cetylpyridinium (CEPACOL) lozenge 3 mg  1 lozenge Oral PRN Swinteck, Aaron Edelman, MD       Or  . phenol (CHLORASEPTIC) mouth spray 1 spray  1 spray Mouth/Throat PRN Swinteck, Aaron Edelman, MD      . metoCLOPramide (REGLAN) tablet 5-10 mg  5-10 mg Oral Q8H PRN Swinteck, Aaron Edelman, MD       Or  . metoCLOPramide (REGLAN) injection 5-10 mg  5-10 mg Intravenous Q8H PRN Swinteck, Aaron Edelman, MD      . morphine 2 MG/ML injection 1 mg  1 mg Intravenous Q2H PRN Swinteck, Aaron Edelman, MD      . ondansetron (ZOFRAN) tablet 4 mg  4 mg Oral Q6H PRN Swinteck, Aaron Edelman, MD       Or  . ondansetron (ZOFRAN) injection 4 mg  4 mg Intravenous Q6H PRN Swinteck, Aaron Edelman, MD      . polyethylene glycol (MIRALAX / GLYCOLAX) packet 17 g  17 g Oral Daily Rod Can, MD   17 g at 07/14/19 [id number]     Discharge Medications:   Allergies as of 07/14/2019      Reactions   Caffeine          Medication List    TAKE these medications   albuterol 0.63 MG/3ML nebulizer solution Commonly known as: ACCUNEB Take 1 ampule by nebulization 2 (two) times daily.   amLODipine 2.5 MG tablet Commonly known as: NORVASC Take 1 tablet by mouth daily.   aspirin 81 MG chewable tablet Chew 1 tablet (81 mg total) by mouth 2 (two) times daily at 10 AM and 5 PM. Continue taking once a day after 6 weeks What changed:   when to take this  additional instructions   bisacodyl 10 MG suppository Commonly known as: DULCOLAX Place 10 mg rectally as needed for moderate constipation.   carbidopa-levodopa 25-100 MG tablet Commonly known as: SINEMET IR Take 2.5 tabs TID with meals   donepezil 5 MG tablet Commonly known as: ARICEPT Take 1 tablet (5 mg total) by mouth at bedtime.   GLUCOSAMINE 1500 COMPLEX PO Take 1  tablet by mouth in the morning and at bedtime.   HYDROcodone-acetaminophen 5-325 MG tablet Commonly known as: NORCO/VICODIN Take 1 tablet by mouth every 4 (four) hours as needed for moderate pain.   latanoprost 0.005 % ophthalmic solution Commonly known as: XALATAN 1 drop at bedtime.   Lutein 20 MG Tabs Take 20 mg by mouth at bedtime.   multivitamin tablet Take 1 tablet by mouth daily.   polyethylene glycol 17 g packet Commonly known as: MIRALAX / GLYCOLAX Take 17 g by mouth daily.   pravastatin 40 MG tablet Commonly known as: PRAVACHOL TAKE 1 TABLET ONCE DAILY.   PreserVision AREDS 2+Multi Vit Caps Take by mouth 2 (  two) times daily.   Senexon-S 8.6-50 MG tablet Generic drug: senna-docusate Take 1 tablet by mouth daily as needed.   vitamin C 1000 MG tablet Take 1,000 mg by mouth daily.   Vitamin D3 125 MCG (5000 UT) Caps Take 1 capsule (5,000 Units total) by mouth daily.        Relevant Imaging Results:  Relevant Lab Results:   Additional Information SSN: [id number]  Sharin Mons, RN

## 2019-07-14 NOTE — Plan of Care (Signed)
  Problem: Activity: Goal: Risk for activity intolerance will decrease Outcome: Progressing   Problem: Elimination: Goal: Will not experience complications related to bowel motility Outcome: Progressing Goal: Will not experience complications related to urinary retention Outcome: Not Progressing

## 2019-07-14 NOTE — Progress Notes (Signed)
PROGRESS NOTE    Kaitlyn Ortiz  [id number] DOB: 1932-02-11 DOA: 07/12/2019 PCP: Gayland Curry, DO   Brief Narrative: Patient is a 86 year old female with history of Parkinson's disease with memory deficit, coronary disease, hypertension, glaucoma, bronchiectasis who presented with left hip pain, unable to ambulate.  Patient resides in memory care unit at [facility].  There was report of a fall.  On presentation, she was hemodynamically stable.  X-ray done on presentation showed left hip fracture.  Orthopedics consulted, she underwent  left hip hemiarthroplasty on 07/13/19.  PT recommended skilled nursing facility.  OT evaluation pending.  Case manager made aware.  Assessment & Plan:   Active Problems:   Essential hypertension   Bronchiectasis (HCC)   Coronary artery disease with history of myocardial infarction without history of CABG   Hyperlipidemia LDL goal <100   Thoracic aortic aneurysm without rupture (HCC)   Dementia without behavioral disturbance ([place])   Parkinsonism ([place])   Recurrent falls   Osteopenia   Hip fracture (HCC)   Left hip fracture: Found to have left femoral neck fracture.  Secondary to mechanical fall.  Orthopedics consulted and planning for ORIF.  Continue pain management.   Started on Lovenox for DVT prophylaxis.  PT recommended skilled facility.  Dementia/parkinsonism/recurrent falls: Lives in ALF, memory care.  Needs  skilled facility on discharge.  Continue supportive care.  On Sinemet, donepezil  Hypertension: Currently blood pressure stable.  Continue amlodipine  History of bronchiectasis: Currently respiratory status stable.  Continue home inhalers  Coronary artery disease: On aspirin at home which is on hold.  No chest pain at present.  Continue home medications  Hyperlipidemia: Continue pravastatin  Acute normocytic anemia: Most likely associated with surgery.  Continue to monitor CBC.         DVT prophylaxis:Heparin [place] Code Status:  DNR Family Communication: Discussed with daughter on phone on 07/14/19 Disposition Plan: Patient is from memory care at [facility].  PT recommended skilled nursing facility.  Stable from medical perspective for discharge to skilled nursing facility as soon as bed is available   Consultants: Orthopedics  Procedures: None  Antimicrobials:  Anti-infectives (From admission, onward)   Start     Dose/Rate Route Frequency Ordered Stop   07/13/19 1400  ceFAZolin (ANCEF) IVPB 2g/100 mL premix     2 g 200 mL/hr over 30 Minutes Intravenous Every 6 hours 07/13/19 1359 07/13/19 2206   07/13/19 1030  ceFAZolin (ANCEF) IVPB 2g/100 mL premix  Status:  Discontinued     2 g 200 mL/hr over 30 Minutes Intravenous On call to O.R. 07/13/19 [id number] 07/13/19 1359      Subjective: Patient seen and examined the bedside this morning.  Medically stable.  Sitting in the chair and eating her breakfast.  Confused at baseline.  Not in any kind of distress.  Objective: Vitals:   07/13/19 1940 07/13/19 2346 07/14/19 0411 07/14/19 0753  BP: 103/66 (!) 112/91 97/74 134/75  Pulse: 71 77 92 85  Resp: 16 16 16 16   Temp: 98.1 F (36.7 C) (!) 97.5 F (36.4 C) 98.2 F (36.8 C) 99.3 F (37.4 C)  TempSrc:  Oral Oral Oral  SpO2: 97% 97% 93% 93%    Intake/Output Summary (Last 24 hours) at 07/14/2019 0810 Last data filed at 07/14/2019 0426 Gross per 24 hour  Intake 1539.47 ml  Output 4200 ml  Net -2660.53 ml   There were no vitals filed for this visit.  Examination:  General exam: Pleasantly confused elderly female  Respiratory  system: Bilateral equal air entry, normal vesicular breath sounds, no wheezes or crackles  Cardiovascular system: S1 & S2 heard, RRR. No JVD, murmurs, rubs, gallops or clicks. Gastrointestinal system: Abdomen is nondistended, soft and nontender. No organomegaly or masses felt. Normal bowel sounds heard. Central nervous system: Alert and awake but not oriented  extremities: No edema, no  clubbing ,no cyanosis, clean surgical wound on the left hip  skin: No rashes, lesions or ulcers,no icterus ,no pallor    Data Reviewed: I have personally reviewed following labs and imaging studies  CBC: Recent Labs  Lab 07/12/19 1653 07/13/19 0302 07/14/19 0436  WBC 10.3 9.9 6.6  NEUTROABS 9.0*  --  5.4  HGB 13.6 12.5 9.8*  HCT 42.3 37.9 30.0*  MCV 92.4 90.7 92.0  PLT 180 177 [id number]   Basic Metabolic Panel: Recent Labs  Lab 07/12/19 1653 07/13/19 0302 07/14/19 0436  NA 142 142 141  K 3.6 3.6 3.6  CL 107 107 107  CO2 27 25 24   GLUCOSE 141* 137* 121*  BUN 23 20 23   CREATININE 0.91 0.88 0.83  CALCIUM 9.1 8.3* 7.8*   GFR: CrCl cannot be calculated (Unknown ideal weight.). Liver Function Tests: Recent Labs  Lab 07/12/19 1653  AST 34  ALT 18  ALKPHOS 51  BILITOT 0.9  PROT 7.5  ALBUMIN 4.3   No results for input(s): LIPASE, AMYLASE in the last 168 hours. No results for input(s): AMMONIA in the last 168 hours. Coagulation Profile: Recent Labs  Lab 07/12/19 1653  INR 1.0   Cardiac Enzymes: No results for input(s): CKTOTAL, CKMB, CKMBINDEX, TROPONINI in the last 168 hours. BNP (last 3 results) No results for input(s): PROBNP in the last 8760 hours. HbA1C: No results for input(s): HGBA1C in the last 72 hours. CBG: No results for input(s): GLUCAP in the last 168 hours. Lipid Profile: No results for input(s): CHOL, HDL, LDLCALC, TRIG, CHOLHDL, LDLDIRECT in the last 72 hours. Thyroid Function Tests: No results for input(s): TSH, T4TOTAL, FREET4, T3FREE, THYROIDAB in the last 72 hours. Anemia Panel: No results for input(s): VITAMINB12, FOLATE, FERRITIN, TIBC, IRON, RETICCTPCT in the last 72 hours. Sepsis Labs: No results for input(s): PROCALCITON, LATICACIDVEN in the last 168 hours.  Recent Results (from the past 240 hour(s))  SARS CORONAVIRUS 2 (TAT 6-24 HRS) Nasopharyngeal Nasopharyngeal Swab     Status: None   Collection Time: 07/12/19  6:33 PM    Specimen: Nasopharyngeal Swab  Result Value Ref Range Status   SARS Coronavirus 2 NEGATIVE NEGATIVE Final    Comment: (NOTE) SARS-CoV-2 target nucleic acids are NOT DETECTED. The SARS-CoV-2 RNA is generally detectable in upper and lower respiratory specimens during the acute phase of infection. Negative results do not preclude SARS-CoV-2 infection, do not rule out co-infections with other pathogens, and should not be used as the sole basis for treatment or other patient management decisions. Negative results must be combined with clinical observations, patient history, and epidemiological information. The expected result is Negative. Fact Sheet for Patients: SugarRoll.be Fact Sheet for Healthcare Providers: https://www.woods-mathews.com/ This test is not yet approved or cleared by the [place] FDA and  has been authorized for detection and/or diagnosis of SARS-CoV-2 by FDA under an Emergency Use Authorization (EUA). This EUA will remain  in effect (meaning this test can be used) for the duration of the COVID-19 declaration under Section 56 4(b)(1) of the Act, 21 U.S.C. section 360bbb-3(b)(1), unless the authorization is terminated or revoked sooner. Performed at [facility],  [address], [place], [place] [address]   Surgical pcr screen     Status: None   Collection Time: 07/13/19 12:22 AM   Specimen: Nasal Mucosa; Nasal Swab  Result Value Ref Range Status   MRSA, PCR NEGATIVE NEGATIVE Final   Staphylococcus aureus NEGATIVE NEGATIVE Final    Comment: (NOTE) The Xpert SA Assay (FDA approved for NASAL specimens in patients 15 years of age and older), is one component of a comprehensive surveillance program. It is not intended to diagnose infection nor to guide or monitor treatment. Performed at [facility], [place] [address], [place], [place] [address]          Radiology Studies: DG Chest 1 View  Result Date:  07/12/2019 CLINICAL DATA:  Fall, unwitnessed, yesterday EXAM: CHEST  1 VIEW COMPARISON:  11/14/2017 chest radiograph. FINDINGS: Stable cardiomediastinal silhouette with normal heart size. No pneumothorax. No pleural effusion. Lungs appear clear, with no acute consolidative airspace disease and no pulmonary edema. IMPRESSION: No active disease. Electronically Signed   By: Ilona Sorrel M.D.   On: 07/12/2019 17:47   DG Knee 1-2 Views Left  Result Date: 07/13/2019 CLINICAL DATA:  Closed fracture EXAM: LEFT KNEE - 1-2 VIEW COMPARISON:  None. FINDINGS: No fracture of the proximal tibia or distal femur. Patella is normal. No joint effusion. Narrowing of the lateral compartment with associated osteophytosis. No joint effusion. IMPRESSION: 1. No acute findings LEFT knee. 2. Osteoarthritis of the lateral compartment. Electronically Signed   By: Suzy Bouchard M.D.   On: 07/13/2019 09:59   CT Head Wo Contrast  Result Date: 07/12/2019 CLINICAL DATA:  86 year old female with headache and neck pain following fall yesterday. Initial encounter. EXAM: CT HEAD WITHOUT CONTRAST CT CERVICAL SPINE WITHOUT CONTRAST TECHNIQUE: Multidetector CT imaging of the head and cervical spine was performed following the standard protocol without intravenous contrast. Multiplanar CT image reconstructions of the cervical spine were also generated. COMPARISON:  11/14/2017 CT FINDINGS: CT HEAD FINDINGS Brain: No evidence of acute infarction, hemorrhage, hydrocephalus, extra-axial collection or mass lesion/mass effect. Atrophy and chronic small-vessel white matter ischemic changes again noted. Vascular: Carotid atherosclerotic calcifications are noted. Skull: Normal. Negative for fracture or focal lesion. Sinuses/Orbits: No acute finding. Other: None. CT CERVICAL SPINE FINDINGS Alignment: Normal. Skull base and vertebrae: No acute fracture. No primary bone lesion or focal pathologic process. Soft tissues and spinal canal: No prevertebral fluid  or swelling. No visible canal hematoma. Disc levels: Mild multilevel degenerative disc disease/spondylosis, greatest at C5-C6 again noted. Mild to moderate multilevel facet arthropathy again identified. Upper chest: No acute abnormality. Other: None IMPRESSION: 1. No evidence of acute intracranial abnormality. Atrophy and chronic small-vessel white matter ischemic changes. 2. No static evidence of acute injury to the cervical spine. Electronically Signed   By: Margarette Canada M.D.   On: 07/12/2019 17:30   CT Cervical Spine Wo Contrast  Result Date: 07/12/2019 CLINICAL DATA:  86 year old female with headache and neck pain following fall yesterday. Initial encounter. EXAM: CT HEAD WITHOUT CONTRAST CT CERVICAL SPINE WITHOUT CONTRAST TECHNIQUE: Multidetector CT imaging of the head and cervical spine was performed following the standard protocol without intravenous contrast. Multiplanar CT image reconstructions of the cervical spine were also generated. COMPARISON:  11/14/2017 CT FINDINGS: CT HEAD FINDINGS Brain: No evidence of acute infarction, hemorrhage, hydrocephalus, extra-axial collection or mass lesion/mass effect. Atrophy and chronic small-vessel white matter ischemic changes again noted. Vascular: Carotid atherosclerotic calcifications are noted. Skull: Normal. Negative for fracture or focal lesion.  Sinuses/Orbits: No acute finding. Other: None. CT CERVICAL SPINE FINDINGS Alignment: Normal. Skull base and vertebrae: No acute fracture. No primary bone lesion or focal pathologic process. Soft tissues and spinal canal: No prevertebral fluid or swelling. No visible canal hematoma. Disc levels: Mild multilevel degenerative disc disease/spondylosis, greatest at C5-C6 again noted. Mild to moderate multilevel facet arthropathy again identified. Upper chest: No acute abnormality. Other: None IMPRESSION: 1. No evidence of acute intracranial abnormality. Atrophy and chronic small-vessel white matter ischemic changes. 2. No  static evidence of acute injury to the cervical spine. Electronically Signed   By: Margarette Canada M.D.   On: 07/12/2019 17:30   Pelvis Portable  Result Date: 07/13/2019 CLINICAL DATA:  Postop EXAM: PORTABLE PELVIS 1-2 VIEWS COMPARISON:  07/12/2019 FINDINGS: Interval left hip replacement. No fracture or malalignment. Gas in the soft tissues consistent with recent surgery. IMPRESSION: Interval left hip replacement with expected postsurgical changes Electronically Signed   By: Donavan Foil M.D.   On: 07/13/2019 15:46   DG C-Arm 1-60 Min  Result Date: 07/13/2019 CLINICAL DATA:  Status post left hemiarthroplasty. EXAM: OPERATIVE left HIP (WITH PELVIS IF PERFORMED) 2 VIEWS TECHNIQUE: Fluoroscopic spot image(s) were submitted for interpretation post-operatively. FLUOROSCOPY TIME:  19 seconds. COMPARISON:  None. FINDINGS: Two intraoperative fluoroscopic images of the left hip demonstrate left hemiarthroplasty in grossly good position. IMPRESSION: Status post left hip hemiarthroplasty. Electronically Signed   By: Marijo Conception M.D.   On: 07/13/2019 14:14   DG HIP OPERATIVE UNILAT WITH PELVIS LEFT  Result Date: 07/13/2019 CLINICAL DATA:  Status post left hemiarthroplasty. EXAM: OPERATIVE left HIP (WITH PELVIS IF PERFORMED) 2 VIEWS TECHNIQUE: Fluoroscopic spot image(s) were submitted for interpretation post-operatively. FLUOROSCOPY TIME:  19 seconds. COMPARISON:  None. FINDINGS: Two intraoperative fluoroscopic images of the left hip demonstrate left hemiarthroplasty in grossly good position. IMPRESSION: Status post left hip hemiarthroplasty. Electronically Signed   By: Marijo Conception M.D.   On: 07/13/2019 14:14   DG Hip Unilat W or Wo Pelvis 2-3 Views Left  Result Date: 07/12/2019 CLINICAL DATA:  Unwitnessed fall yesterday EXAM: DG HIP (WITH OR WITHOUT PELVIS) 2-3V LEFT COMPARISON:  11/14/2017 left hip radiographs FINDINGS: Subcapital left femoral neck fracture with 2 cm over riding of the fracture fragments  and 2 cm lateral displacement of the distal fracture fragment. No dislocation of the left femoral head fracture fragment at the left hip joint. No additional fractures. No pelvic diastasis. No focal osseous lesions. IMPRESSION: Displaced subcapital left femoral neck fracture as detailed. Electronically Signed   By: Ilona Sorrel M.D.   On: 07/12/2019 17:49        Scheduled Meds: . amLODipine  2.5 mg Oral Daily  . vitamin C  1,000 mg Oral Daily  . carbidopa-levodopa  2.5 tablet Oral TID WC  . cholecalciferol  5,000 Units Oral Daily  . docusate sodium  100 mg Oral BID  . enoxaparin (LOVENOX) injection  40 mg Subcutaneous Q24H  . polyethylene glycol  17 g Oral Daily   Continuous Infusions:    LOS: 2 days    Time spent: 35 mins.More than 50% of that time was spent in counseling and/or coordination of care.      Shelly Coss, MD [facility] P3/16/2021, 8:10 AM

## 2019-07-15 ENCOUNTER — Inpatient Hospital Stay (HOSPITAL_COMMUNITY): Payer: Medicare Other

## 2019-07-15 DIAGNOSIS — I252 Old myocardial infarction: Secondary | ICD-10-CM

## 2019-07-15 DIAGNOSIS — J479 Bronchiectasis, uncomplicated: Secondary | ICD-10-CM

## 2019-07-15 DIAGNOSIS — I2510 Atherosclerotic heart disease of native coronary artery without angina pectoris: Secondary | ICD-10-CM

## 2019-07-15 DIAGNOSIS — R296 Repeated falls: Secondary | ICD-10-CM

## 2019-07-15 DIAGNOSIS — I10 Essential (primary) hypertension: Secondary | ICD-10-CM

## 2019-07-15 DIAGNOSIS — F0150 Vascular dementia without behavioral disturbance: Secondary | ICD-10-CM

## 2019-07-15 DIAGNOSIS — E785 Hyperlipidemia, unspecified: Secondary | ICD-10-CM

## 2019-07-15 DIAGNOSIS — G20 Parkinson's disease: Secondary | ICD-10-CM

## 2019-07-15 DIAGNOSIS — S72002A Fracture of unspecified part of neck of left femur, initial encounter for closed fracture: Secondary | ICD-10-CM

## 2019-07-15 DIAGNOSIS — R338 Other retention of urine: Secondary | ICD-10-CM

## 2019-07-15 LAB — CBC
HCT: 30.7 % — ABNORMAL LOW (ref 36.0–46.0)
Hemoglobin: 9.9 g/dL — ABNORMAL LOW (ref 12.0–15.0)
MCH: 30 pg (ref 26.0–34.0)
MCHC: 32.2 g/dL (ref 30.0–36.0)
MCV: 93 fL (ref 80.0–100.0)
Platelets: 150 10*3/uL (ref 150–400)
RBC: 3.3 MIL/uL — ABNORMAL LOW (ref 3.87–5.11)
RDW: 15.6 % — ABNORMAL HIGH (ref 11.5–15.5)
WBC: 6 10*3/uL (ref 4.0–10.5)
nRBC: 0 % (ref 0.0–0.2)

## 2019-07-15 MED ORDER — ADULT MULTIVITAMIN W/MINERALS CH
1.0000 | ORAL_TABLET | Freq: Every day | ORAL | Status: DC
  Administered 2019-07-15 – 2019-07-16 (×2): 1 via ORAL
  Filled 2019-07-15 (×2): qty 1

## 2019-07-15 MED ORDER — TAMSULOSIN HCL 0.4 MG PO CAPS: 0.4000 mg | ORAL_CAPSULE | Freq: Every day | ORAL | Status: DC

## 2019-07-15 MED ORDER — CHLORHEXIDINE GLUCONATE CLOTH 2 % EX PADS
6.0000 | MEDICATED_PAD | Freq: Every day | CUTANEOUS | Status: DC
  Administered 2019-07-16: 11:00:00 6 via TOPICAL

## 2019-07-15 MED ORDER — TAMSULOSIN HCL 0.4 MG PO CAPS
0.4000 mg | ORAL_CAPSULE | Freq: Every day | ORAL | Status: DC
  Administered 2019-07-15: 0.4 mg via ORAL
  Filled 2019-07-15: qty 1

## 2019-07-15 MED ORDER — ASPIRIN EC 81 MG PO TBEC
81.0000 mg | DELAYED_RELEASE_TABLET | Freq: Two times a day (BID) | ORAL | Status: DC
  Administered 2019-07-15 – 2019-07-16 (×3): 81 mg via ORAL
  Filled 2019-07-15 (×3): qty 1

## 2019-07-15 NOTE — Progress Notes (Signed)
Pt retaining urine. In&out cath @0450 , 950 mL obtained.

## 2019-07-15 NOTE — Plan of Care (Signed)

## 2019-07-15 NOTE — Progress Notes (Signed)
Pt has not urinated all day, bladder scanned at 1225 there was only 113 ml in bladder, bladder scanned again at 1749 368ml in bladder, Dr. Marylyn Ishihara paged, explaining this would be the 3rd or 4th time I&O cathing pt. He gave orders to place a foley for urinary retention.

## 2019-07-15 NOTE — Progress Notes (Signed)
Marland Kitchen  PROGRESS NOTE    Kaitlyn Ortiz  [id number] DOB: 1932-01-15 DOA: 07/12/2019 PCP: Gayland Curry, DO   Brief Narrative:   Patient is a 86 year old female with history of Parkinson's disease with memory deficit, coronary disease, hypertension, glaucoma, bronchiectasis who presented with left hip pain, unable to ambulate.  Patient resides in memory care unit at [facility].  There was report of a fall.  On presentation, she was hemodynamically stable.  X-ray done on presentation showed left hip fracture.  Orthopedics consulted, she underwent  left hip hemiarthroplasty on 07/13/19.  PT recommended skilled nursing facility.  OT evaluation pending.  Case manager made aware.  07/15/19: Still retaining urine. Add flomax. Will likely need to place foley. Check renal US.    Assessment & Plan:   Active Problems:   Essential hypertension   Bronchiectasis (HCC)   Coronary artery disease with history of myocardial infarction without history of CABG   Hyperlipidemia LDL goal <100   Thoracic aortic aneurysm without rupture (HCC)   Dementia without behavioral disturbance (HCC)   Parkinsonism ([place])   Recurrent falls   Osteopenia   Hip fracture (HCC)  Left hip fracture     - Found to have left femoral neck fracture secondary to mechanical fall.    - Orthopedics consulted and she underwent ORIF.       - Started on DVT prophylaxis with aspirin twice a day which will be continued for 6 weeks.  Continue pain management.     - Follow-up with orthopedics in 2 weeks.     - She needs to be evaluated by PT and OT at memory care and get therapy.  Dementia/parkinsonism/recurrent falls     - Lives in memory care.     - Needs skilled facility ondischarge.      - Continue supportive care.      - On Sinemet, donepezil  Hypertension     - Continue amlodipine  History of bronchiectasis     - Currently respiratory status stable.      - Continue home inhalers  Coronary artery disease     -  No chest pain at present.     - continue ASA, resume statin  Hyperlipidemia     - resume pravastatin  Acute normocytic anemia     - Most likely associatedwith surgery.     - follow CBC  Acute urinary retention     - I&O'd several times over last 24 hrs     - add flomax, check renal US     - will likely need to place foley and have outpt follow up with urology  DVT prophylaxis: lovenox Code Status: DNR Family Communication: Spoke with dtr Hilarie Fredrickson [phone]) by phone and updated with plan.    Disposition Plan: Still retaining urine. Adding flomax. Checking renal US. Will likely need to place foley. Depending on results and improvement, possible d/c tomorrow.   Consultants:   Orthopedics  Procedures:   ORIF  Antimicrobials:  . None   Subjective: Still retaining urine  Objective: Vitals:   07/14/19 1922 07/15/19 0500 07/15/19 0808 07/15/19 1347  BP: (!) 127/105 130/72 (!) 151/73 127/72  Pulse: 81 79 75 73  Resp: 16 16 18 18   Temp: 99.9 F (37.7 C) 98.4 F (36.9 C) 98.8 F (37.1 C) 98.9 F (37.2 C)  TempSrc: Oral Oral Oral Oral  SpO2: 95% 96% 96% 96%  Weight:   73.9 kg   Height:   5\' 8"  (1.727 m)  Intake/Output Summary (Last 24 hours) at 07/15/2019 1430 Last data filed at 07/15/2019 1225 Gross per 24 hour  Intake 720 ml  Output 950 ml  Net -230 ml   Filed Weights   07/15/19 0808  Weight: 73.9 kg    Examination:  General: 86 y.o. female resting in bed in NAD Cardiovascular: RRR, +S1, S2, no m/g/r, equal pulses throughout Respiratory: CTABL, no w/r/r, normal WOB GI: BS+, NDNT, no masses noted, no organomegaly noted MSK: No e/c/c Neuro: somnolent   Data Reviewed: I have personally reviewed following labs and imaging studies.  CBC: Recent Labs  Lab 07/12/19 1653 07/13/19 0302 07/14/19 0436 07/15/19 0233  WBC 10.3 9.9 6.6 6.0  NEUTROABS 9.0*  --  5.4  --   HGB 13.6 12.5 9.8* 9.9*  HCT 42.3 37.9 30.0* 30.7*  MCV 92.4 90.7  92.0 93.0  PLT 180 177 135* [id number]   Basic Metabolic Panel: Recent Labs  Lab 07/12/19 1653 07/13/19 0302 07/14/19 0436  NA 142 142 141  K 3.6 3.6 3.6  CL 107 107 107  CO2 27 25 24   GLUCOSE 141* 137* 121*  BUN 23 20 23   CREATININE 0.91 0.88 0.83  CALCIUM 9.1 8.3* 7.8*   GFR: Estimated Creatinine Clearance: 49.1 mL/min (by C-G formula based on SCr of 0.83 mg/dL). Liver Function Tests: Recent Labs  Lab 07/12/19 1653  AST 34  ALT 18  ALKPHOS 51  BILITOT 0.9  PROT 7.5  ALBUMIN 4.3   No results for input(s): LIPASE, AMYLASE in the last 168 hours. No results for input(s): AMMONIA in the last 168 hours. Coagulation Profile: Recent Labs  Lab 07/12/19 1653  INR 1.0   Cardiac Enzymes: No results for input(s): CKTOTAL, CKMB, CKMBINDEX, TROPONINI in the last 168 hours. BNP (last 3 results) No results for input(s): PROBNP in the last 8760 hours. HbA1C: No results for input(s): HGBA1C in the last 72 hours. CBG: No results for input(s): GLUCAP in the last 168 hours. Lipid Profile: No results for input(s): CHOL, HDL, LDLCALC, TRIG, CHOLHDL, LDLDIRECT in the last 72 hours. Thyroid Function Tests: No results for input(s): TSH, T4TOTAL, FREET4, T3FREE, THYROIDAB in the last 72 hours. Anemia Panel: No results for input(s): VITAMINB12, FOLATE, FERRITIN, TIBC, IRON, RETICCTPCT in the last 72 hours. Sepsis Labs: No results for input(s): PROCALCITON, LATICACIDVEN in the last 168 hours.  Recent Results (from the past 240 hour(s))  SARS CORONAVIRUS 2 (TAT 6-24 HRS) Nasopharyngeal Nasopharyngeal Swab     Status: None   Collection Time: 07/12/19  6:33 PM   Specimen: Nasopharyngeal Swab  Result Value Ref Range Status   SARS Coronavirus 2 NEGATIVE NEGATIVE Final    Comment: (NOTE) SARS-CoV-2 target nucleic acids are NOT DETECTED. The SARS-CoV-2 RNA is generally detectable in upper and lower respiratory specimens during the acute phase of infection. Negative results do not preclude  SARS-CoV-2 infection, do not rule out co-infections with other pathogens, and should not be used as the sole basis for treatment or other patient management decisions. Negative results must be combined with clinical observations, patient history, and epidemiological information. The expected result is Negative. Fact Sheet for Patients: SugarRoll.be Fact Sheet for Healthcare Providers: https://www.woods-mathews.com/ This test is not yet approved or cleared by the [place] FDA and  has been authorized for detection and/or diagnosis of SARS-CoV-2 by FDA under an Emergency Use Authorization (EUA). This EUA will remain  in effect (meaning this test can be used) for the duration of the COVID-19 declaration under Section 56  4(b)(1) of the Act, 21 U.S.C. section 360bbb-3(b)(1), unless the authorization is terminated or revoked sooner. Performed at [facility], [place] [address] [place], [place] [address]   Surgical pcr screen     Status: None   Collection Time: 07/13/19 12:22 AM   Specimen: Nasal Mucosa; Nasal Swab  Result Value Ref Range Status   MRSA, PCR NEGATIVE NEGATIVE Final   Staphylococcus aureus NEGATIVE NEGATIVE Final    Comment: (NOTE) The Xpert SA Assay (FDA approved for NASAL specimens in patients 41 years of age and older), is one component of a comprehensive surveillance program. It is not intended to diagnose infection nor to guide or monitor treatment. Performed at [facility], [place] [address], [place], [place] [address]       Radiology Studies: Pelvis Portable  Result Date: 07/13/2019 CLINICAL DATA:  Postop EXAM: PORTABLE PELVIS 1-2 VIEWS COMPARISON:  07/12/2019 FINDINGS: Interval left hip replacement. No fracture or malalignment. Gas in the soft tissues consistent with recent surgery. IMPRESSION: Interval left hip replacement with expected postsurgical changes Electronically Signed   By: Donavan Foil M.D.    On: 07/13/2019 15:46     Scheduled Meds: . amLODipine  2.5 mg Oral Daily  . vitamin C  1,000 mg Oral Daily  . carbidopa-levodopa  2.5 tablet Oral TID WC  . cholecalciferol  5,000 Units Oral Daily  . docusate sodium  100 mg Oral BID  . enoxaparin (LOVENOX) injection  40 mg Subcutaneous Q24H  . multivitamin with minerals  1 tablet Oral Daily  . polyethylene glycol  17 g Oral Daily  . tamsulosin  0.4 mg Oral Daily   Continuous Infusions:   LOS: 3 days    Time spent: 35 minutes spent in the coordination of care today.    Jonnie Finner, DO [facility]  If 7PM-7AM, please contact night-coverage [email or web address] 07/15/2019, 2:30 PM

## 2019-07-15 NOTE — Plan of Care (Signed)
  Problem: Clinical Measurements: Goal: Respiratory complications will improve Outcome: Progressing Note: On room air   Problem: Nutrition: Goal: Adequate nutrition will be maintained Outcome: Progressing   Problem: Coping: Goal: Level of anxiety will decrease Outcome: Progressing   Problem: Elimination: Goal: Will not experience complications related to bowel motility Outcome: Progressing Note: BM this shift   Problem: Pain Managment: Goal: General experience of comfort will improve Outcome: Progressing   Problem: Safety: Goal: Ability to remain free from injury will improve Outcome: Progressing

## 2019-07-15 NOTE — Progress Notes (Signed)
Initial Nutrition Assessment  DOCUMENTATION CODES:   Not applicable  INTERVENTION:   -Feeding assistance with meals -MVI with minerals daily  NUTRITION DIAGNOSIS:   Increased nutrient needs related to post-op healing as evidenced by estimated needs.  GOAL:   Patient will meet greater than or equal to 90% of their needs   MONITOR:   PO intake, Supplement acceptance, Labs, Weight trends, Skin, I & O's  REASON FOR ASSESSMENT:   Malnutrition Screening Tool    ASSESSMENT:   Kaitlyn Ortiz is a 86 y.o. female with medical history significant of Parkinson's disease with memory deficit, CAD, HTN, Glaucoma, Bronchiectasis who presents for pain in the hip and inability to walk.  Per family, daughter Kathlee Nations, patient is a patient at the memory care unit at [facility].  She fell yesterday evening.  At that time, the patient reported feeling fine and the staff put her to bed like normal.  However, when she awoke this morning she had pain in the left hip and was unable to walk.  An xray was done on site and showed a hip fracture and she was sent to the ED.  In the ED, she was noted to have a hip fracture on xray.  She did not hit her head as far as the report went and slept normally.  She has had PD for about 3 years.  Walks with a walker.  Daughter is unsure if she has had recent worsening of mobility issues.  3/15- s/p PROCEDURE: Left hip hemiarthroplasty, anterior approach  Reviewed I/O's: -700 ml x 24 hours and -3.2 L since admission  UOP: 1.3 L x 24 hours  Pt sitting up in bed, consuming lunch at time of visit. Pt made eye contact with this RD and answered questions, however, typically answered inappropriately. Pt daughter commented that pt is much more lucid today that in previous hospital days.   Per pt daughter, pt had a good appetite PTA and was not on any dietary restrictions. Pt daughter is unsure if pt has lost weight, but is doubtful ("they would have notified me about that").  Pt's daughter's only concern is that pt often needs assistance with feeding and tray set up, which does not always occur. Observed pt consuming spaghetti, however, may food particles fell off fork when self-feeding. Noted meal completion 25-100%.   Per wt hx, wt has been stable over the past 2 years.   Per discussion with daughter, discharge was helpd yesterday secondary to inability to void. Plan to d/c back to [facility] once medically stable.   Labs reviewed.   NUTRITION - FOCUSED PHYSICAL EXAM:    Most Recent Value  Orbital Region  No depletion  Upper Arm Region  No depletion  Thoracic and Lumbar Region  No depletion  Buccal Region  No depletion  Temple Region  No depletion  Clavicle Bone Region  No depletion  Clavicle and Acromion Bone Region  No depletion  Scapular Bone Region  No depletion  Dorsal Hand  No depletion  Patellar Region  No depletion  Anterior Thigh Region  No depletion  Posterior Calf Region  No depletion  Edema (RD Assessment)  None  Hair  Reviewed  Eyes  Reviewed  Mouth  Reviewed  Skin  Reviewed  Nails  Reviewed      Diet Order:   Diet Order            Diet - low sodium heart healthy        Diet regular Room service  appropriate? Yes; Fluid consistency: Thin  Diet effective now              EDUCATION NEEDS:   No education needs have been identified at this time  Skin:     Last BM:     Height:   Ht Readings from Last 1 Encounters:  07/15/19 5\' 8"  (1.727 m)    Weight:   Wt Readings from Last 1 Encounters:  07/15/19 73.9 kg    Ideal Body Weight:  63.6 kg  BMI:  Body mass index is 24.77 kg/m.  Estimated Nutritional Needs:   Kcal:  1650-1850  Protein:  80-95 grams  Fluid:  > 1.6 L    Loistine Chance, RD, LDN, [place] Registered Dietitian II Certified Diabetes Care and Education Specialist Please refer to [facility] for RD and/or RD on-call/weekend/after hours pager

## 2019-07-16 LAB — CBC
HCT: 29.8 % — ABNORMAL LOW (ref 36.0–46.0)
Hemoglobin: 9.9 g/dL — ABNORMAL LOW (ref 12.0–15.0)
MCH: 30.5 pg (ref 26.0–34.0)
MCHC: 33.2 g/dL (ref 30.0–36.0)
MCV: 91.7 fL (ref 80.0–100.0)
Platelets: 164 10*3/uL (ref 150–400)
RBC: 3.25 MIL/uL — ABNORMAL LOW (ref 3.87–5.11)
RDW: 15.1 % (ref 11.5–15.5)
WBC: 5.7 10*3/uL (ref 4.0–10.5)
nRBC: 0 % (ref 0.0–0.2)

## 2019-07-16 LAB — MAGNESIUM: Magnesium: 2.2 mg/dL (ref 1.7–2.4)

## 2019-07-16 LAB — COMPREHENSIVE METABOLIC PANEL
ALT: 25 U/L (ref 0–44)
AST: 61 U/L — ABNORMAL HIGH (ref 15–41)
Albumin: 2.6 g/dL — ABNORMAL LOW (ref 3.5–5.0)
Alkaline Phosphatase: 39 U/L (ref 38–126)
Anion gap: 9 (ref 5–15)
BUN: 15 mg/dL (ref 8–23)
CO2: 25 mmol/L (ref 22–32)
Calcium: 7.9 mg/dL — ABNORMAL LOW (ref 8.9–10.3)
Chloride: 105 mmol/L (ref 98–111)
Creatinine, Ser: 0.71 mg/dL (ref 0.44–1.00)
GFR calc Af Amer: >60 mL/min (ref >60)
GFR calc non Af Amer: >60 mL/min (ref >60)
Glucose, Bld: 115 mg/dL — ABNORMAL HIGH (ref 70–99)
Potassium: 3.8 mmol/L (ref 3.5–5.1)
Sodium: 139 mmol/L (ref 135–145)
Total Bilirubin: 0.8 mg/dL (ref 0.3–1.2)
Total Protein: 5.4 g/dL — ABNORMAL LOW (ref 6.5–8.1)

## 2019-07-16 LAB — SARS CORONAVIRUS 2 (TAT 6-24 HRS): SARS Coronavirus 2: NEGATIVE

## 2019-07-16 NOTE — Progress Notes (Signed)
Report called to [facility] at [place], pt returning to room 315. Informed Andee Poles pt will be returning with foley in place and they will do a voiding trail at the facility in 3-5 days. AVS printed. Awaiting [facility] for transport. Daughter at bedside who was updated.

## 2019-07-16 NOTE — Discharge Summary (Signed)
. Physician Discharge Summary  Kaitlyn Ortiz [id number] DOB: 1931-06-25 DOA: 07/12/2019  PCP: Gayland Curry, DO  Admit date: 07/12/2019 Discharge date: 07/16/2019  Admitted From: [facility] Disposition:  Memory care with skilled nursing  Recommendations for Outpatient Follow-up:  1. Follow up site physician in next 3 - 5 days 2. Please obtain BMP/CBC in one week 3. Voiding trial in next 3 - 5 days; if she fails, reintroduce foley and refer to urology. 4. Follow up with orthopedics in 2 weeks.    Discharge Condition: Stable  CODE STATUS: DNR   Brief/Interim Summary: Patient is a 86 year old female with history of Parkinson's disease with memory deficit, coronary disease, hypertension, glaucoma, bronchiectasis who presented with left hip pain, unable to ambulate. Patient resides in memory care unit at [facility]. There was report of a fall. On presentation, she was hemodynamically stable. X-ray done on presentation showed left hip fracture. Orthopedics consulted, she underwentleft hiphemiarthroplasty on 07/13/19.PT recommended skilled nursing facility. 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  07/16/19: Doing well today. Still retaining urine. Will continue foley at discharge. She needs to have a voiding trial in the next 3 - 5 days. If she fails that trial, she will need referral to a urologist. She is otherwise stable for discharge.   Discharge Diagnoses:  Active Problems:   Essential hypertension   Bronchiectasis (HCC)   Coronary artery disease with history of myocardial infarction without history of CABG   Hyperlipidemia LDL goal <100   Thoracic aortic aneurysm without rupture (HCC)   Dementia without behavioral disturbance (HCC)   Parkinsonism ([place])   Recurrent falls   Osteopenia   Hip fracture (HCC)  Left hip fracture     - Found to have left femoral neck fracture secondary to mechanical fall.    - Orthopedics consulted and she underwent  ORIF.       - Started on DVT prophylaxis with aspirin twice a day which will be continued for 6 weeks.  Continue pain management.     - Follow-up with orthopedics in 2 weeks.     - She needs to be evaluated by PT and OT at memory care and get therapy.  Dementia/parkinsonism/recurrent falls     - Lives in memory care.     - Needs skilled facility ondischarge.      - Continue supportive care.      - On Sinemet, donepezil  Hypertension     - Continue amlodipine  History of bronchiectasis     - Currently respiratory status stable.      - Continue home inhalers  Coronary artery disease     - No chest pain at present.     - continue ASA, resume statin  Hyperlipidemia     - resume pravastatin  Acute normocytic anemia     - Most likely associatedwith surgery.     - follow CBC  Acute urinary retention     - I&O'd several times over last 24 hrs     - add flomax, check renal US     - will likely need to place foley     - 07/16/19: she will discharge with foley in place. She will need a voiding trial in next 3 - 5 days. If she fails, she will need referral to urology  Discharge Instructions  Discharge Instructions    Diet - low sodium heart healthy   Complete by: As directed    Discharge instructions   Complete by: As directed  1)Take prescribed medication as instructed. 2)Follow up with orthopedics in 2 weeks.  Name and number of the provider has been attached. 3)Check CBC in a week.   Increase activity slowly   Complete by: As directed      Allergies as of 07/16/2019      Reactions   Caffeine       Medication List    TAKE these medications   albuterol 0.63 MG/3ML nebulizer solution Commonly known as: ACCUNEB Take 1 ampule by nebulization 2 (two) times daily.   amLODipine 2.5 MG tablet Commonly known as: NORVASC Take 1 tablet by mouth daily.   aspirin 81 MG chewable tablet Chew 1 tablet (81 mg total) by mouth 2 (two) times daily at 10 AM and 5 PM.  Continue taking once a day after 6 weeks What changed:   when to take this  additional instructions   bisacodyl 10 MG suppository Commonly known as: DULCOLAX Place 10 mg rectally as needed for moderate constipation.   carbidopa-levodopa 25-100 MG tablet Commonly known as: SINEMET IR Take 2.5 tabs TID with meals   donepezil 5 MG tablet Commonly known as: ARICEPT Take 1 tablet (5 mg total) by mouth at bedtime.   GLUCOSAMINE 1500 COMPLEX PO Take 1 tablet by mouth in the morning and at bedtime.   HYDROcodone-acetaminophen 5-325 MG tablet Commonly known as: NORCO/VICODIN Take 1 tablet by mouth every 4 (four) hours as needed for moderate pain.   latanoprost 0.005 % ophthalmic solution Commonly known as: XALATAN 1 drop at bedtime.   Lutein 20 MG Tabs Take 20 mg by mouth at bedtime.   multivitamin tablet Take 1 tablet by mouth daily.   polyethylene glycol 17 g packet Commonly known as: MIRALAX / GLYCOLAX Take 17 g by mouth daily.   pravastatin 40 MG tablet Commonly known as: PRAVACHOL TAKE 1 TABLET ONCE DAILY.   PreserVision AREDS 2+Multi Vit Caps Take by mouth 2 (two) times daily.   Senexon-S 8.6-50 MG tablet Generic drug: senna-docusate Take 1 tablet by mouth daily as needed.   tamsulosin 0.4 MG Caps capsule Commonly known as: FLOMAX Take 1 capsule (0.4 mg total) by mouth daily.   vitamin C 1000 MG tablet Take 1,000 mg by mouth daily.   Vitamin D3 125 MCG (5000 UT) Caps Take 1 capsule (5,000 Units total) by mouth daily.      Follow-up Information    Swinteck, Aaron Edelman, MD. Schedule an appointment as soon as possible for a visit in 2 weeks.   Specialty: Orthopedic Surgery Why: For wound re-check Contact information: [address] [place] [address] [phone]          Allergies  Allergen Reactions  . Caffeine     Consultations:  Orthopedics   Procedures/Studies: DG Chest 1 View  Result Date: 07/12/2019 CLINICAL DATA:   Fall, unwitnessed, yesterday EXAM: CHEST  1 VIEW COMPARISON:  11/14/2017 chest radiograph. FINDINGS: Stable cardiomediastinal silhouette with normal heart size. No pneumothorax. No pleural effusion. Lungs appear clear, with no acute consolidative airspace disease and no pulmonary edema. IMPRESSION: No active disease. Electronically Signed   By: Ilona Sorrel M.D.   On: 07/12/2019 17:47   DG Knee 1-2 Views Left  Result Date: 07/13/2019 CLINICAL DATA:  Closed fracture EXAM: LEFT KNEE - 1-2 VIEW COMPARISON:  None. FINDINGS: No fracture of the proximal tibia or distal femur. Patella is normal. No joint effusion. Narrowing of the lateral compartment with associated osteophytosis. No joint effusion. IMPRESSION: 1. No acute findings  LEFT knee. 2. Osteoarthritis of the lateral compartment. Electronically Signed   By: Suzy Bouchard M.D.   On: 07/13/2019 09:59   CT Head Wo Contrast  Result Date: 07/12/2019 CLINICAL DATA:  86 year old female with headache and neck pain following fall yesterday. Initial encounter. EXAM: CT HEAD WITHOUT CONTRAST CT CERVICAL SPINE WITHOUT CONTRAST TECHNIQUE: Multidetector CT imaging of the head and cervical spine was performed following the standard protocol without intravenous contrast. Multiplanar CT image reconstructions of the cervical spine were also generated. COMPARISON:  11/14/2017 CT FINDINGS: CT HEAD FINDINGS Brain: No evidence of acute infarction, hemorrhage, hydrocephalus, extra-axial collection or mass lesion/mass effect. Atrophy and chronic small-vessel white matter ischemic changes again noted. Vascular: Carotid atherosclerotic calcifications are noted. Skull: Normal. Negative for fracture or focal lesion. Sinuses/Orbits: No acute finding. Other: None. CT CERVICAL SPINE FINDINGS Alignment: Normal. Skull base and vertebrae: No acute fracture. No primary bone lesion or focal pathologic process. Soft tissues and spinal canal: No prevertebral fluid or swelling. No visible  canal hematoma. Disc levels: Mild multilevel degenerative disc disease/spondylosis, greatest at C5-C6 again noted. Mild to moderate multilevel facet arthropathy again identified. Upper chest: No acute abnormality. Other: None IMPRESSION: 1. No evidence of acute intracranial abnormality. Atrophy and chronic small-vessel white matter ischemic changes. 2. No static evidence of acute injury to the cervical spine. Electronically Signed   By: Margarette Canada M.D.   On: 07/12/2019 17:30   CT Cervical Spine Wo Contrast  Result Date: 07/12/2019 CLINICAL DATA:  86 year old female with headache and neck pain following fall yesterday. Initial encounter. EXAM: CT HEAD WITHOUT CONTRAST CT CERVICAL SPINE WITHOUT CONTRAST TECHNIQUE: Multidetector CT imaging of the head and cervical spine was performed following the standard protocol without intravenous contrast. Multiplanar CT image reconstructions of the cervical spine were also generated. COMPARISON:  11/14/2017 CT FINDINGS: CT HEAD FINDINGS Brain: No evidence of acute infarction, hemorrhage, hydrocephalus, extra-axial collection or mass lesion/mass effect. Atrophy and chronic small-vessel white matter ischemic changes again noted. Vascular: Carotid atherosclerotic calcifications are noted. Skull: Normal. Negative for fracture or focal lesion. Sinuses/Orbits: No acute finding. Other: None. CT CERVICAL SPINE FINDINGS Alignment: Normal. Skull base and vertebrae: No acute fracture. No primary bone lesion or focal pathologic process. Soft tissues and spinal canal: No prevertebral fluid or swelling. No visible canal hematoma. Disc levels: Mild multilevel degenerative disc disease/spondylosis, greatest at C5-C6 again noted. Mild to moderate multilevel facet arthropathy again identified. Upper chest: No acute abnormality. Other: None IMPRESSION: 1. No evidence of acute intracranial abnormality. Atrophy and chronic small-vessel white matter ischemic changes. 2. No static evidence of  acute injury to the cervical spine. Electronically Signed   By: Margarette Canada M.D.   On: 07/12/2019 17:30   US RENAL  Result Date: 07/15/2019 CLINICAL DATA:  Urinary retention. EXAM: RENAL / URINARY TRACT ULTRASOUND COMPLETE COMPARISON:  MRI abdomen dated June 05, 2019. FINDINGS: Right Kidney: Renal measurements: 10.6 x 4.7 x 5.1 cm = volume: 132 mL . Echogenicity within normal limits. Unchanged 3.3 cm simple cyst in the upper pole. No mass or hydronephrosis visualized. Left Kidney: Renal measurements: 11.7 x 5.1 x 5.3 cm = volume: 163 mL. Echogenicity within normal limits. Two cysts measuring up to 5.5 cm are unchanged. No mass or hydronephrosis visualized. Bladder: Not visualized. Other: None. IMPRESSION: 1. No acute abnormality. 2. Unchanged bilateral renal cysts. Electronically Signed   By: Titus Dubin M.D.   On: 07/15/2019 15:39   Pelvis Portable  Result Date: 07/13/2019 CLINICAL DATA:  Postop  EXAM: PORTABLE PELVIS 1-2 VIEWS COMPARISON:  07/12/2019 FINDINGS: Interval left hip replacement. No fracture or malalignment. Gas in the soft tissues consistent with recent surgery. IMPRESSION: Interval left hip replacement with expected postsurgical changes Electronically Signed   By: Donavan Foil M.D.   On: 07/13/2019 15:46   DG C-Arm 1-60 Min  Result Date: 07/13/2019 CLINICAL DATA:  Status post left hemiarthroplasty. EXAM: OPERATIVE left HIP (WITH PELVIS IF PERFORMED) 2 VIEWS TECHNIQUE: Fluoroscopic spot image(s) were submitted for interpretation post-operatively. FLUOROSCOPY TIME:  19 seconds. COMPARISON:  None. FINDINGS: Two intraoperative fluoroscopic images of the left hip demonstrate left hemiarthroplasty in grossly good position. IMPRESSION: Status post left hip hemiarthroplasty. Electronically Signed   By: Marijo Conception M.D.   On: 07/13/2019 14:14   DG HIP OPERATIVE UNILAT WITH PELVIS LEFT  Result Date: 07/13/2019 CLINICAL DATA:  Status post left hemiarthroplasty. EXAM: OPERATIVE left HIP  (WITH PELVIS IF PERFORMED) 2 VIEWS TECHNIQUE: Fluoroscopic spot image(s) were submitted for interpretation post-operatively. FLUOROSCOPY TIME:  19 seconds. COMPARISON:  None. FINDINGS: Two intraoperative fluoroscopic images of the left hip demonstrate left hemiarthroplasty in grossly good position. IMPRESSION: Status post left hip hemiarthroplasty. Electronically Signed   By: Marijo Conception M.D.   On: 07/13/2019 14:14   DG Hip Unilat W or Wo Pelvis 2-3 Views Left  Result Date: 07/12/2019 CLINICAL DATA:  Unwitnessed fall yesterday EXAM: DG HIP (WITH OR WITHOUT PELVIS) 2-3V LEFT COMPARISON:  11/14/2017 left hip radiographs FINDINGS: Subcapital left femoral neck fracture with 2 cm over riding of the fracture fragments and 2 cm lateral displacement of the distal fracture fragment. No dislocation of the left femoral head fracture fragment at the left hip joint. No additional fractures. No pelvic diastasis. No focal osseous lesions. IMPRESSION: Displaced subcapital left femoral neck fracture as detailed. Electronically Signed   By: Ilona Sorrel M.D.   On: 07/12/2019 17:49      Subjective: No acute events ON.   Discharge Exam: Vitals:   07/15/19 1953 07/16/19 0411  BP: 131/71 (!) 151/89  Pulse: 67 79  Resp: 16 16  Temp: 98.8 F (37.1 C) 98.2 F (36.8 C)  SpO2: 96% 94%   Vitals:   07/15/19 0808 07/15/19 1347 07/15/19 1953 07/16/19 0411  BP: (!) 151/73 127/72 131/71 (!) 151/89  Pulse: 75 73 67 79  Resp: 18 18 16 16   Temp: 98.8 F (37.1 C) 98.9 F (37.2 C) 98.8 F (37.1 C) 98.2 F (36.8 C)  TempSrc: Oral Oral Oral Oral  SpO2: 96% 96% 96% 94%  Weight: 73.9 kg     Height: 5\' 8"  (1.727 m)       General: 86 y.o. female resting in chair in NAD Cardiovascular: RRR, +S1, S2, no m/g/r, equal pulses throughout Respiratory: CTABL, no w/r/r, normal WOB GI: BS+, NDNT, no masses noted, no organomegaly noted MSK: No e/c/c Neuro: Alert and following commands   The results of significant  diagnostics from this hospitalization (including imaging, microbiology, ancillary and laboratory) are listed below for reference.     Microbiology: Recent Results (from the past 240 hour(s))  SARS CORONAVIRUS 2 (TAT 6-24 HRS) Nasopharyngeal Nasopharyngeal Swab     Status: None   Collection Time: 07/12/19  6:33 PM   Specimen: Nasopharyngeal Swab  Result Value Ref Range Status   SARS Coronavirus 2 NEGATIVE NEGATIVE Final    Comment: (NOTE) SARS-CoV-2 target nucleic acids are NOT DETECTED. The SARS-CoV-2 RNA is generally detectable in upper and lower respiratory specimens during the acute phase  of infection. Negative results do not preclude SARS-CoV-2 infection, do not rule out co-infections with other pathogens, and should not be used as the sole basis for treatment or other patient management decisions. Negative results must be combined with clinical observations, patient history, and epidemiological information. The expected result is Negative. Fact Sheet for Patients: SugarRoll.be Fact Sheet for Healthcare Providers: https://www.woods-mathews.com/ This test is not yet approved or cleared by the [place] FDA and  has been authorized for detection and/or diagnosis of SARS-CoV-2 by FDA under an Emergency Use Authorization (EUA). This EUA will remain  in effect (meaning this test can be used) for the duration of the COVID-19 declaration under Section 56 4(b)(1) of the Act, 21 U.S.C. section 360bbb-3(b)(1), unless the authorization is terminated or revoked sooner. Performed at [facility], [place] [address], [place], [place] [address]   Surgical pcr screen     Status: None   Collection Time: 07/13/19 12:22 AM   Specimen: Nasal Mucosa; Nasal Swab  Result Value Ref Range Status   MRSA, PCR NEGATIVE NEGATIVE Final   Staphylococcus aureus NEGATIVE NEGATIVE Final    Comment: (NOTE) The Xpert SA Assay (FDA approved for NASAL specimens in  patients 81 years of age and older), is one component of a comprehensive surveillance program. It is not intended to diagnose infection nor to guide or monitor treatment. Performed at [facility], [place] [address] [place], [place] [address]      Labs: BNP (last 3 results) No results for input(s): BNP in the last 8760 hours. Basic Metabolic Panel: Recent Labs  Lab 07/12/19 1653 07/13/19 0302 07/14/19 0436 07/16/19 0419  NA 142 142 141 139  K 3.6 3.6 3.6 3.8  CL 107 107 107 105  CO2 27 25 24 25   GLUCOSE 141* 137* 121* 115*  BUN 23 20 23 15   CREATININE 0.91 0.88 0.83 0.71  CALCIUM 9.1 8.3* 7.8* 7.9*  MG  --   --   --  2.2   Liver Function Tests: Recent Labs  Lab 07/12/19 1653 07/16/19 0419  AST 34 61*  ALT 18 25  ALKPHOS 51 39  BILITOT 0.9 0.8  PROT 7.5 5.4*  ALBUMIN 4.3 2.6*   No results for input(s): LIPASE, AMYLASE in the last 168 hours. No results for input(s): AMMONIA in the last 168 hours. CBC: Recent Labs  Lab 07/12/19 1653 07/13/19 0302 07/14/19 0436 07/15/19 0233 07/16/19 0419  WBC 10.3 9.9 6.6 6.0 5.7  NEUTROABS 9.0*  --  5.4  --   --   HGB 13.6 12.5 9.8* 9.9* 9.9*  HCT 42.3 37.9 30.0* 30.7* 29.8*  MCV 92.4 90.7 92.0 93.0 91.7  PLT 180 177 135* 150 164   Cardiac Enzymes: No results for input(s): CKTOTAL, CKMB, CKMBINDEX, TROPONINI in the last 168 hours. BNP: Invalid input(s): POCBNP CBG: No results for input(s): GLUCAP in the last 168 hours. D-Dimer No results for input(s): DDIMER in the last 72 hours. Hgb A1c No results for input(s): HGBA1C in the last 72 hours. Lipid Profile No results for input(s): CHOL, HDL, LDLCALC, TRIG, CHOLHDL, LDLDIRECT in the last 72 hours. Thyroid function studies No results for input(s): TSH, T4TOTAL, T3FREE, THYROIDAB in the last 72 hours.  Invalid input(s): FREET3 Anemia work up No results for input(s): VITAMINB12, FOLATE, FERRITIN, TIBC, IRON, RETICCTPCT in the last 72 hours. Urinalysis     Component Value Date/Time   COLORURINE BROWN (A) 07/12/2019 2005   APPEARANCEUR TURBID (A) 07/12/2019 2005  LABSPEC 1.015 07/12/2019 2005   PHURINE 8.5 (H) 07/12/2019 2005   GLUCOSEU NEGATIVE 07/12/2019 2005   HGBUR LARGE (A) 07/12/2019 2005   BILIRUBINUR MODERATE (A) 07/12/2019 2005   KETONESUR TRACE (A) 07/12/2019 2005   PROTEINUR 100 (A) 07/12/2019 2005   NITRITE POSITIVE (A) 07/12/2019 2005   LEUKOCYTESUR MODERATE (A) 07/12/2019 2005   Sepsis Labs Invalid input(s): PROCALCITONIN,  WBC,  LACTICIDVEN Microbiology Recent Results (from the past 240 hour(s))  SARS CORONAVIRUS 2 (TAT 6-24 HRS) Nasopharyngeal Nasopharyngeal Swab     Status: None   Collection Time: 07/12/19  6:33 PM   Specimen: Nasopharyngeal Swab  Result Value Ref Range Status   SARS Coronavirus 2 NEGATIVE NEGATIVE Final    Comment: (NOTE) SARS-CoV-2 target nucleic acids are NOT DETECTED. The SARS-CoV-2 RNA is generally detectable in upper and lower respiratory specimens during the acute phase of infection. Negative results do not preclude SARS-CoV-2 infection, do not rule out co-infections with other pathogens, and should not be used as the sole basis for treatment or other patient management decisions. Negative results must be combined with clinical observations, patient history, and epidemiological information. The expected result is Negative. Fact Sheet for Patients: SugarRoll.be Fact Sheet for Healthcare Providers: https://www.woods-mathews.com/ This test is not yet approved or cleared by the [place] FDA and  has been authorized for detection and/or diagnosis of SARS-CoV-2 by FDA under an Emergency Use Authorization (EUA). This EUA will remain  in effect (meaning this test can be used) for the duration of the COVID-19 declaration under Section 56 4(b)(1) of the Act, 21 U.S.C. section 360bbb-3(b)(1), unless the authorization is terminated or revoked  sooner. Performed at [facility], [place] [address] [place], [place] [address]   Surgical pcr screen     Status: None   Collection Time: 07/13/19 12:22 AM   Specimen: Nasal Mucosa; Nasal Swab  Result Value Ref Range Status   MRSA, PCR NEGATIVE NEGATIVE Final   Staphylococcus aureus NEGATIVE NEGATIVE Final    Comment: (NOTE) The Xpert SA Assay (FDA approved for NASAL specimens in patients 48 years of age and older), is one component of a comprehensive surveillance program. It is not intended to diagnose infection nor to guide or monitor treatment. Performed at [facility], [place] [address], [place], [place] [address]      Time coordinating discharge: 35 minutes  SIGNED:   Jonnie Finner, DO  [facility] 07/16/2019, 7:47 AM   If 7PM-7AM, please contact night-coverage [email or web address]

## 2019-07-16 NOTE — Discharge Instructions (Signed)
Dr. Rod Can Joint Replacement Specialist [facility] [address] [place], [place] [address] [phone]   TOTAL HIP REPLACEMENT POSTOPERATIVE DIRECTIONS    Hip Rehabilitation, Guidelines Following Surgery   WEIGHT BEARING Weight bearing as tolerated with assist device (walker, cane, etc) as directed, use it as long as suggested by your surgeon or therapist, typically at least 4-6 weeks.  The results of a hip operation are greatly improved after range of motion and muscle strengthening exercises. Follow all safety measures which are given to protect your hip. If any of these exercises cause increased pain or swelling in your joint, decrease the amount until you are comfortable again. Then slowly increase the exercises. Call your caregiver if you have problems or questions.   HOME CARE INSTRUCTIONS  Most of the following instructions are designed to prevent the dislocation of your new hip.  Remove items at home which could result in a fall. This includes throw rugs or furniture in walking pathways.  Continue medications as instructed at time of discharge.  You may have some home medications which will be placed on hold until you complete the course of blood thinner medication.  You may start showering once you are discharged home. Do not remove your dressing. Do not put on socks or shoes without following the instructions of your caregivers.   Sit on chairs with arms. Use the chair arms to help push yourself up when arising.  Arrange for the use of a toilet seat elevator so you are not sitting low.   Walk with walker as instructed.  You may resume a sexual relationship in one month or when given the OK by your caregiver.  Use walker as long as suggested by your caregivers.  You may put full weight on your legs and walk as much as is comfortable. Avoid periods of inactivity such as sitting longer than an hour when not asleep. This helps prevent  blood clots.  You may return to work once you are cleared by Engineer, production.  Do not drive a car for 6 weeks or until released by your surgeon.  Do not drive while taking narcotics.  Wear elastic stockings for two weeks following surgery during the day but you may remove then at night.  Make sure you keep all of your appointments after your operation with all of your doctors and caregivers. You should call the office at the above phone number and make an appointment for approximately two weeks after the date of your surgery. Please pick up a stool softener and laxative for home use as long as you are requiring pain medications.  ICE to the affected hip every three hours for 30 minutes at a time and then as needed for pain and swelling. Continue to use ice on the hip for pain and swelling from surgery. You may notice swelling that will progress down to the foot and ankle.  This is normal after surgery.  Elevate the leg when you are not up walking on it.   It is important for you to complete the blood thinner medication as prescribed by your doctor.  Continue to use the breathing machine which will help keep your temperature down.  It is common for your temperature to cycle up and down following surgery, especially at night when you are not up moving around and exerting yourself.  The breathing machine keeps your lungs expanded and your temperature down.  RANGE OF MOTION AND STRENGTHENING EXERCISES  These exercises  are designed to help you keep full movement of your hip joint. Follow your caregiver's or physical therapist's instructions. Perform all exercises about fifteen times, three times per day or as directed. Exercise both hips, even if you have had only one joint replacement. These exercises can be done on a training (exercise) mat, on the floor, on a table or on a bed. Use whatever works the best and is most comfortable for you. Use music or television while you are exercising so that the exercises  are a pleasant break in your day. This will make your life better with the exercises acting as a break in routine you can look forward to.  Lying on your back, slowly slide your foot toward your buttocks, raising your knee up off the floor. Then slowly slide your foot back down until your leg is straight again.  Lying on your back spread your legs as far apart as you can without causing discomfort.  Lying on your side, raise your upper leg and foot straight up from the floor as far as is comfortable. Slowly lower the leg and repeat.  Lying on your back, tighten up the muscle in the front of your thigh (quadriceps muscles). You can do this by keeping your leg straight and trying to raise your heel off the floor. This helps strengthen the largest muscle supporting your knee.  Lying on your back, tighten up the muscles of your buttocks both with the legs straight and with the knee bent at a comfortable angle while keeping your heel on the floor.   SKILLED REHAB INSTRUCTIONS: If the patient is transferred to a skilled rehab facility following release from the hospital, a list of the current medications will be sent to the facility for the patient to continue.  When discharged from the skilled rehab facility, please have the facility set up the patient's [place] prior to being released. Also, the skilled facility will be responsible for providing the patient with their medications at time of release from the facility to include their pain medication and their blood thinner medication. If the patient is still at the rehab facility at time of the two week follow up appointment, the skilled rehab facility will also need to assist the patient in arranging follow up appointment in our office and any transportation needs.  MAKE SURE YOU:  Understand these instructions.  Will watch your condition.  Will get help right away if you are not doing well or get worse.  Pick up stool softner and  laxative for home use following surgery while on pain medications. Do not remove your dressing. The dressing is waterproof--it is OK to take showers. Do not soak or submerge. Continue to use ice for pain and swelling after surgery. Do not use any lotions or creams on the incision until instructed by your surgeon. Total Hip Protocol.

## 2019-07-16 NOTE — TOC Transition Note (Signed)
Transition of Care [facility]) - CM/SW Discharge Note   Patient Details  Name: Kaitlyn Ortiz MRN: [id number] Date of Birth: 31-May-1931  Transition of Care [facility]) CM/SW Contact:  Sharin Mons, RN Phone Number: 07/16/2019, 12:39 PM   Clinical Narrative:    Patient will DC to: [facility] Anticipated DC date: 07/16/2019 Family notified: [name] Transport by: Corey Harold   Per MD patient ready for DC today . RN, patient, patient's family, and facility notified of DC. Discharge Summary and FL2 sent to facility. RN to call report prior to discharge [phone]). Rm# [id number] packet on chart. Ambulance transport requested for patient.   RNCM will sign off for now as intervention is no longer needed. Please consult [place] again if new needs arise.    Final next level of care: Skilled Nursing Facility Barriers to Discharge: No Barriers Identified   Patient Goals and CMS Choice        Discharge Placement                       Discharge Plan and Services                                     Social Determinants of Health (SDOH) Interventions     Readmission Risk Interventions No flowsheet data found.

## 2019-07-16 NOTE — NC FL2 (Addendum)
[place] MEDICAID FL2 LEVEL OF CARE SCREENING TOOL     IDENTIFICATION  Patient Name: Kaitlyn Ortiz Birthdate: 01-05-1932 Sex: female Admission Date (Current Location): 07/12/2019  [facility] and [place] Number:  Herbalist and Address:  [place], [place] [address] [place], [place] [address]      Provider Number: [id number]  Attending Physician Name and Address:  Jonnie Finner, DO  Relative Name and Phone Number:  Hilarie Fredrickson ([phone]    Current Level of Care: Hospital Recommended Level of Care: [place] Prior Approval Number:    Date Approved/Denied:   PASRR Number:    Discharge Plan: SNF    Current Diagnoses: Patient Active Problem List   Diagnosis Date Noted  . Hip fracture ([place]) 07/12/2019  . Fall   . Left displaced femoral neck fracture ([place])   . Multiple system atrophy with predominant parkinsonism ([place]) 06/12/2019  . Constipation due to neurogenic bowel 06/12/2019  . Senile osteoporosis 06/12/2019  . Venous insufficiency 06/12/2019  . Depression, major, single episode, moderate ([place]) 11/05/2018  . Paranoid delusion ([place]) 11/05/2018  . Neurogenic orthostatic hypotension ([place]) 11/05/2018  . Osteopenia 02/26/2018  . Normocytic normochromic anemia 11/15/2017  . Overactive bladder 11/04/2017  . Parkinsonism ([place]) 11/04/2017  . Recurrent falls 11/04/2017  . Left lateral knee pain 08/07/2017  . Dilated aortic root ([place]) 05/02/2017  . Benign hypertension   . Heart attack ([place])   . Dementia without behavioral disturbance ([place]) 04/10/2017  . Pain of toe 04/10/2017  . Essential hypertension 12/26/2016  . Bronchiectasis ([place]) 12/26/2016  . Glaucoma 12/26/2016  . Urinary incontinence 12/26/2016  . Coronary artery disease with history of myocardial infarction without history of CABG 09/18/2016  . Thoracic aortic aneurysm without rupture ([place]) 08/28/2016  . Hyperlipidemia LDL goal <100  08/16/2016    Orientation RESPIRATION BLADDER Height & Weight     Self  Normal Indwelling catheter(Pt discharging with foley. Pt will need outpatient voiding trial or follow up with an urologist) Weight: 73.9 kg Height:  5\' 8"  (172.7 cm)  BEHAVIORAL SYMPTOMS/MOOD NEUROLOGICAL BOWEL NUTRITION STATUS      Incontinent Diet(heart healthy)  AMBULATORY STATUS COMMUNICATION OF NEEDS Skin   Extensive Assist Verbally Normal, Other (Comment)                       Personal Care Assistance Level of Assistance  Bathing, Feeding, Dressing Bathing Assistance: Maximum assistance Feeding assistance: Limited assistance Dressing Assistance: Maximum assistance     Functional Limitations Info  Sight, Hearing, Speech Sight Info: Adequate Hearing Info: Adequate Speech Info: Adequate    SPECIAL CARE FACTORS FREQUENCY  PT (By licensed PT), OT (By licensed OT)     PT Frequency: 5x/ week, evaluate and treat OT Frequency: 5x/ week, evaluate and treat            Contractures Contractures Info: Not present    Additional Factors Info  Code Status, Allergies Code Status Info: DNR Allergies Info: Caffeine           Current Medications (07/16/2019):  This is the current hospital active medication list Current Facility-Administered Medications  Medication Dose Route Frequency Provider Last Rate Last Admin  . acetaminophen (TYLENOL) tablet 650 mg  650 mg Oral Q6H PRN Rod Can, MD   650 mg at 07/16/19 0815   Or  . acetaminophen (TYLENOL) suppository 650 mg  650 mg Rectal Q6H PRN Rod Can, MD      .  amLODipine (NORVASC) tablet 2.5 mg  2.5 mg Oral Daily Swinteck, Aaron Edelman, MD   2.5 mg at 07/15/19 1058  . ascorbic acid (VITAMIN C) tablet 1,000 mg  1,000 mg Oral Daily Rod Can, MD   1,000 mg at 07/15/19 1058  . aspirin EC tablet 81 mg  81 mg Oral BID Cherylann Ratel A, DO   81 mg at 07/15/19 2204  . carbidopa-levodopa (SINEMET IR) 25-100 MG per tablet immediate release 2.5 tablet   2.5 tablet Oral TID WC Rod Can, MD   2.5 tablet at 07/16/19 0815  . Chlorhexidine Gluconate Cloth 2 % PADS 6 each  6 each Topical Daily Kyle, Tyrone A, DO      . cholecalciferol (VITAMIN D3) tablet 5,000 Units  5,000 Units Oral Daily Rod Can, MD   5,000 Units at 07/15/19 1058  . docusate sodium (COLACE) capsule 100 mg  100 mg Oral BID Rod Can, MD   100 mg at 07/15/19 2203  . enoxaparin (LOVENOX) injection 40 mg  40 mg Subcutaneous Q24H Rod Can, MD   40 mg at 07/16/19 0815  . menthol-cetylpyridinium (CEPACOL) lozenge 3 mg  1 lozenge Oral PRN Swinteck, Aaron Edelman, MD       Or  . phenol (CHLORASEPTIC) mouth spray 1 spray  1 spray Mouth/Throat PRN Swinteck, Aaron Edelman, MD      . metoCLOPramide (REGLAN) tablet 5-10 mg  5-10 mg Oral Q8H PRN Swinteck, Aaron Edelman, MD       Or  . metoCLOPramide (REGLAN) injection 5-10 mg  5-10 mg Intravenous Q8H PRN Swinteck, Aaron Edelman, MD      . morphine 2 MG/ML injection 1 mg  1 mg Intravenous Q2H PRN Swinteck, Aaron Edelman, MD      . multivitamin with minerals tablet 1 tablet  1 tablet Oral Daily Kyle, Tyrone A, DO   1 tablet at 07/15/19 1823  . ondansetron (ZOFRAN) tablet 4 mg  4 mg Oral Q6H PRN Swinteck, Aaron Edelman, MD       Or  . ondansetron (ZOFRAN) injection 4 mg  4 mg Intravenous Q6H PRN Swinteck, Aaron Edelman, MD      . polyethylene glycol (MIRALAX / GLYCOLAX) packet 17 g  17 g Oral Daily Rod Can, MD   17 g at 07/14/19 [id number]  . tamsulosin (FLOMAX) capsule 0.4 mg  0.4 mg Oral q1800 Kyle, Tyrone A, DO   0.4 mg at 07/15/19 1729     Discharge Medications: Please see discharge summary for a list of discharge medications.  Relevant Imaging Results:  Relevant Lab Results:   Additional Information SSN: [id number], refer to AVS/ discharge instruction for surgical site care  Sharin Mons, RN

## 2019-07-16 NOTE — Progress Notes (Signed)
Physical Therapy Treatment Patient Details Name: Kaitlyn Ortiz MRN: [id number] DOB: 12/01/1931 Today's Date: 07/16/2019    History of Present Illness 86 yo resident of [facility] memory care admitted after fall with left hip fx s/p anterior hemiarthroplasty. PMHx: Parkinson's, CAD, HTN, glaucoma, bronchiecstasis    PT Comments    Pt more lethargic than prior session needing cues to maintain eyes open and attend to task. Pt with improved tolerance for LLE movement and improved standing ability with use of Stedy this session. SNF remains appropriate. RN present and aware of pericare performed and transfer with stedy.     Follow Up Recommendations  SNF;Supervision/Assistance - 24 hour     Equipment Recommendations  Wheelchair (measurements PT)    Recommendations for Other Services       Precautions / Restrictions Precautions Precautions: Fall Restrictions LLE Weight Bearing: Weight bearing as tolerated    Mobility  Bed Mobility Overal bed mobility: Needs Assistance Bed Mobility: Supine to Sit   Sidelying to sit: Mod assist;HOB elevated       General bed mobility comments: mod assist to transition to right side of bed with HOB 30 degrees, max cues for sequence and increased time  Transfers Overall transfer level: Needs assistance   Transfers: Sit to/from Stand Sit to Stand: Mod assist;+2 physical assistance Stand pivot transfers: Total assist;+2 safety/equipment       General transfer comment: pt able to clear sacrum with +1 assist from elevated bed but could not maintain standing with RW. Transitioned to stedy with mod +2 assist to rise from surface, pivot to [facility] then to recliner. Pt with improved transition to standing with repetitive trials  Ambulation/Gait             General Gait Details: unable   Stairs             Wheelchair Mobility    Modified Rankin (Stroke Patients Only)       Balance Overall balance assessment: Needs  assistance;History of Falls Sitting-balance support: Feet supported;No upper extremity supported Sitting balance-Leahy Scale: Fair Sitting balance - Comments: EOB with minguard for safety   Standing balance support: Bilateral upper extremity supported Standing balance-Leahy Scale: Poor Standing balance comment: physical assist and bil UE support to stand                            Cognition Arousal/Alertness: Awake/alert Behavior During Therapy: Flat affect Overall Cognitive Status: No family/caregiver present to determine baseline cognitive functioning                                 General Comments: pt with history of dementia living in memory care facility. pt able to follow single step commands inconsistently with increased time      Exercises General Exercises - Lower Extremity Long Arc Quad: AAROM;Left;Seated;10 reps Heel Slides: AAROM;Left;Supine;5 reps Hip ABduction/ADduction: AAROM;Left;Supine;5 reps Hip Flexion/Marching: AAROM;Left;Seated;10 reps    General Comments        Pertinent Vitals/Pain Pain Assessment: Faces Faces Pain Scale: Hurts little more Pain Location: left hip with activity Pain Descriptors / Indicators: Aching;Guarding Pain Intervention(s): Limited activity within patient's tolerance;Monitored during session;Repositioned    Home Living                      Prior Function            PT Goals (current goals  can now be found in the care plan section) Progress towards PT goals: Progressing toward goals    Frequency    Min 3X/week      PT Plan Current plan remains appropriate    Co-evaluation              AM-PAC PT "6 Clicks" Mobility   Outcome Measure  Help needed turning from your back to your side while in a flat bed without using bedrails?: A Lot Help needed moving from lying on your back to sitting on the side of a flat bed without using bedrails?: A Lot Help needed moving to and from a  bed to a chair (including a wheelchair)?: Total Help needed standing up from a chair using your arms (e.g., wheelchair or bedside chair)?: A Lot Help needed to walk in hospital room?: Total Help needed climbing 3-5 steps with a railing? : Total 6 Click Score: 9    End of Session Equipment Utilized During Treatment: Gait belt Activity Tolerance: Patient tolerated treatment well Patient left: in chair;with call bell/phone within reach;with chair alarm set;with nursing/sitter in room Nurse Communication: Mobility status;Precautions;Weight bearing status;Need for lift equipment PT Visit Diagnosis: Other abnormalities of gait and mobility (R26.89);Difficulty in walking, not elsewhere classified (R26.2);Muscle weakness (generalized) (M62.81);History of falling (Z91.81)     Time: [id number] PT Time Calculation (min) (ACUTE ONLY): 29 min  Charges:  $Therapeutic Exercise: 8-22 mins $Therapeutic Activity: 8-22 mins                     Duard Spiewak P, PT Acute Rehabilitation Services Pager: [phone] Office: [place] Zamirah Denny 07/16/2019, 12:35 PM

## 2019-07-17 ENCOUNTER — Other Ambulatory Visit: Payer: Self-pay | Admitting: *Deleted

## 2019-07-17 DIAGNOSIS — H4089 Other specified glaucoma: Secondary | ICD-10-CM | POA: Diagnosis not present

## 2019-07-17 DIAGNOSIS — M62562 Muscle wasting and atrophy, not elsewhere classified, left lower leg: Secondary | ICD-10-CM | POA: Diagnosis not present

## 2019-07-17 DIAGNOSIS — Z471 Aftercare following joint replacement surgery: Secondary | ICD-10-CM | POA: Diagnosis not present

## 2019-07-17 DIAGNOSIS — M25552 Pain in left hip: Secondary | ICD-10-CM | POA: Diagnosis not present

## 2019-07-17 DIAGNOSIS — R278 Other lack of coordination: Secondary | ICD-10-CM | POA: Diagnosis not present

## 2019-07-17 DIAGNOSIS — R296 Repeated falls: Secondary | ICD-10-CM | POA: Diagnosis not present

## 2019-07-17 DIAGNOSIS — G20 Parkinson's disease: Secondary | ICD-10-CM | POA: Diagnosis not present

## 2019-07-17 DIAGNOSIS — F0150 Vascular dementia without behavioral disturbance: Secondary | ICD-10-CM | POA: Diagnosis not present

## 2019-07-17 DIAGNOSIS — G903 Multi-system degeneration of the autonomic nervous system: Secondary | ICD-10-CM | POA: Diagnosis not present

## 2019-07-17 DIAGNOSIS — R2689 Other abnormalities of gait and mobility: Secondary | ICD-10-CM | POA: Diagnosis not present

## 2019-07-17 DIAGNOSIS — M25852 Other specified joint disorders, left hip: Secondary | ICD-10-CM | POA: Diagnosis not present

## 2019-07-17 DIAGNOSIS — M6388 Disorders of muscle in diseases classified elsewhere, other site: Secondary | ICD-10-CM | POA: Diagnosis not present

## 2019-07-17 MED ORDER — HYDROCODONE-ACETAMINOPHEN 5-325 MG PO TABS: 1.0000 | ORAL_TABLET | ORAL | 0 refills | Status: DC | PRN

## 2019-07-20 DIAGNOSIS — R278 Other lack of coordination: Secondary | ICD-10-CM | POA: Diagnosis not present

## 2019-07-20 DIAGNOSIS — M25552 Pain in left hip: Secondary | ICD-10-CM | POA: Diagnosis not present

## 2019-07-20 DIAGNOSIS — R2689 Other abnormalities of gait and mobility: Secondary | ICD-10-CM | POA: Diagnosis not present

## 2019-07-20 DIAGNOSIS — M62562 Muscle wasting and atrophy, not elsewhere classified, left lower leg: Secondary | ICD-10-CM | POA: Diagnosis not present

## 2019-07-20 DIAGNOSIS — M6388 Disorders of muscle in diseases classified elsewhere, other site: Secondary | ICD-10-CM | POA: Diagnosis not present

## 2019-07-20 DIAGNOSIS — M25852 Other specified joint disorders, left hip: Secondary | ICD-10-CM | POA: Diagnosis not present

## 2019-07-21 ENCOUNTER — Non-Acute Institutional Stay (SKILLED_NURSING_FACILITY): Payer: Medicare Other | Admitting: Internal Medicine

## 2019-07-21 ENCOUNTER — Encounter: Payer: Self-pay | Admitting: Internal Medicine

## 2019-07-21 DIAGNOSIS — G903 Multi-system degeneration of the autonomic nervous system: Secondary | ICD-10-CM

## 2019-07-21 DIAGNOSIS — I872 Venous insufficiency (chronic) (peripheral): Secondary | ICD-10-CM | POA: Diagnosis not present

## 2019-07-21 DIAGNOSIS — R278 Other lack of coordination: Secondary | ICD-10-CM | POA: Diagnosis not present

## 2019-07-21 DIAGNOSIS — I712 Thoracic aortic aneurysm, without rupture, unspecified: Secondary | ICD-10-CM

## 2019-07-21 DIAGNOSIS — K592 Neurogenic bowel, not elsewhere classified: Secondary | ICD-10-CM

## 2019-07-21 DIAGNOSIS — I252 Old myocardial infarction: Secondary | ICD-10-CM

## 2019-07-21 DIAGNOSIS — K5900 Constipation, unspecified: Secondary | ICD-10-CM | POA: Diagnosis not present

## 2019-07-21 DIAGNOSIS — M25852 Other specified joint disorders, left hip: Secondary | ICD-10-CM | POA: Diagnosis not present

## 2019-07-21 DIAGNOSIS — N9989 Other postprocedural complications and disorders of genitourinary system: Secondary | ICD-10-CM

## 2019-07-21 DIAGNOSIS — G232 Striatonigral degeneration: Secondary | ICD-10-CM | POA: Diagnosis not present

## 2019-07-21 DIAGNOSIS — R2689 Other abnormalities of gait and mobility: Secondary | ICD-10-CM | POA: Diagnosis not present

## 2019-07-21 DIAGNOSIS — J479 Bronchiectasis, uncomplicated: Secondary | ICD-10-CM | POA: Diagnosis not present

## 2019-07-21 DIAGNOSIS — I2510 Atherosclerotic heart disease of native coronary artery without angina pectoris: Secondary | ICD-10-CM

## 2019-07-21 DIAGNOSIS — H409 Unspecified glaucoma: Secondary | ICD-10-CM | POA: Diagnosis not present

## 2019-07-21 DIAGNOSIS — M6388 Disorders of muscle in diseases classified elsewhere, other site: Secondary | ICD-10-CM | POA: Diagnosis not present

## 2019-07-21 DIAGNOSIS — M25552 Pain in left hip: Secondary | ICD-10-CM | POA: Diagnosis not present

## 2019-07-21 DIAGNOSIS — M810 Age-related osteoporosis without current pathological fracture: Secondary | ICD-10-CM | POA: Diagnosis not present

## 2019-07-21 DIAGNOSIS — R338 Other retention of urine: Secondary | ICD-10-CM

## 2019-07-21 DIAGNOSIS — S72002A Fracture of unspecified part of neck of left femur, initial encounter for closed fracture: Secondary | ICD-10-CM

## 2019-07-21 DIAGNOSIS — M62562 Muscle wasting and atrophy, not elsewhere classified, left lower leg: Secondary | ICD-10-CM | POA: Diagnosis not present

## 2019-07-21 NOTE — Progress Notes (Signed)
Patient ID: Kaitlyn Ortiz, female   DOB: July 08, 1931, 86 y.o.   MRN: [id number]   Provider:  Rexene Edison. [name], D.O., C.M.D. Location:  [facility] Room Number: to [place] of Service:  SNF (31)  PCP: Gayland Curry, DO Patient Care Team: Gayland Curry, DO as PCP - General (Geriatric Medicine) Jerline Pain, MD as PCP - Cardiology (Cardiology) Bjorn Loser, MD as Consulting Physician (Urology)  Extended Emergency Contact Information Primary Emergency Contact: [facility] Address: [address]          [place], [place] [address] [name] of [place] Phone: [phone] Work Phone: [phone] Mobile Phone: [phone] Relation: Daughter  Code Status: DNR, MOST Goals of Care: Advanced Directive information Advanced Directives 07/21/2019  Does Patient Have a Medical Advance Directive? Yes  Type of Advance Directive Out of facility DNR (pink MOST or yellow form)  Does patient want to make changes to medical advance directive? No - Patient declined  Copy of [place] in Chart? -  Pre-existing out of facility DNR order (yellow form or pink MOST form) -   Chief Complaint  Patient presents with  . Readmit To SNF    Readmit to memory care     HPI: Patient is a 86 y.o. female with Parkinson's syndrome (MSA suspect) for which she ambulated with a walker prior to this, dementia, CAD, HTN, bronchiectasis, glaucoma seen today for readmission to [facility] SNF s/p fall on 3/13 evening and subsequent hip fracture identified the following morning after she was c/o pain in left hip and unable to walk.  Xray was done here at [facility] and showed the hip fracture so she was sent to the ED.  It was confirmed.  She was set up for surgery on 07/13/19 and underwent left hip hemi-arthroplasty by Dr. Rod Can She was placed on DVT prophylaxis with asa bid for 6 wks and f/u with ortho in 2 wks.   PT, OT was ordered here.  She did have some postop anemia (hgb down to 9.8 from baseline 12.5).  She had negative covid and mrsa swabs. F/u cbc, bmp was recommended in one week.    Her stay was complicated by acute urinary retention.  She was I/O cathed several times in 24 hrs.  Flomax added and renal US ordered 3/17.  It showed unchanged bilateral renal cysts, but nothing to explain her retention.  Her foley was placed and a voiding trial recommended in the next 3-5 days and urology f/u if she fails the trial.  She returned to memory care on 3/18.  Nursing reports today that she has been requiring I/O caths and they are requesting a foley at this time.  Since returning, her po intake has been poor.  She'd eaten just a few bites of lunch when I saw her.  She was actually needing help with her meal some at this point.  Of note, she'd been very drowsy this morning after taking a hydrocodone last night.  She seems to be doing well with just tylenol for pain.  Therapy worked with her briefly to get her to stand only.    Past Medical History:  Diagnosis Date  . Bronchiectasis ([place])   . Cognitive changes    mild  . Dissecting aortic aneurysm (any part), thoracic (HCC)    couple of years ago per daughter.  . Glaucoma   . Heart attack ([place])    mild  .  Hypertension   . Incontinence    Past Surgical History:  Procedure Laterality Date  . CATARACT EXTRACTION  1994  . CATARACT EXTRACTION  2006  . TOTAL HIP ARTHROPLASTY Left 07/13/2019   Procedure: LEFT HEMI-HIP ARTHROPLASTY ANTERIOR APPROACH;  Surgeon: Rod Can, MD;  Location: [facility];  Service: Orthopedics;  Laterality: Left;    Social History   Socioeconomic History  . Marital status: Single    Spouse name: Not on file  . Number of children: Not on file  . Years of education: Not on file  . Highest education level: Not on file  Occupational History  . Occupation: publishing  Tobacco Use  . Smoking status: Never Smoker  . Smokeless  tobacco: Never Used  Substance and Sexual Activity  . Alcohol use: No  . Drug use: No  . Sexual activity: Not Currently  Other Topics Concern  . Not on file  Social History Narrative   Social History      Diet? Lots of fruits and veggies      Do you drink/eat things with caffeine? no      Marital status?                            single        What year were you married? 1960      Do you live in a house, apartment, assisted living, condo, trailer, etc.? apartment      Is it one or more stories? one      How many persons live in your home? one      Do you have any pets in your home? (please list) no      Highest level of education completed? masters      Current or past profession: publishing      Advanced Directives      Do you exercise?           yes                           Type & how often? Walking- daily      Do you have a living will? yes      Do you have a DNR form?                                  If not, do you want to discuss one? no      Do you have signed POA/HPOA for forms? yes      Functional Status      Do you have difficulty bathing or dressing yourself? no      Do you have difficulty preparing food or eating?  no      Do you have difficulty managing your medications? no      Do you have difficulty managing your finances? no      Do you have difficulty affording your medications? no   Social Determinants of Health   Financial Resource Strain:   . Difficulty of Paying Living Expenses:   Food Insecurity:   . Worried About Charity fundraiser in the Last Year:   . Arboriculturist in the Last Year:   Transportation Needs:   . Film/video editor (Medical):   Marland Kitchen Lack of Transportation (Non-Medical):   Physical Activity:   . Days of  Exercise per Week:   . Minutes of Exercise per Session:   Stress:   . Feeling of Stress :   Social Connections:   . Frequency of Communication with Friends and Family:   . Frequency of Social Gatherings with  Friends and Family:   . Attends Religious Services:   . Active Member of Clubs or Organizations:   . Attends Archivist Meetings:   Marland Kitchen Marital Status:     reports that she has never smoked. She has never used smokeless tobacco. She reports that she does not drink alcohol or use drugs.  Functional Status Survey:    Family History  Problem Relation Age of Onset  . Stroke Father     Health Maintenance  Topic Date Due  . TETANUS/TDAP  10/02/2027  . INFLUENZA VACCINE  Completed  . DEXA SCAN  Completed  . PNA vac Low Risk Adult  Completed    Allergies  Allergen Reactions  . Caffeine     Outpatient Encounter Medications as of 07/21/2019  Medication Sig  . albuterol (ACCUNEB) 0.63 MG/3ML nebulizer solution Take 1 ampule by nebulization 2 (two) times daily.  Marland Kitchen amLODipine (NORVASC) 2.5 MG tablet Take 1 tablet by mouth daily.  . Ascorbic Acid (VITAMIN C) 1000 MG tablet Take 1,000 mg by mouth daily.  Marland Kitchen aspirin 81 MG chewable tablet Chew 1 tablet (81 mg total) by mouth 2 (two) times daily at 10 AM and 5 PM. Continue taking once a day after 6 weeks  . bisacodyl (DULCOLAX) 10 MG suppository Place 10 mg rectally as needed for moderate constipation.  . carbidopa-levodopa (SINEMET IR) 25-100 MG tablet Take 2.5 tabs TID with meals  . Cholecalciferol (VITAMIN D3) 5000 units CAPS Take 1 capsule (5,000 Units total) by mouth daily.  Marland Kitchen donepezil (ARICEPT) 5 MG tablet Take 1 tablet (5 mg total) by mouth at bedtime.  . Glucosamine-Chondroit-Vit C-Mn (GLUCOSAMINE 1500 COMPLEX PO) Take 1 tablet by mouth in the morning and at bedtime.   Marland Kitchen HYDROcodone-acetaminophen (NORCO/VICODIN) 5-325 MG tablet Take 1 tablet by mouth every 4 (four) hours as needed for moderate pain.  Marland Kitchen latanoprost (XALATAN) 0.005 % ophthalmic solution 1 drop at bedtime.  . Lutein 20 MG TABS Take 20 mg by mouth at bedtime.  . Multiple Vitamin (MULTIVITAMIN) tablet Take 1 tablet by mouth daily.  . Multiple Vitamins-Minerals  (PRESERVISION AREDS 2+MULTI VIT) CAPS Take by mouth 2 (two) times daily.  . polyethylene glycol (MIRALAX / GLYCOLAX) packet Take 17 g by mouth daily.   . pravastatin (PRAVACHOL) 40 MG tablet TAKE 1 TABLET ONCE DAILY.  Marland Kitchen senna (SENOKOT) 8.6 MG tablet Take 1 tablet by mouth daily.  Marland Kitchen senna-docusate (SENEXON-S) 8.6-50 MG tablet Take 1 tablet by mouth daily as needed.   . tamsulosin (FLOMAX) 0.4 MG CAPS capsule Take 1 capsule (0.4 mg total) by mouth daily.   No facility-administered encounter medications on file as of 07/21/2019.    Review of Systems  Unable to perform ROS: Dementia (she was too confused to provide good history today and drowsy)  see HPI  Vitals:   07/21/19 0913  BP: (!) 159/82  Pulse: 79  Temp: (!) 97.2 F (36.2 C)  SpO2: 95%  Weight: 162 lb 6.4 oz (73.7 kg)  Height: 5\' 8"  ([id number] m)   Body mass index is 24.69 kg/m. Physical Exam Constitutional:      General: She is not in acute distress.    Appearance: She is normal weight. She is not toxic-appearing.  Comments: drowsy  HENT:     Head: Normocephalic and atraumatic.     Right Ear: External ear normal.     Left Ear: External ear normal.     Nose: Nose normal.     Mouth/Throat:     Pharynx: Oropharynx is clear.  Eyes:     Extraocular Movements: Extraocular movements intact.     Pupils: Pupils are equal, round, and reactive to light.     Comments: glasses  Cardiovascular:     Rate and Rhythm: Normal rate and regular rhythm.     Pulses: Normal pulses.     Heart sounds: Normal heart sounds.  Pulmonary:     Effort: Pulmonary effort is normal.     Breath sounds: Normal breath sounds. No wheezing, rhonchi or rales.  Abdominal:     General: Bowel sounds are normal. There is no distension.     Palpations: Abdomen is soft. There is no mass.     Tenderness: There is no abdominal tenderness. There is no guarding or rebound.  Musculoskeletal:     Right lower leg: No edema.     Left lower leg: No edema.      Comments: In manual wheelchair  Skin:    General: Skin is warm and dry.     Comments: Hydrocolloid dressing in place on lateral hip without erythema, warmth, drainage around it--it's been secured with paper tape as resident had been picking at it  Neurological:     Cranial Nerves: No cranial nerve deficit.     Motor: Weakness present.     Gait: Gait abnormal.     Comments: Drowsy and unable to answer questions appropriately today  Psychiatric:     Comments: Flat affect     Labs reviewed: Basic Metabolic Panel: Recent Labs    07/13/19 0302 07/14/19 0436 07/16/19 0419  NA 142 141 139  K 3.6 3.6 3.8  CL 107 107 105  CO2 25 24 25   GLUCOSE 137* 121* 115*  BUN 20 23 15   CREATININE 0.88 0.83 0.71  CALCIUM 8.3* 7.8* 7.9*  MG  --   --  2.2   Liver Function Tests: Recent Labs    02/23/19 0000 07/12/19 1653 07/16/19 0419  AST  --  34 61*  ALT  --  18 25  ALKPHOS  --  51 39  BILITOT  --  0.9 0.8  PROT  --  7.5 5.4*  ALBUMIN 4.1 4.3 2.6*   No results for input(s): LIPASE, AMYLASE in the last 8760 hours. No results for input(s): AMMONIA in the last 8760 hours. CBC: Recent Labs    07/12/19 1653 07/13/19 0302 07/14/19 0436 07/15/19 0233 07/16/19 0419  WBC 10.3   < > 6.6 6.0 5.7  NEUTROABS 9.0*  --  5.4  --   --   HGB 13.6   < > 9.8* 9.9* 9.9*  HCT 42.3   < > 30.0* 30.7* 29.8*  MCV 92.4   < > 92.0 93.0 91.7  PLT 180   < > 135* 150 164   < > = values in this interval not displayed.   Cardiac Enzymes: No results for input(s): CKTOTAL, CKMB, CKMBINDEX, TROPONINI in the last 8760 hours. BNP: Invalid input(s): POCBNP No results found for: HGBA1C Lab Results  Component Value Date   TSH 2.89 01/01/2017   Lab Results  Component Value Date   VITAMINB12 550 01/01/2017   No results found for: FOLATE No results found for: IRON, TIBC, FERRITIN  Imaging  and Procedures obtained prior to SNF admission: DG Chest 1 View  Result Date: 07/12/2019 CLINICAL DATA:  Fall,  unwitnessed, yesterday EXAM: CHEST  1 VIEW COMPARISON:  11/14/2017 chest radiograph. FINDINGS: Stable cardiomediastinal silhouette with normal heart size. No pneumothorax. No pleural effusion. Lungs appear clear, with no acute consolidative airspace disease and no pulmonary edema. IMPRESSION: No active disease. Electronically Signed   By: Ilona Sorrel M.D.   On: 07/12/2019 17:47   DG Knee 1-2 Views Left  Result Date: 07/13/2019 CLINICAL DATA:  Closed fracture EXAM: LEFT KNEE - 1-2 VIEW COMPARISON:  None. FINDINGS: No fracture of the proximal tibia or distal femur. Patella is normal. No joint effusion. Narrowing of the lateral compartment with associated osteophytosis. No joint effusion. IMPRESSION: 1. No acute findings LEFT knee. 2. Osteoarthritis of the lateral compartment. Electronically Signed   By: Suzy Bouchard M.D.   On: 07/13/2019 09:59   CT Head Wo Contrast  Result Date: 07/12/2019 CLINICAL DATA:  86 year old female with headache and neck pain following fall yesterday. Initial encounter. EXAM: CT HEAD WITHOUT CONTRAST CT CERVICAL SPINE WITHOUT CONTRAST TECHNIQUE: Multidetector CT imaging of the head and cervical spine was performed following the standard protocol without intravenous contrast. Multiplanar CT image reconstructions of the cervical spine were also generated. COMPARISON:  11/14/2017 CT FINDINGS: CT HEAD FINDINGS Brain: No evidence of acute infarction, hemorrhage, hydrocephalus, extra-axial collection or mass lesion/mass effect. Atrophy and chronic small-vessel white matter ischemic changes again noted. Vascular: Carotid atherosclerotic calcifications are noted. Skull: Normal. Negative for fracture or focal lesion. Sinuses/Orbits: No acute finding. Other: None. CT CERVICAL SPINE FINDINGS Alignment: Normal. Skull base and vertebrae: No acute fracture. No primary bone lesion or focal pathologic process. Soft tissues and spinal canal: No prevertebral fluid or swelling. No visible canal  hematoma. Disc levels: Mild multilevel degenerative disc disease/spondylosis, greatest at C5-C6 again noted. Mild to moderate multilevel facet arthropathy again identified. Upper chest: No acute abnormality. Other: None IMPRESSION: 1. No evidence of acute intracranial abnormality. Atrophy and chronic small-vessel white matter ischemic changes. 2. No static evidence of acute injury to the cervical spine. Electronically Signed   By: Margarette Canada M.D.   On: 07/12/2019 17:30   CT Cervical Spine Wo Contrast  Result Date: 07/12/2019 CLINICAL DATA:  86 year old female with headache and neck pain following fall yesterday. Initial encounter. EXAM: CT HEAD WITHOUT CONTRAST CT CERVICAL SPINE WITHOUT CONTRAST TECHNIQUE: Multidetector CT imaging of the head and cervical spine was performed following the standard protocol without intravenous contrast. Multiplanar CT image reconstructions of the cervical spine were also generated. COMPARISON:  11/14/2017 CT FINDINGS: CT HEAD FINDINGS Brain: No evidence of acute infarction, hemorrhage, hydrocephalus, extra-axial collection or mass lesion/mass effect. Atrophy and chronic small-vessel white matter ischemic changes again noted. Vascular: Carotid atherosclerotic calcifications are noted. Skull: Normal. Negative for fracture or focal lesion. Sinuses/Orbits: No acute finding. Other: None. CT CERVICAL SPINE FINDINGS Alignment: Normal. Skull base and vertebrae: No acute fracture. No primary bone lesion or focal pathologic process. Soft tissues and spinal canal: No prevertebral fluid or swelling. No visible canal hematoma. Disc levels: Mild multilevel degenerative disc disease/spondylosis, greatest at C5-C6 again noted. Mild to moderate multilevel facet arthropathy again identified. Upper chest: No acute abnormality. Other: None IMPRESSION: 1. No evidence of acute intracranial abnormality. Atrophy and chronic small-vessel white matter ischemic changes. 2. No static evidence of acute  injury to the cervical spine. Electronically Signed   By: Margarette Canada M.D.   On: 07/12/2019 17:30  Pelvis Portable  Result Date: 07/13/2019 CLINICAL DATA:  Postop EXAM: PORTABLE PELVIS 1-2 VIEWS COMPARISON:  07/12/2019 FINDINGS: Interval left hip replacement. No fracture or malalignment. Gas in the soft tissues consistent with recent surgery. IMPRESSION: Interval left hip replacement with expected postsurgical changes Electronically Signed   By: Donavan Foil M.D.   On: 07/13/2019 15:46   DG C-Arm 1-60 Min  Result Date: 07/13/2019 CLINICAL DATA:  Status post left hemiarthroplasty. EXAM: OPERATIVE left HIP (WITH PELVIS IF PERFORMED) 2 VIEWS TECHNIQUE: Fluoroscopic spot image(s) were submitted for interpretation post-operatively. FLUOROSCOPY TIME:  19 seconds. COMPARISON:  None. FINDINGS: Two intraoperative fluoroscopic images of the left hip demonstrate left hemiarthroplasty in grossly good position. IMPRESSION: Status post left hip hemiarthroplasty. Electronically Signed   By: Marijo Conception M.D.   On: 07/13/2019 14:14   DG HIP OPERATIVE UNILAT WITH PELVIS LEFT  Result Date: 07/13/2019 CLINICAL DATA:  Status post left hemiarthroplasty. EXAM: OPERATIVE left HIP (WITH PELVIS IF PERFORMED) 2 VIEWS TECHNIQUE: Fluoroscopic spot image(s) were submitted for interpretation post-operatively. FLUOROSCOPY TIME:  19 seconds. COMPARISON:  None. FINDINGS: Two intraoperative fluoroscopic images of the left hip demonstrate left hemiarthroplasty in grossly good position. IMPRESSION: Status post left hip hemiarthroplasty. Electronically Signed   By: Marijo Conception M.D.   On: 07/13/2019 14:14   DG Hip Unilat W or Wo Pelvis 2-3 Views Left  Result Date: 07/12/2019 CLINICAL DATA:  Unwitnessed fall yesterday EXAM: DG HIP (WITH OR WITHOUT PELVIS) 2-3V LEFT COMPARISON:  11/14/2017 left hip radiographs FINDINGS: Subcapital left femoral neck fracture with 2 cm over riding of the fracture fragments and 2 cm lateral  displacement of the distal fracture fragment. No dislocation of the left femoral head fracture fragment at the left hip joint. No additional fractures. No pelvic diastasis. No focal osseous lesions. IMPRESSION: Displaced subcapital left femoral neck fracture as detailed. Electronically Signed   By: Ilona Sorrel M.D.   On: 07/12/2019 17:49    Assessment/Plan 1. Left displaced femoral neck fracture (HCC) -s/p ORIF by Dr. Lyla Glassing -is receiving PT, OT in memory care -use tylenol first and hydrocodone only if pain truly severe b/c it sedates her for a prolonged time (she was not a pill-taker)  2. Postoperative urinary retention -of note, she was having considerable OAB previously that did not really respond to PTNS or any meds she was given  -place foley today and set up urology f/u -getting flomax since the problem developed postop  3. Neurogenic orthostatic hypotension (HCC) -likely cause of fall with fx; due to Parkinsonism -she is not able to remember to stand slowly and also does not hydrate adequately making matters worse  4. Multiple system atrophy with predominant parkinsonism ([place]) -followed by neurology -cont sinemet therapy per their recommendations--has predominant left sided stiffness  5. Bronchiectasis without complication ([place]) -to be maintained on nebs long-term per her pulmonary specialist--has historically gotten infections if she stops these and she's not able to use MDI properly  6. Constipation due to neurogenic bowel -cont current bowel regimen and monitor carefully with recent opioid administration  7. Venous insufficiency -compression hose and elevation of feet helpful  8. Thoracic aortic aneurysm without rupture ([place]) -has been monitored by vascular at pt request; however, with her recent decline, I do not recommend repeating imaging about this b/c she would not be a good surgical candidate--I need to discuss again with [name] when she is visiting  9.  Coronary artery disease with history of myocardial infarction without history of CABG -  on baby asa, statin, bp control; will also discuss time to benefit of statin when I can with her daughter  52. Senile osteoporosis -had been on prolia; however, likely cannot be covered by insurance in snf and still had hip fx, remains on vitamin D which makes sense as long as she's able to take meds easily, but if it's a struggle or QOL notably poor in resident or daughter's opinion, we may d/c -I'd like to see how she does over the next few weeks  11. Glaucoma, unspecified glaucoma type, unspecified laterality -cont monitoring of pressure and drops per ophtho  Family/ staff Communication: discussed with memory care snf nurse  Labs/tests ordered:  Has f/u cbc, bmp per d/c summary  Evelina Lore L. Roberta Kelly, D.O. [facility] [address], [place] [address] Cell Phone (Mon-Fri 8am-5pm):  [phone] On Call:  [phone] & follow prompts after 5pm & weekends Office Phone:  [phone] Office Fax:  [phone]

## 2019-07-22 DIAGNOSIS — M25552 Pain in left hip: Secondary | ICD-10-CM | POA: Diagnosis not present

## 2019-07-22 DIAGNOSIS — R278 Other lack of coordination: Secondary | ICD-10-CM | POA: Diagnosis not present

## 2019-07-22 DIAGNOSIS — M6388 Disorders of muscle in diseases classified elsewhere, other site: Secondary | ICD-10-CM | POA: Diagnosis not present

## 2019-07-22 DIAGNOSIS — M62562 Muscle wasting and atrophy, not elsewhere classified, left lower leg: Secondary | ICD-10-CM | POA: Diagnosis not present

## 2019-07-22 DIAGNOSIS — M25852 Other specified joint disorders, left hip: Secondary | ICD-10-CM | POA: Diagnosis not present

## 2019-07-22 DIAGNOSIS — R2689 Other abnormalities of gait and mobility: Secondary | ICD-10-CM | POA: Diagnosis not present

## 2019-07-23 DIAGNOSIS — M62562 Muscle wasting and atrophy, not elsewhere classified, left lower leg: Secondary | ICD-10-CM | POA: Diagnosis not present

## 2019-07-23 DIAGNOSIS — R2689 Other abnormalities of gait and mobility: Secondary | ICD-10-CM | POA: Diagnosis not present

## 2019-07-23 DIAGNOSIS — M25552 Pain in left hip: Secondary | ICD-10-CM | POA: Diagnosis not present

## 2019-07-23 DIAGNOSIS — M25852 Other specified joint disorders, left hip: Secondary | ICD-10-CM | POA: Diagnosis not present

## 2019-07-23 DIAGNOSIS — R7989 Other specified abnormal findings of blood chemistry: Secondary | ICD-10-CM | POA: Diagnosis not present

## 2019-07-23 DIAGNOSIS — R278 Other lack of coordination: Secondary | ICD-10-CM | POA: Diagnosis not present

## 2019-07-23 DIAGNOSIS — M6388 Disorders of muscle in diseases classified elsewhere, other site: Secondary | ICD-10-CM | POA: Diagnosis not present

## 2019-07-23 DIAGNOSIS — D649 Anemia, unspecified: Secondary | ICD-10-CM | POA: Diagnosis not present

## 2019-07-23 LAB — CBC AND DIFFERENTIAL
HCT: 32 — AB (ref 36–46)
Hemoglobin: 10.8 — AB (ref 12.0–16.0)
Platelets: 309 (ref 150–399)
WBC: 7

## 2019-07-23 LAB — BASIC METABOLIC PANEL
BUN: 16 (ref 4–21)
CO2: 24 — AB (ref 13–22)
Chloride: 102 (ref 99–108)
Creatinine: 0.6 (ref 0.5–1.1)
Glucose: 100
Potassium: 4 (ref 3.4–5.3)
Sodium: 140 (ref 137–147)

## 2019-07-23 LAB — COMPREHENSIVE METABOLIC PANEL: Calcium: 8.4 — AB (ref 8.7–10.7)

## 2019-07-23 LAB — CBC: RBC: 3.6 — AB (ref 3.87–5.11)

## 2019-07-24 DIAGNOSIS — R3914 Feeling of incomplete bladder emptying: Secondary | ICD-10-CM | POA: Diagnosis not present

## 2019-07-24 DIAGNOSIS — Z7401 Bed confinement status: Secondary | ICD-10-CM | POA: Diagnosis not present

## 2019-07-24 DIAGNOSIS — M25552 Pain in left hip: Secondary | ICD-10-CM | POA: Diagnosis not present

## 2019-07-24 DIAGNOSIS — M62562 Muscle wasting and atrophy, not elsewhere classified, left lower leg: Secondary | ICD-10-CM | POA: Diagnosis not present

## 2019-07-24 DIAGNOSIS — R2689 Other abnormalities of gait and mobility: Secondary | ICD-10-CM | POA: Diagnosis not present

## 2019-07-24 DIAGNOSIS — R531 Weakness: Secondary | ICD-10-CM | POA: Diagnosis not present

## 2019-07-24 DIAGNOSIS — R5381 Other malaise: Secondary | ICD-10-CM | POA: Diagnosis not present

## 2019-07-24 DIAGNOSIS — R278 Other lack of coordination: Secondary | ICD-10-CM | POA: Diagnosis not present

## 2019-07-24 DIAGNOSIS — M6388 Disorders of muscle in diseases classified elsewhere, other site: Secondary | ICD-10-CM | POA: Diagnosis not present

## 2019-07-24 DIAGNOSIS — N3946 Mixed incontinence: Secondary | ICD-10-CM | POA: Diagnosis not present

## 2019-07-24 DIAGNOSIS — M25852 Other specified joint disorders, left hip: Secondary | ICD-10-CM | POA: Diagnosis not present

## 2019-07-24 DIAGNOSIS — M2550 Pain in unspecified joint: Secondary | ICD-10-CM | POA: Diagnosis not present

## 2019-07-24 DIAGNOSIS — F29 Unspecified psychosis not due to a substance or known physiological condition: Secondary | ICD-10-CM | POA: Diagnosis not present

## 2019-07-27 DIAGNOSIS — M25852 Other specified joint disorders, left hip: Secondary | ICD-10-CM | POA: Diagnosis not present

## 2019-07-27 DIAGNOSIS — R4182 Altered mental status, unspecified: Secondary | ICD-10-CM | POA: Diagnosis not present

## 2019-07-27 DIAGNOSIS — R278 Other lack of coordination: Secondary | ICD-10-CM | POA: Diagnosis not present

## 2019-07-27 DIAGNOSIS — R52 Pain, unspecified: Secondary | ICD-10-CM | POA: Diagnosis not present

## 2019-07-27 DIAGNOSIS — R5381 Other malaise: Secondary | ICD-10-CM | POA: Diagnosis not present

## 2019-07-27 DIAGNOSIS — M6388 Disorders of muscle in diseases classified elsewhere, other site: Secondary | ICD-10-CM | POA: Diagnosis not present

## 2019-07-27 DIAGNOSIS — R279 Unspecified lack of coordination: Secondary | ICD-10-CM | POA: Diagnosis not present

## 2019-07-27 DIAGNOSIS — R531 Weakness: Secondary | ICD-10-CM | POA: Diagnosis not present

## 2019-07-27 DIAGNOSIS — M62562 Muscle wasting and atrophy, not elsewhere classified, left lower leg: Secondary | ICD-10-CM | POA: Diagnosis not present

## 2019-07-27 DIAGNOSIS — M25552 Pain in left hip: Secondary | ICD-10-CM | POA: Diagnosis not present

## 2019-07-27 DIAGNOSIS — R2689 Other abnormalities of gait and mobility: Secondary | ICD-10-CM | POA: Diagnosis not present

## 2019-07-27 DIAGNOSIS — S72032D Displaced midcervical fracture of left femur, subsequent encounter for closed fracture with routine healing: Secondary | ICD-10-CM | POA: Diagnosis not present

## 2019-07-27 DIAGNOSIS — Z743 Need for continuous supervision: Secondary | ICD-10-CM | POA: Diagnosis not present

## 2019-07-28 DIAGNOSIS — M62562 Muscle wasting and atrophy, not elsewhere classified, left lower leg: Secondary | ICD-10-CM | POA: Diagnosis not present

## 2019-07-28 DIAGNOSIS — R278 Other lack of coordination: Secondary | ICD-10-CM | POA: Diagnosis not present

## 2019-07-28 DIAGNOSIS — M25852 Other specified joint disorders, left hip: Secondary | ICD-10-CM | POA: Diagnosis not present

## 2019-07-28 DIAGNOSIS — R2689 Other abnormalities of gait and mobility: Secondary | ICD-10-CM | POA: Diagnosis not present

## 2019-07-28 DIAGNOSIS — M25552 Pain in left hip: Secondary | ICD-10-CM | POA: Diagnosis not present

## 2019-07-28 DIAGNOSIS — M6388 Disorders of muscle in diseases classified elsewhere, other site: Secondary | ICD-10-CM | POA: Diagnosis not present

## 2019-07-29 DIAGNOSIS — R2689 Other abnormalities of gait and mobility: Secondary | ICD-10-CM | POA: Diagnosis not present

## 2019-07-29 DIAGNOSIS — M25852 Other specified joint disorders, left hip: Secondary | ICD-10-CM | POA: Diagnosis not present

## 2019-07-29 DIAGNOSIS — M6388 Disorders of muscle in diseases classified elsewhere, other site: Secondary | ICD-10-CM | POA: Diagnosis not present

## 2019-07-29 DIAGNOSIS — M25552 Pain in left hip: Secondary | ICD-10-CM | POA: Diagnosis not present

## 2019-07-29 DIAGNOSIS — M62562 Muscle wasting and atrophy, not elsewhere classified, left lower leg: Secondary | ICD-10-CM | POA: Diagnosis not present

## 2019-07-29 DIAGNOSIS — R278 Other lack of coordination: Secondary | ICD-10-CM | POA: Diagnosis not present

## 2019-07-30 DIAGNOSIS — R2689 Other abnormalities of gait and mobility: Secondary | ICD-10-CM | POA: Diagnosis not present

## 2019-07-30 DIAGNOSIS — M25852 Other specified joint disorders, left hip: Secondary | ICD-10-CM | POA: Diagnosis not present

## 2019-07-30 DIAGNOSIS — M62562 Muscle wasting and atrophy, not elsewhere classified, left lower leg: Secondary | ICD-10-CM | POA: Diagnosis not present

## 2019-07-30 DIAGNOSIS — F0150 Vascular dementia without behavioral disturbance: Secondary | ICD-10-CM | POA: Diagnosis not present

## 2019-07-30 DIAGNOSIS — G903 Multi-system degeneration of the autonomic nervous system: Secondary | ICD-10-CM | POA: Diagnosis not present

## 2019-07-30 DIAGNOSIS — M6388 Disorders of muscle in diseases classified elsewhere, other site: Secondary | ICD-10-CM | POA: Diagnosis not present

## 2019-07-30 DIAGNOSIS — M25552 Pain in left hip: Secondary | ICD-10-CM | POA: Diagnosis not present

## 2019-07-30 DIAGNOSIS — R296 Repeated falls: Secondary | ICD-10-CM | POA: Diagnosis not present

## 2019-07-30 DIAGNOSIS — R278 Other lack of coordination: Secondary | ICD-10-CM | POA: Diagnosis not present

## 2019-07-30 DIAGNOSIS — Z471 Aftercare following joint replacement surgery: Secondary | ICD-10-CM | POA: Diagnosis not present

## 2019-07-30 DIAGNOSIS — G20 Parkinson's disease: Secondary | ICD-10-CM | POA: Diagnosis not present

## 2019-07-30 DIAGNOSIS — H4089 Other specified glaucoma: Secondary | ICD-10-CM | POA: Diagnosis not present

## 2019-07-31 DIAGNOSIS — M25552 Pain in left hip: Secondary | ICD-10-CM | POA: Diagnosis not present

## 2019-07-31 DIAGNOSIS — M6388 Disorders of muscle in diseases classified elsewhere, other site: Secondary | ICD-10-CM | POA: Diagnosis not present

## 2019-07-31 DIAGNOSIS — R278 Other lack of coordination: Secondary | ICD-10-CM | POA: Diagnosis not present

## 2019-07-31 DIAGNOSIS — R2689 Other abnormalities of gait and mobility: Secondary | ICD-10-CM | POA: Diagnosis not present

## 2019-07-31 DIAGNOSIS — M62562 Muscle wasting and atrophy, not elsewhere classified, left lower leg: Secondary | ICD-10-CM | POA: Diagnosis not present

## 2019-07-31 DIAGNOSIS — M25852 Other specified joint disorders, left hip: Secondary | ICD-10-CM | POA: Diagnosis not present

## 2019-08-03 DIAGNOSIS — M25552 Pain in left hip: Secondary | ICD-10-CM | POA: Diagnosis not present

## 2019-08-03 DIAGNOSIS — M62562 Muscle wasting and atrophy, not elsewhere classified, left lower leg: Secondary | ICD-10-CM | POA: Diagnosis not present

## 2019-08-03 DIAGNOSIS — M25852 Other specified joint disorders, left hip: Secondary | ICD-10-CM | POA: Diagnosis not present

## 2019-08-03 DIAGNOSIS — R2689 Other abnormalities of gait and mobility: Secondary | ICD-10-CM | POA: Diagnosis not present

## 2019-08-03 DIAGNOSIS — M6388 Disorders of muscle in diseases classified elsewhere, other site: Secondary | ICD-10-CM | POA: Diagnosis not present

## 2019-08-03 DIAGNOSIS — R278 Other lack of coordination: Secondary | ICD-10-CM | POA: Diagnosis not present

## 2019-08-04 DIAGNOSIS — M25852 Other specified joint disorders, left hip: Secondary | ICD-10-CM | POA: Diagnosis not present

## 2019-08-04 DIAGNOSIS — M25552 Pain in left hip: Secondary | ICD-10-CM | POA: Diagnosis not present

## 2019-08-04 DIAGNOSIS — M6388 Disorders of muscle in diseases classified elsewhere, other site: Secondary | ICD-10-CM | POA: Diagnosis not present

## 2019-08-04 DIAGNOSIS — M62562 Muscle wasting and atrophy, not elsewhere classified, left lower leg: Secondary | ICD-10-CM | POA: Diagnosis not present

## 2019-08-04 DIAGNOSIS — R2689 Other abnormalities of gait and mobility: Secondary | ICD-10-CM | POA: Diagnosis not present

## 2019-08-04 DIAGNOSIS — R278 Other lack of coordination: Secondary | ICD-10-CM | POA: Diagnosis not present

## 2019-08-05 DIAGNOSIS — M25552 Pain in left hip: Secondary | ICD-10-CM | POA: Diagnosis not present

## 2019-08-05 DIAGNOSIS — M62562 Muscle wasting and atrophy, not elsewhere classified, left lower leg: Secondary | ICD-10-CM | POA: Diagnosis not present

## 2019-08-05 DIAGNOSIS — M6388 Disorders of muscle in diseases classified elsewhere, other site: Secondary | ICD-10-CM | POA: Diagnosis not present

## 2019-08-05 DIAGNOSIS — R2689 Other abnormalities of gait and mobility: Secondary | ICD-10-CM | POA: Diagnosis not present

## 2019-08-05 DIAGNOSIS — M25852 Other specified joint disorders, left hip: Secondary | ICD-10-CM | POA: Diagnosis not present

## 2019-08-05 DIAGNOSIS — R278 Other lack of coordination: Secondary | ICD-10-CM | POA: Diagnosis not present

## 2019-08-06 DIAGNOSIS — R278 Other lack of coordination: Secondary | ICD-10-CM | POA: Diagnosis not present

## 2019-08-06 DIAGNOSIS — M25552 Pain in left hip: Secondary | ICD-10-CM | POA: Diagnosis not present

## 2019-08-06 DIAGNOSIS — M62562 Muscle wasting and atrophy, not elsewhere classified, left lower leg: Secondary | ICD-10-CM | POA: Diagnosis not present

## 2019-08-06 DIAGNOSIS — M6388 Disorders of muscle in diseases classified elsewhere, other site: Secondary | ICD-10-CM | POA: Diagnosis not present

## 2019-08-06 DIAGNOSIS — R2689 Other abnormalities of gait and mobility: Secondary | ICD-10-CM | POA: Diagnosis not present

## 2019-08-06 DIAGNOSIS — M25852 Other specified joint disorders, left hip: Secondary | ICD-10-CM | POA: Diagnosis not present

## 2019-08-07 DIAGNOSIS — M62562 Muscle wasting and atrophy, not elsewhere classified, left lower leg: Secondary | ICD-10-CM | POA: Diagnosis not present

## 2019-08-07 DIAGNOSIS — R2689 Other abnormalities of gait and mobility: Secondary | ICD-10-CM | POA: Diagnosis not present

## 2019-08-07 DIAGNOSIS — M6388 Disorders of muscle in diseases classified elsewhere, other site: Secondary | ICD-10-CM | POA: Diagnosis not present

## 2019-08-07 DIAGNOSIS — M25552 Pain in left hip: Secondary | ICD-10-CM | POA: Diagnosis not present

## 2019-08-07 DIAGNOSIS — M25852 Other specified joint disorders, left hip: Secondary | ICD-10-CM | POA: Diagnosis not present

## 2019-08-07 DIAGNOSIS — R278 Other lack of coordination: Secondary | ICD-10-CM | POA: Diagnosis not present

## 2019-08-10 DIAGNOSIS — R2689 Other abnormalities of gait and mobility: Secondary | ICD-10-CM | POA: Diagnosis not present

## 2019-08-10 DIAGNOSIS — R278 Other lack of coordination: Secondary | ICD-10-CM | POA: Diagnosis not present

## 2019-08-10 DIAGNOSIS — M6388 Disorders of muscle in diseases classified elsewhere, other site: Secondary | ICD-10-CM | POA: Diagnosis not present

## 2019-08-10 DIAGNOSIS — M62562 Muscle wasting and atrophy, not elsewhere classified, left lower leg: Secondary | ICD-10-CM | POA: Diagnosis not present

## 2019-08-10 DIAGNOSIS — M25552 Pain in left hip: Secondary | ICD-10-CM | POA: Diagnosis not present

## 2019-08-10 DIAGNOSIS — M25852 Other specified joint disorders, left hip: Secondary | ICD-10-CM | POA: Diagnosis not present

## 2019-08-11 DIAGNOSIS — R2689 Other abnormalities of gait and mobility: Secondary | ICD-10-CM | POA: Diagnosis not present

## 2019-08-11 DIAGNOSIS — M6388 Disorders of muscle in diseases classified elsewhere, other site: Secondary | ICD-10-CM | POA: Diagnosis not present

## 2019-08-11 DIAGNOSIS — M25552 Pain in left hip: Secondary | ICD-10-CM | POA: Diagnosis not present

## 2019-08-11 DIAGNOSIS — M25852 Other specified joint disorders, left hip: Secondary | ICD-10-CM | POA: Diagnosis not present

## 2019-08-11 DIAGNOSIS — M62562 Muscle wasting and atrophy, not elsewhere classified, left lower leg: Secondary | ICD-10-CM | POA: Diagnosis not present

## 2019-08-11 DIAGNOSIS — R278 Other lack of coordination: Secondary | ICD-10-CM | POA: Diagnosis not present

## 2019-08-12 DIAGNOSIS — M25552 Pain in left hip: Secondary | ICD-10-CM | POA: Diagnosis not present

## 2019-08-12 DIAGNOSIS — Z9189 Other specified personal risk factors, not elsewhere classified: Secondary | ICD-10-CM | POA: Diagnosis not present

## 2019-08-12 DIAGNOSIS — M25852 Other specified joint disorders, left hip: Secondary | ICD-10-CM | POA: Diagnosis not present

## 2019-08-12 DIAGNOSIS — M62562 Muscle wasting and atrophy, not elsewhere classified, left lower leg: Secondary | ICD-10-CM | POA: Diagnosis not present

## 2019-08-12 DIAGNOSIS — M6388 Disorders of muscle in diseases classified elsewhere, other site: Secondary | ICD-10-CM | POA: Diagnosis not present

## 2019-08-12 DIAGNOSIS — R2689 Other abnormalities of gait and mobility: Secondary | ICD-10-CM | POA: Diagnosis not present

## 2019-08-12 DIAGNOSIS — Z20828 Contact with and (suspected) exposure to other viral communicable diseases: Secondary | ICD-10-CM | POA: Diagnosis not present

## 2019-08-12 DIAGNOSIS — R278 Other lack of coordination: Secondary | ICD-10-CM | POA: Diagnosis not present

## 2019-08-13 DIAGNOSIS — R278 Other lack of coordination: Secondary | ICD-10-CM | POA: Diagnosis not present

## 2019-08-13 DIAGNOSIS — R2689 Other abnormalities of gait and mobility: Secondary | ICD-10-CM | POA: Diagnosis not present

## 2019-08-13 DIAGNOSIS — M25552 Pain in left hip: Secondary | ICD-10-CM | POA: Diagnosis not present

## 2019-08-13 DIAGNOSIS — M62562 Muscle wasting and atrophy, not elsewhere classified, left lower leg: Secondary | ICD-10-CM | POA: Diagnosis not present

## 2019-08-13 DIAGNOSIS — M6388 Disorders of muscle in diseases classified elsewhere, other site: Secondary | ICD-10-CM | POA: Diagnosis not present

## 2019-08-13 DIAGNOSIS — M25852 Other specified joint disorders, left hip: Secondary | ICD-10-CM | POA: Diagnosis not present

## 2019-08-14 ENCOUNTER — Non-Acute Institutional Stay (SKILLED_NURSING_FACILITY): Payer: Medicare Other | Admitting: Adult Health

## 2019-08-14 ENCOUNTER — Encounter: Payer: Self-pay | Admitting: Adult Health

## 2019-08-14 DIAGNOSIS — S72002A Fracture of unspecified part of neck of left femur, initial encounter for closed fracture: Secondary | ICD-10-CM

## 2019-08-14 DIAGNOSIS — M62562 Muscle wasting and atrophy, not elsewhere classified, left lower leg: Secondary | ICD-10-CM | POA: Diagnosis not present

## 2019-08-14 DIAGNOSIS — K5909 Other constipation: Secondary | ICD-10-CM

## 2019-08-14 DIAGNOSIS — M25552 Pain in left hip: Secondary | ICD-10-CM | POA: Diagnosis not present

## 2019-08-14 DIAGNOSIS — G232 Striatonigral degeneration: Secondary | ICD-10-CM

## 2019-08-14 DIAGNOSIS — R278 Other lack of coordination: Secondary | ICD-10-CM | POA: Diagnosis not present

## 2019-08-14 DIAGNOSIS — N9989 Other postprocedural complications and disorders of genitourinary system: Secondary | ICD-10-CM

## 2019-08-14 DIAGNOSIS — M25852 Other specified joint disorders, left hip: Secondary | ICD-10-CM | POA: Diagnosis not present

## 2019-08-14 DIAGNOSIS — M6388 Disorders of muscle in diseases classified elsewhere, other site: Secondary | ICD-10-CM | POA: Diagnosis not present

## 2019-08-14 DIAGNOSIS — D62 Acute posthemorrhagic anemia: Secondary | ICD-10-CM | POA: Diagnosis not present

## 2019-08-14 DIAGNOSIS — R2689 Other abnormalities of gait and mobility: Secondary | ICD-10-CM | POA: Diagnosis not present

## 2019-08-14 DIAGNOSIS — R338 Other retention of urine: Secondary | ICD-10-CM | POA: Diagnosis not present

## 2019-08-14 NOTE — Progress Notes (Signed)
Location:  [facility]up[facility] psychologist of Service:  SNF (31) Provider:   Cindi Carbon, ANP [place] [phone]   Gayland Curry, DO  Patient Care Team: Gayland Curry, DO as PCP - General (Geriatric Medicine) Jerline Pain, MD as PCP - Cardiology (Cardiology) Bjorn Loser, MD as Consulting Physician (Urology)  Extended Emergency Contact Information Primary Emergency Contact: [facility] Address: [address]          [place], [place] [address] [name] of [place] Phone: [phone] Work Phone: [phone] Mobile Phone: [phone] Relation: Daughter  Code Status:  DNR Goals of care: Advanced Directive information Advanced Directives 07/21/2019  Does Patient Have a Medical Advance Directive? Yes  Type of Advance Directive Out of facility DNR (pink MOST or yellow form)  Does patient want to make changes to medical advance directive? No - Patient declined  Copy of [place] in Chart? -  Pre-existing out of facility DNR order (yellow form or pink MOST form) -     Chief Complaint  Patient presents with  . Medical Management of Chronic Issues    HPI:  Pt is a 86 y.o. female seen today for medical management of chronic diseases.    S/P Left hemi hip arthroplasty anterior approach: no reports of pain, no prn use of norco Continues to work with PT with little progress. Needs a stand up lift for transfers.   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   MMSE 21/30 07/21/19.  Has declined in the past year and now in the memory care unit. Has periods of anxiety and attempts to get up without help. Needs frequent monitoring per nurse.   Bowels are need prn laxatives per nsg notes.   Acute blood loss anemia: improved to 10.8 from 9.8, no on iron. No reports of excessive bleeding.  Lab Results  Component Value  Date   WBC 7.0 07/23/2019   HGB 10.8 (A) 07/23/2019   HCT 32 (A) 07/23/2019   MCV 91.7 07/16/2019   PLT 309 07/23/2019     Past Medical History:  Diagnosis Date  . Bronchiectasis ([place])   . Cognitive changes    mild  . Dissecting aortic aneurysm (any part), thoracic (HCC)    couple of years ago per daughter.  . Glaucoma   . Heart attack ([place])    mild  . Hypertension   . Incontinence    Past Surgical History:  Procedure Laterality Date  . CATARACT EXTRACTION  1994  . CATARACT EXTRACTION  2006  . TOTAL HIP ARTHROPLASTY Left 07/13/2019   Procedure: LEFT HEMI-HIP ARTHROPLASTY ANTERIOR APPROACH;  Surgeon: Rod Can, MD;  Location: [facility];  Service: Orthopedics;  Laterality: Left;    Allergies  Allergen Reactions  . Caffeine     Outpatient Encounter Medications as of 08/14/2019  Medication Sig  . albuterol (ACCUNEB) 0.63 MG/3ML nebulizer solution Take 1 ampule by nebulization 2 (two) times daily.  Marland Kitchen amLODipine (NORVASC) 2.5 MG tablet Take 1 tablet by mouth daily.  . Ascorbic Acid (VITAMIN C) 1000 MG tablet Take 1,000 mg by mouth daily.  Marland Kitchen aspirin 81 MG chewable tablet Chew 1 tablet (81 mg total) by mouth 2 (two) times daily at 10 AM and 5 PM. Continue taking once a day after 6 weeks  . bisacodyl (DULCOLAX) 10 MG suppository Place 10 mg rectally as needed for  moderate constipation.  . carbidopa-levodopa (SINEMET IR) 25-100 MG tablet Take 2.5 tabs TID with meals  . Cholecalciferol (VITAMIN D3) 5000 units CAPS Take 1 capsule (5,000 Units total) by mouth daily.  Marland Kitchen donepezil (ARICEPT) 5 MG tablet Take 1 tablet (5 mg total) by mouth at bedtime.  . Glucosamine-Chondroit-Vit C-Mn (GLUCOSAMINE 1500 COMPLEX PO) Take 1 tablet by mouth in the morning and at bedtime.   Marland Kitchen HYDROcodone-acetaminophen (NORCO/VICODIN) 5-325 MG tablet Take 1 tablet by mouth every 4 (four) hours as needed for moderate pain.  Marland Kitchen latanoprost (XALATAN) 0.005 % ophthalmic solution 1 drop at bedtime.  . Lutein 20 MG  TABS Take 20 mg by mouth at bedtime.  . Multiple Vitamin (MULTIVITAMIN) tablet Take 1 tablet by mouth daily.  . Multiple Vitamins-Minerals (PRESERVISION AREDS 2+MULTI VIT) CAPS Take by mouth 2 (two) times daily.  . polyethylene glycol (MIRALAX / GLYCOLAX) packet Take 17 g by mouth daily.   . pravastatin (PRAVACHOL) 40 MG tablet TAKE 1 TABLET ONCE DAILY.  Marland Kitchen senna (SENOKOT) 8.6 MG tablet Take 1 tablet by mouth daily.  Marland Kitchen senna-docusate (SENEXON-S) 8.6-50 MG tablet Take 1 tablet by mouth at bedtime.   . [DISCONTINUED] tamsulosin (FLOMAX) 0.4 MG CAPS capsule Take 1 capsule (0.4 mg total) by mouth daily.   No facility-administered encounter medications on file as of 08/14/2019.    Review of Systems  Unable to perform ROS: Dementia    Immunization History  Administered Date(s) Administered  . Influenza, High Dose Seasonal PF 01/29/2016, 02/26/2019  . Influenza,inj,Quad PF,6+ Mos 02/27/2018  . Influenza-Unspecified 02/27/2017  . Pneumococcal Conjugate-13 07/16/2017  . Pneumococcal Polysaccharide-23 04/30/2014  . Tdap 10/01/2017  . Zoster Recombinat (Shingrix) 10/30/2017, 02/10/2018   Pertinent  Health Maintenance Due  Topic Date Due  . INFLUENZA VACCINE  11/29/2019  . DEXA SCAN  Completed  . PNA vac Low Risk Adult  Completed   Fall Risk  04/08/2019 02/25/2019 11/27/2018 11/05/2018 06/25/2018  Falls in the past year? 1 1 0 1 0  Number falls in past yr: 1 1 0 0 0  Comment - - - - -  Injury with Fall? 0 1 0 0 0  Risk Factor Category  - - - - -  Risk for fall due to : Impaired balance/gait - Impaired balance/gait - -  Follow up Falls evaluation completed - Falls evaluation completed - -   Functional Status Survey:    There were no vitals filed for this visit. There is no height or weight on file to calculate BMI. Physical Exam Vitals and nursing note reviewed.  Constitutional:      General: She is not in acute distress.    Appearance: She is not diaphoretic.  HENT:     Head:  Normocephalic and atraumatic.  Neck:     Vascular: No JVD.  Cardiovascular:     Rate and Rhythm: Normal rate and regular rhythm.     Heart sounds: No murmur.  Pulmonary:     Effort: Pulmonary effort is normal. No respiratory distress.     Breath sounds: Normal breath sounds. No wheezing.  Abdominal:     General: Bowel sounds are normal. There is no distension.     Palpations: Abdomen is soft.  Musculoskeletal:        General: No swelling, tenderness, deformity or signs of injury.     Right lower leg: No edema.     Left lower leg: No edema.  Skin:    General: Skin is warm and dry.  Neurological:     General: No focal deficit present.     Mental Status: She is alert.     Comments: Oriented to self only. Not able to f/c. Pushes me away, appears agitated.      Labs reviewed: Recent Labs    07/13/19 0302 07/13/19 0302 07/14/19 0436 07/16/19 0419 07/23/19 0000  NA 142   < > 141 139 140  K 3.6   < > 3.6 3.8 4.0  CL 107   < > 107 105 102  CO2 25   < > 24 25 24*  GLUCOSE 137*  --  121* 115*  --   BUN 20   < > 23 15 16   CREATININE 0.88   < > 0.83 0.71 0.6  CALCIUM 8.3*   < > 7.8* 7.9* 8.4*  MG  --   --   --  2.2  --    < > = values in this interval not displayed.   Recent Labs    02/23/19 0000 07/12/19 1653 07/16/19 0419  AST  --  34 61*  ALT  --  18 25  ALKPHOS  --  51 39  BILITOT  --  0.9 0.8  PROT  --  7.5 5.4*  ALBUMIN 4.1 4.3 2.6*   Recent Labs    07/12/19 1653 07/13/19 0302 07/14/19 0436 07/14/19 0436 07/15/19 0233 07/16/19 0419 07/23/19 0000  WBC 10.3   < > 6.6   < > 6.0 5.7 7.0  NEUTROABS 9.0*  --  5.4  --   --   --   --   HGB 13.6   < > 9.8*   < > 9.9* 9.9* 10.8*  HCT 42.3   < > 30.0*   < > 30.7* 29.8* 32*  MCV 92.4   < > 92.0  --  93.0 91.7  --   PLT 180   < > 135*   < > 150 164 309   < > = values in this interval not displayed.   Lab Results  Component Value Date   TSH 2.89 01/01/2017   No results found for: HGBA1C No results found for:  CHOL, HDL, LDLCALC, LDLDIRECT, TRIG, CHOLHDL  Significant Diagnostic Results in last 30 days:  US RENAL  Result Date: 07/15/2019 CLINICAL DATA:  Urinary retention. EXAM: RENAL / URINARY TRACT ULTRASOUND COMPLETE COMPARISON:  MRI abdomen dated June 05, 2019. FINDINGS: Right Kidney: Renal measurements: 10.6 x 4.7 x 5.1 cm = volume: 132 mL . Echogenicity within normal limits. Unchanged 3.3 cm simple cyst in the upper pole. No mass or hydronephrosis visualized. Left Kidney: Renal measurements: 11.7 x 5.1 x 5.3 cm = volume: 163 mL. Echogenicity within normal limits. Two cysts measuring up to 5.5 cm are unchanged. No mass or hydronephrosis visualized. Bladder: Not visualized. Other: None. IMPRESSION: 1. No acute abnormality. 2. Unchanged bilateral renal cysts. Electronically Signed   By: Titus Dubin M.D.   On: 07/15/2019 15:39    Assessment/Plan 1. Left displaced femoral neck fracture (HCC) S/p ORIF by Dr. Delfino Lovett Continue to work with PT but is making minimal gains.  Pain is controlled without norco  2. Postoperative urinary retention Followed by neurology and continues with a foley catheter. To start back on Flomax prior to next office visit.   3. Other constipation Due to #4 and immobility Change senna s to 1 tab qhs scheduled   4. Multiple system atrophy with predominant parkinsonism ([place]) Followed by neurology, continue sinemet per their recommendations  5. Acute blood loss anemia Lab Results  Component Value Date   HGB 10.8 (A) 07/23/2019  Improved    Family/ staff Communication: nurse Labs/tests ordered:  NA

## 2019-08-17 DIAGNOSIS — M25552 Pain in left hip: Secondary | ICD-10-CM | POA: Diagnosis not present

## 2019-08-17 DIAGNOSIS — M6388 Disorders of muscle in diseases classified elsewhere, other site: Secondary | ICD-10-CM | POA: Diagnosis not present

## 2019-08-17 DIAGNOSIS — R2689 Other abnormalities of gait and mobility: Secondary | ICD-10-CM | POA: Diagnosis not present

## 2019-08-17 DIAGNOSIS — M62562 Muscle wasting and atrophy, not elsewhere classified, left lower leg: Secondary | ICD-10-CM | POA: Diagnosis not present

## 2019-08-17 DIAGNOSIS — R278 Other lack of coordination: Secondary | ICD-10-CM | POA: Diagnosis not present

## 2019-08-17 DIAGNOSIS — M25852 Other specified joint disorders, left hip: Secondary | ICD-10-CM | POA: Diagnosis not present

## 2019-08-18 DIAGNOSIS — M25852 Other specified joint disorders, left hip: Secondary | ICD-10-CM | POA: Diagnosis not present

## 2019-08-18 DIAGNOSIS — M25552 Pain in left hip: Secondary | ICD-10-CM | POA: Diagnosis not present

## 2019-08-18 DIAGNOSIS — M6388 Disorders of muscle in diseases classified elsewhere, other site: Secondary | ICD-10-CM | POA: Diagnosis not present

## 2019-08-18 DIAGNOSIS — M62562 Muscle wasting and atrophy, not elsewhere classified, left lower leg: Secondary | ICD-10-CM | POA: Diagnosis not present

## 2019-08-18 DIAGNOSIS — R2689 Other abnormalities of gait and mobility: Secondary | ICD-10-CM | POA: Diagnosis not present

## 2019-08-18 DIAGNOSIS — R278 Other lack of coordination: Secondary | ICD-10-CM | POA: Diagnosis not present

## 2019-08-19 DIAGNOSIS — M6388 Disorders of muscle in diseases classified elsewhere, other site: Secondary | ICD-10-CM | POA: Diagnosis not present

## 2019-08-19 DIAGNOSIS — R2689 Other abnormalities of gait and mobility: Secondary | ICD-10-CM | POA: Diagnosis not present

## 2019-08-19 DIAGNOSIS — M25852 Other specified joint disorders, left hip: Secondary | ICD-10-CM | POA: Diagnosis not present

## 2019-08-19 DIAGNOSIS — M25552 Pain in left hip: Secondary | ICD-10-CM | POA: Diagnosis not present

## 2019-08-19 DIAGNOSIS — M62562 Muscle wasting and atrophy, not elsewhere classified, left lower leg: Secondary | ICD-10-CM | POA: Diagnosis not present

## 2019-08-19 DIAGNOSIS — R278 Other lack of coordination: Secondary | ICD-10-CM | POA: Diagnosis not present

## 2019-08-20 DIAGNOSIS — M25852 Other specified joint disorders, left hip: Secondary | ICD-10-CM | POA: Diagnosis not present

## 2019-08-20 DIAGNOSIS — M62562 Muscle wasting and atrophy, not elsewhere classified, left lower leg: Secondary | ICD-10-CM | POA: Diagnosis not present

## 2019-08-20 DIAGNOSIS — M25552 Pain in left hip: Secondary | ICD-10-CM | POA: Diagnosis not present

## 2019-08-20 DIAGNOSIS — R2689 Other abnormalities of gait and mobility: Secondary | ICD-10-CM | POA: Diagnosis not present

## 2019-08-20 DIAGNOSIS — M6388 Disorders of muscle in diseases classified elsewhere, other site: Secondary | ICD-10-CM | POA: Diagnosis not present

## 2019-08-20 DIAGNOSIS — R278 Other lack of coordination: Secondary | ICD-10-CM | POA: Diagnosis not present

## 2019-08-21 DIAGNOSIS — M25852 Other specified joint disorders, left hip: Secondary | ICD-10-CM | POA: Diagnosis not present

## 2019-08-21 DIAGNOSIS — R278 Other lack of coordination: Secondary | ICD-10-CM | POA: Diagnosis not present

## 2019-08-21 DIAGNOSIS — M25552 Pain in left hip: Secondary | ICD-10-CM | POA: Diagnosis not present

## 2019-08-21 DIAGNOSIS — M6388 Disorders of muscle in diseases classified elsewhere, other site: Secondary | ICD-10-CM | POA: Diagnosis not present

## 2019-08-21 DIAGNOSIS — M62562 Muscle wasting and atrophy, not elsewhere classified, left lower leg: Secondary | ICD-10-CM | POA: Diagnosis not present

## 2019-08-21 DIAGNOSIS — R2689 Other abnormalities of gait and mobility: Secondary | ICD-10-CM | POA: Diagnosis not present

## 2019-08-24 DIAGNOSIS — M62562 Muscle wasting and atrophy, not elsewhere classified, left lower leg: Secondary | ICD-10-CM | POA: Diagnosis not present

## 2019-08-24 DIAGNOSIS — M6388 Disorders of muscle in diseases classified elsewhere, other site: Secondary | ICD-10-CM | POA: Diagnosis not present

## 2019-08-24 DIAGNOSIS — R278 Other lack of coordination: Secondary | ICD-10-CM | POA: Diagnosis not present

## 2019-08-24 DIAGNOSIS — M25552 Pain in left hip: Secondary | ICD-10-CM | POA: Diagnosis not present

## 2019-08-24 DIAGNOSIS — S72032D Displaced midcervical fracture of left femur, subsequent encounter for closed fracture with routine healing: Secondary | ICD-10-CM | POA: Diagnosis not present

## 2019-08-24 DIAGNOSIS — M25852 Other specified joint disorders, left hip: Secondary | ICD-10-CM | POA: Diagnosis not present

## 2019-08-24 DIAGNOSIS — R2689 Other abnormalities of gait and mobility: Secondary | ICD-10-CM | POA: Diagnosis not present

## 2019-08-25 DIAGNOSIS — M6388 Disorders of muscle in diseases classified elsewhere, other site: Secondary | ICD-10-CM | POA: Diagnosis not present

## 2019-08-25 DIAGNOSIS — R278 Other lack of coordination: Secondary | ICD-10-CM | POA: Diagnosis not present

## 2019-08-25 DIAGNOSIS — R2689 Other abnormalities of gait and mobility: Secondary | ICD-10-CM | POA: Diagnosis not present

## 2019-08-25 DIAGNOSIS — M62562 Muscle wasting and atrophy, not elsewhere classified, left lower leg: Secondary | ICD-10-CM | POA: Diagnosis not present

## 2019-08-25 DIAGNOSIS — M25852 Other specified joint disorders, left hip: Secondary | ICD-10-CM | POA: Diagnosis not present

## 2019-08-25 DIAGNOSIS — M25552 Pain in left hip: Secondary | ICD-10-CM | POA: Diagnosis not present

## 2019-08-26 DIAGNOSIS — R2689 Other abnormalities of gait and mobility: Secondary | ICD-10-CM | POA: Diagnosis not present

## 2019-08-26 DIAGNOSIS — M62562 Muscle wasting and atrophy, not elsewhere classified, left lower leg: Secondary | ICD-10-CM | POA: Diagnosis not present

## 2019-08-26 DIAGNOSIS — M25852 Other specified joint disorders, left hip: Secondary | ICD-10-CM | POA: Diagnosis not present

## 2019-08-26 DIAGNOSIS — M6388 Disorders of muscle in diseases classified elsewhere, other site: Secondary | ICD-10-CM | POA: Diagnosis not present

## 2019-08-26 DIAGNOSIS — R278 Other lack of coordination: Secondary | ICD-10-CM | POA: Diagnosis not present

## 2019-08-26 DIAGNOSIS — M25552 Pain in left hip: Secondary | ICD-10-CM | POA: Diagnosis not present

## 2019-08-27 DIAGNOSIS — M62562 Muscle wasting and atrophy, not elsewhere classified, left lower leg: Secondary | ICD-10-CM | POA: Diagnosis not present

## 2019-08-27 DIAGNOSIS — M25552 Pain in left hip: Secondary | ICD-10-CM | POA: Diagnosis not present

## 2019-08-27 DIAGNOSIS — M25852 Other specified joint disorders, left hip: Secondary | ICD-10-CM | POA: Diagnosis not present

## 2019-08-27 DIAGNOSIS — R2689 Other abnormalities of gait and mobility: Secondary | ICD-10-CM | POA: Diagnosis not present

## 2019-08-27 DIAGNOSIS — M6388 Disorders of muscle in diseases classified elsewhere, other site: Secondary | ICD-10-CM | POA: Diagnosis not present

## 2019-08-27 DIAGNOSIS — R278 Other lack of coordination: Secondary | ICD-10-CM | POA: Diagnosis not present

## 2019-08-28 DIAGNOSIS — M25552 Pain in left hip: Secondary | ICD-10-CM | POA: Diagnosis not present

## 2019-08-28 DIAGNOSIS — M6388 Disorders of muscle in diseases classified elsewhere, other site: Secondary | ICD-10-CM | POA: Diagnosis not present

## 2019-08-28 DIAGNOSIS — M25852 Other specified joint disorders, left hip: Secondary | ICD-10-CM | POA: Diagnosis not present

## 2019-08-28 DIAGNOSIS — R2689 Other abnormalities of gait and mobility: Secondary | ICD-10-CM | POA: Diagnosis not present

## 2019-08-28 DIAGNOSIS — R278 Other lack of coordination: Secondary | ICD-10-CM | POA: Diagnosis not present

## 2019-08-28 DIAGNOSIS — M62562 Muscle wasting and atrophy, not elsewhere classified, left lower leg: Secondary | ICD-10-CM | POA: Diagnosis not present

## 2019-08-31 DIAGNOSIS — H4089 Other specified glaucoma: Secondary | ICD-10-CM | POA: Diagnosis not present

## 2019-08-31 DIAGNOSIS — Z471 Aftercare following joint replacement surgery: Secondary | ICD-10-CM | POA: Diagnosis not present

## 2019-08-31 DIAGNOSIS — M6388 Disorders of muscle in diseases classified elsewhere, other site: Secondary | ICD-10-CM | POA: Diagnosis not present

## 2019-08-31 DIAGNOSIS — G903 Multi-system degeneration of the autonomic nervous system: Secondary | ICD-10-CM | POA: Diagnosis not present

## 2019-08-31 DIAGNOSIS — R2689 Other abnormalities of gait and mobility: Secondary | ICD-10-CM | POA: Diagnosis not present

## 2019-08-31 DIAGNOSIS — M25552 Pain in left hip: Secondary | ICD-10-CM | POA: Diagnosis not present

## 2019-08-31 DIAGNOSIS — F0150 Vascular dementia without behavioral disturbance: Secondary | ICD-10-CM | POA: Diagnosis not present

## 2019-08-31 DIAGNOSIS — M25852 Other specified joint disorders, left hip: Secondary | ICD-10-CM | POA: Diagnosis not present

## 2019-08-31 DIAGNOSIS — G20 Parkinson's disease: Secondary | ICD-10-CM | POA: Diagnosis not present

## 2019-08-31 DIAGNOSIS — R296 Repeated falls: Secondary | ICD-10-CM | POA: Diagnosis not present

## 2019-08-31 DIAGNOSIS — M62562 Muscle wasting and atrophy, not elsewhere classified, left lower leg: Secondary | ICD-10-CM | POA: Diagnosis not present

## 2019-08-31 DIAGNOSIS — R278 Other lack of coordination: Secondary | ICD-10-CM | POA: Diagnosis not present

## 2019-09-01 DIAGNOSIS — M25852 Other specified joint disorders, left hip: Secondary | ICD-10-CM | POA: Diagnosis not present

## 2019-09-01 DIAGNOSIS — M25552 Pain in left hip: Secondary | ICD-10-CM | POA: Diagnosis not present

## 2019-09-01 DIAGNOSIS — R278 Other lack of coordination: Secondary | ICD-10-CM | POA: Diagnosis not present

## 2019-09-01 DIAGNOSIS — R2689 Other abnormalities of gait and mobility: Secondary | ICD-10-CM | POA: Diagnosis not present

## 2019-09-01 DIAGNOSIS — M6388 Disorders of muscle in diseases classified elsewhere, other site: Secondary | ICD-10-CM | POA: Diagnosis not present

## 2019-09-01 DIAGNOSIS — M62562 Muscle wasting and atrophy, not elsewhere classified, left lower leg: Secondary | ICD-10-CM | POA: Diagnosis not present

## 2019-09-02 DIAGNOSIS — M6388 Disorders of muscle in diseases classified elsewhere, other site: Secondary | ICD-10-CM | POA: Diagnosis not present

## 2019-09-02 DIAGNOSIS — R2689 Other abnormalities of gait and mobility: Secondary | ICD-10-CM | POA: Diagnosis not present

## 2019-09-02 DIAGNOSIS — M25552 Pain in left hip: Secondary | ICD-10-CM | POA: Diagnosis not present

## 2019-09-02 DIAGNOSIS — M25852 Other specified joint disorders, left hip: Secondary | ICD-10-CM | POA: Diagnosis not present

## 2019-09-02 DIAGNOSIS — M62562 Muscle wasting and atrophy, not elsewhere classified, left lower leg: Secondary | ICD-10-CM | POA: Diagnosis not present

## 2019-09-02 DIAGNOSIS — R278 Other lack of coordination: Secondary | ICD-10-CM | POA: Diagnosis not present

## 2019-09-03 ENCOUNTER — Non-Acute Institutional Stay (SKILLED_NURSING_FACILITY): Payer: Medicare Other | Admitting: Adult Health

## 2019-09-03 DIAGNOSIS — J479 Bronchiectasis, uncomplicated: Secondary | ICD-10-CM | POA: Diagnosis not present

## 2019-09-03 DIAGNOSIS — K5900 Constipation, unspecified: Secondary | ICD-10-CM

## 2019-09-03 DIAGNOSIS — R338 Other retention of urine: Secondary | ICD-10-CM | POA: Diagnosis not present

## 2019-09-03 DIAGNOSIS — S72002A Fracture of unspecified part of neck of left femur, initial encounter for closed fracture: Secondary | ICD-10-CM | POA: Diagnosis not present

## 2019-09-03 DIAGNOSIS — G232 Striatonigral degeneration: Secondary | ICD-10-CM

## 2019-09-03 DIAGNOSIS — K592 Neurogenic bowel, not elsewhere classified: Secondary | ICD-10-CM

## 2019-09-03 DIAGNOSIS — I10 Essential (primary) hypertension: Secondary | ICD-10-CM | POA: Diagnosis not present

## 2019-09-03 DIAGNOSIS — N9989 Other postprocedural complications and disorders of genitourinary system: Secondary | ICD-10-CM

## 2019-09-03 DIAGNOSIS — M25852 Other specified joint disorders, left hip: Secondary | ICD-10-CM | POA: Diagnosis not present

## 2019-09-03 DIAGNOSIS — M6388 Disorders of muscle in diseases classified elsewhere, other site: Secondary | ICD-10-CM | POA: Diagnosis not present

## 2019-09-03 DIAGNOSIS — M62562 Muscle wasting and atrophy, not elsewhere classified, left lower leg: Secondary | ICD-10-CM | POA: Diagnosis not present

## 2019-09-03 DIAGNOSIS — R278 Other lack of coordination: Secondary | ICD-10-CM | POA: Diagnosis not present

## 2019-09-03 DIAGNOSIS — R2689 Other abnormalities of gait and mobility: Secondary | ICD-10-CM | POA: Diagnosis not present

## 2019-09-03 DIAGNOSIS — M25552 Pain in left hip: Secondary | ICD-10-CM | POA: Diagnosis not present

## 2019-09-04 ENCOUNTER — Encounter: Payer: Self-pay | Admitting: Adult Health

## 2019-09-04 DIAGNOSIS — R2689 Other abnormalities of gait and mobility: Secondary | ICD-10-CM | POA: Diagnosis not present

## 2019-09-04 DIAGNOSIS — R278 Other lack of coordination: Secondary | ICD-10-CM | POA: Diagnosis not present

## 2019-09-04 DIAGNOSIS — M62562 Muscle wasting and atrophy, not elsewhere classified, left lower leg: Secondary | ICD-10-CM | POA: Diagnosis not present

## 2019-09-04 DIAGNOSIS — M25552 Pain in left hip: Secondary | ICD-10-CM | POA: Diagnosis not present

## 2019-09-04 DIAGNOSIS — M6388 Disorders of muscle in diseases classified elsewhere, other site: Secondary | ICD-10-CM | POA: Diagnosis not present

## 2019-09-04 DIAGNOSIS — M25852 Other specified joint disorders, left hip: Secondary | ICD-10-CM | POA: Diagnosis not present

## 2019-09-04 NOTE — Progress Notes (Signed)
Location:  Occup[facility] psychologist of Service:  SNF (31) Provider:   Cindi Carbon, ANP [place] [phone]   Gayland Curry, DO  Patient Care Team: Gayland Curry, DO as PCP - General (Geriatric Medicine) Jerline Pain, MD as PCP - Cardiology (Cardiology) Bjorn Loser, MD as Consulting Physician (Urology)  Extended Emergency Contact Information Primary Emergency Contact: [facility] Address: [address]          [place], [place] [address] [name] of [place] Phone: [phone] Work Phone: [phone] Mobile Phone: [phone] Relation: Daughter  Code Status:  DNR Goals of care: Advanced Directive information Advanced Directives 07/21/2019  Does Patient Have a Medical Advance Directive? Yes  Type of Advance Directive Out of facility DNR (pink MOST or yellow form)  Does patient want to make changes to medical advance directive? No - Patient declined  Copy of [place] in Chart? -  Pre-existing out of facility DNR order (yellow form or pink MOST form) -     Chief Complaint  Patient presents with  . Medical Management of Chronic Issues    HPI:  Pt is a 86 y.o. female seen today for medical management of chronic diseases.    S/P Left hemi hip arthroplasty anterior approach: no reports of pain, no prn use of norco Continues to work with PT with little progress. Needs a stand up lift for transfers.   Having issues with constipation and needs prn meds to have bowel movement.   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 Continues with a foley catheter. No new issues are reported.   Bronchiectasis: no cough or sputum production. Uses albuterol each day which prevents any exacerbation per Dr. Cyndi Lennert note.   BP controlled  Past Medical History:  Diagnosis Date  . Bronchiectasis ([place])   . Cognitive  changes    mild  . Dissecting aortic aneurysm (any part), thoracic (HCC)    couple of years ago per daughter.  . Glaucoma   . Heart attack ([place])    mild  . Hypertension   . Incontinence    Past Surgical History:  Procedure Laterality Date  . CATARACT EXTRACTION  1994  . CATARACT EXTRACTION  2006  . TOTAL HIP ARTHROPLASTY Left 07/13/2019   Procedure: LEFT HEMI-HIP ARTHROPLASTY ANTERIOR APPROACH;  Surgeon: Rod Can, MD;  Location: [facility];  Service: Orthopedics;  Laterality: Left;    Allergies  Allergen Reactions  . Caffeine     Outpatient Encounter Medications as of 09/03/2019  Medication Sig  . albuterol (ACCUNEB) 0.63 MG/3ML nebulizer solution Take 1 ampule by nebulization 2 (two) times daily.  Marland Kitchen amLODipine (NORVASC) 2.5 MG tablet Take 1 tablet by mouth daily.  . Ascorbic Acid (VITAMIN C) 1000 MG tablet Take 1,000 mg by mouth daily.  . bisacodyl (DULCOLAX) 10 MG suppository Place 10 mg rectally as needed for moderate constipation.  . carbidopa-levodopa (SINEMET IR) 25-100 MG tablet Take 2.5 tabs TID with meals  . Cholecalciferol (VITAMIN D3) 5000 units CAPS Take 1 capsule (5,000 Units total) by mouth daily.  Marland Kitchen donepezil (ARICEPT) 5 MG tablet Take 1 tablet (5 mg total) by mouth at bedtime.  . Glucosamine-Chondroit-Vit C-Mn (GLUCOSAMINE 1500 COMPLEX PO) Take 1 tablet by mouth in the morning and at bedtime.   Marland Kitchen HYDROcodone-acetaminophen (NORCO/VICODIN) 5-325 MG tablet Take 1 tablet by mouth every 4 (four) hours  as needed for moderate pain.  Marland Kitchen latanoprost (XALATAN) 0.005 % ophthalmic solution 1 drop at bedtime.  . Lutein 20 MG TABS Take 20 mg by mouth at bedtime.  . Multiple Vitamin (MULTIVITAMIN) tablet Take 1 tablet by mouth daily.  . Multiple Vitamins-Minerals (PRESERVISION AREDS 2+MULTI VIT) CAPS Take by mouth 2 (two) times daily.  . polyethylene glycol (MIRALAX / GLYCOLAX) packet Take 17 g by mouth daily.   . pravastatin (PRAVACHOL) 40 MG tablet TAKE 1 TABLET ONCE DAILY.    Marland Kitchen senna (SENOKOT) 8.6 MG tablet Take 1 tablet by mouth daily.  Marland Kitchen senna-docusate (SENEXON-S) 8.6-50 MG tablet Take 1 tablet by mouth at bedtime.    No facility-administered encounter medications on file as of 09/03/2019.    Review of Systems  Unable to perform ROS: Dementia    Immunization History  Administered Date(s) Administered  . Influenza, High Dose Seasonal PF 01/29/2016, 02/26/2019  . Influenza,inj,Quad PF,6+ Mos 02/27/2018  . Influenza-Unspecified 02/27/2017  . Pneumococcal Conjugate-13 07/16/2017  . Pneumococcal Polysaccharide-23 04/30/2014  . Tdap 10/01/2017  . Zoster Recombinat (Shingrix) 10/30/2017, 02/10/2018   Pertinent  Health Maintenance Due  Topic Date Due  . INFLUENZA VACCINE  11/29/2019  . DEXA SCAN  Completed  . PNA vac Low Risk Adult  Completed   Fall Risk  09/04/2019 04/08/2019 02/25/2019 11/27/2018 11/05/2018  Falls in the past year? 1 1 1  0 1  Number falls in past yr: 1 1 1  0 0  Comment - - - - -  Injury with Fall? 1 0 1 0 0  Risk Factor Category  - - - - -  Risk for fall due to : History of fall(s) Impaired balance/gait - Impaired balance/gait -  Follow up Falls evaluation completed;Follow up appointment Falls evaluation completed - Falls evaluation completed -   Functional Status Survey:    Vitals:   09/04/19 1205  Temp: (!) 96.9 F (36.1 C)  Weight: 159 lb 6.4 oz (72.3 kg)   Body mass index is 24.24 kg/m.  Wt Readings from Last 3 Encounters:  09/04/19 159 lb 6.4 oz (72.3 kg)  07/21/19 162 lb 6.4 oz (73.7 kg)  07/15/19 162 lb 14.7 oz (73.9 kg)    Physical Exam Vitals and nursing note reviewed.  Constitutional:      General: She is not in acute distress.    Appearance: She is not diaphoretic.  HENT:     Head: Normocephalic and atraumatic.  Neck:     Vascular: No JVD.  Cardiovascular:     Rate and Rhythm: Normal rate and regular rhythm.     Heart sounds: No murmur.  Pulmonary:     Effort: Pulmonary effort is normal. No respiratory  distress.     Breath sounds: Normal breath sounds. No wheezing.  Abdominal:     General: Bowel sounds are normal. There is no distension.     Palpations: Abdomen is soft.  Musculoskeletal:        General: No swelling, tenderness, deformity or signs of injury.     Right lower leg: No edema.     Left lower leg: No edema.  Skin:    General: Skin is warm and dry.  Neurological:     General: No focal deficit present.     Mental Status: She is alert.     Comments: Oriented to self only. Currently thinks I am a family member. Left leg restless movement noted.   Psychiatric:     Comments: Slightly anxious  Labs reviewed: Recent Labs    07/13/19 0302 07/13/19 0302 07/14/19 0436 07/16/19 0419 07/23/19 0000  NA 142   < > 141 139 140  K 3.6   < > 3.6 3.8 4.0  CL 107   < > 107 105 102  CO2 25   < > 24 25 24*  GLUCOSE 137*  --  121* 115*  --   BUN 20   < > 23 15 16   CREATININE 0.88   < > 0.83 0.71 0.6  CALCIUM 8.3*   < > 7.8* 7.9* 8.4*  MG  --   --   --  2.2  --    < > = values in this interval not displayed.   Recent Labs    02/23/19 0000 07/12/19 1653 07/16/19 0419  AST  --  34 61*  ALT  --  18 25  ALKPHOS  --  51 39  BILITOT  --  0.9 0.8  PROT  --  7.5 5.4*  ALBUMIN 4.1 4.3 2.6*   Recent Labs    07/12/19 1653 07/13/19 0302 07/14/19 0436 07/14/19 0436 07/15/19 0233 07/16/19 0419 07/23/19 0000  WBC 10.3   < > 6.6   < > 6.0 5.7 7.0  NEUTROABS 9.0*  --  5.4  --   --   --   --   HGB 13.6   < > 9.8*   < > 9.9* 9.9* 10.8*  HCT 42.3   < > 30.0*   < > 30.7* 29.8* 32*  MCV 92.4   < > 92.0  --  93.0 91.7  --   PLT 180   < > 135*   < > 150 164 309   < > = values in this interval not displayed.   Lab Results  Component Value Date   TSH 2.89 01/01/2017   No results found for: HGBA1C No results found for: CHOL, HDL, LDLCALC, LDLDIRECT, TRIG, CHOLHDL  Significant Diagnostic Results in last 30 days:  No results found.  Assessment/Plan 1. Essential  hypertension Controlled, continues on Norvasc 2.5 mg qd   2. Bronchiectasis without complication ([place]) No new issues. Continue albuterol twice daily   3. Constipation due to neurogenic bowel Continue miralax daily. Increase senna s to 2 tabs at bedtime   4. Multiple system atrophy with predominant parkinsonism ([place]) Progressive which led to her fall and memory care stay. Continues on sinemet tid   5. Left displaced femoral neck fracture ([place]) S/p repair. She is WBAT but due to her weakness and fall risk the staff continue to use a lift for transfers. This is not likely to change.   6. Postoperative urinary retention Continue foley catheter and f/u with urology.    Family/ staff Communication: nurse Labs/tests ordered:  NA

## 2019-09-07 DIAGNOSIS — R2689 Other abnormalities of gait and mobility: Secondary | ICD-10-CM | POA: Diagnosis not present

## 2019-09-07 DIAGNOSIS — R278 Other lack of coordination: Secondary | ICD-10-CM | POA: Diagnosis not present

## 2019-09-07 DIAGNOSIS — M6388 Disorders of muscle in diseases classified elsewhere, other site: Secondary | ICD-10-CM | POA: Diagnosis not present

## 2019-09-07 DIAGNOSIS — M25552 Pain in left hip: Secondary | ICD-10-CM | POA: Diagnosis not present

## 2019-09-07 DIAGNOSIS — M62562 Muscle wasting and atrophy, not elsewhere classified, left lower leg: Secondary | ICD-10-CM | POA: Diagnosis not present

## 2019-09-07 DIAGNOSIS — M25852 Other specified joint disorders, left hip: Secondary | ICD-10-CM | POA: Diagnosis not present

## 2019-09-08 DIAGNOSIS — M6388 Disorders of muscle in diseases classified elsewhere, other site: Secondary | ICD-10-CM | POA: Diagnosis not present

## 2019-09-08 DIAGNOSIS — M25852 Other specified joint disorders, left hip: Secondary | ICD-10-CM | POA: Diagnosis not present

## 2019-09-08 DIAGNOSIS — R2689 Other abnormalities of gait and mobility: Secondary | ICD-10-CM | POA: Diagnosis not present

## 2019-09-08 DIAGNOSIS — M62562 Muscle wasting and atrophy, not elsewhere classified, left lower leg: Secondary | ICD-10-CM | POA: Diagnosis not present

## 2019-09-08 DIAGNOSIS — M25552 Pain in left hip: Secondary | ICD-10-CM | POA: Diagnosis not present

## 2019-09-08 DIAGNOSIS — R278 Other lack of coordination: Secondary | ICD-10-CM | POA: Diagnosis not present

## 2019-09-09 DIAGNOSIS — R278 Other lack of coordination: Secondary | ICD-10-CM | POA: Diagnosis not present

## 2019-09-09 DIAGNOSIS — M25552 Pain in left hip: Secondary | ICD-10-CM | POA: Diagnosis not present

## 2019-09-09 DIAGNOSIS — R2689 Other abnormalities of gait and mobility: Secondary | ICD-10-CM | POA: Diagnosis not present

## 2019-09-09 DIAGNOSIS — M6388 Disorders of muscle in diseases classified elsewhere, other site: Secondary | ICD-10-CM | POA: Diagnosis not present

## 2019-09-09 DIAGNOSIS — M25852 Other specified joint disorders, left hip: Secondary | ICD-10-CM | POA: Diagnosis not present

## 2019-09-09 DIAGNOSIS — M62562 Muscle wasting and atrophy, not elsewhere classified, left lower leg: Secondary | ICD-10-CM | POA: Diagnosis not present

## 2019-09-10 DIAGNOSIS — M25852 Other specified joint disorders, left hip: Secondary | ICD-10-CM | POA: Diagnosis not present

## 2019-09-10 DIAGNOSIS — M6388 Disorders of muscle in diseases classified elsewhere, other site: Secondary | ICD-10-CM | POA: Diagnosis not present

## 2019-09-10 DIAGNOSIS — R278 Other lack of coordination: Secondary | ICD-10-CM | POA: Diagnosis not present

## 2019-09-10 DIAGNOSIS — M62562 Muscle wasting and atrophy, not elsewhere classified, left lower leg: Secondary | ICD-10-CM | POA: Diagnosis not present

## 2019-09-10 DIAGNOSIS — R2689 Other abnormalities of gait and mobility: Secondary | ICD-10-CM | POA: Diagnosis not present

## 2019-09-10 DIAGNOSIS — M25552 Pain in left hip: Secondary | ICD-10-CM | POA: Diagnosis not present

## 2019-09-11 DIAGNOSIS — M25552 Pain in left hip: Secondary | ICD-10-CM | POA: Diagnosis not present

## 2019-09-11 DIAGNOSIS — M6388 Disorders of muscle in diseases classified elsewhere, other site: Secondary | ICD-10-CM | POA: Diagnosis not present

## 2019-09-11 DIAGNOSIS — R278 Other lack of coordination: Secondary | ICD-10-CM | POA: Diagnosis not present

## 2019-09-11 DIAGNOSIS — M25852 Other specified joint disorders, left hip: Secondary | ICD-10-CM | POA: Diagnosis not present

## 2019-09-11 DIAGNOSIS — R2689 Other abnormalities of gait and mobility: Secondary | ICD-10-CM | POA: Diagnosis not present

## 2019-09-11 DIAGNOSIS — M62562 Muscle wasting and atrophy, not elsewhere classified, left lower leg: Secondary | ICD-10-CM | POA: Diagnosis not present

## 2019-09-14 DIAGNOSIS — R2689 Other abnormalities of gait and mobility: Secondary | ICD-10-CM | POA: Diagnosis not present

## 2019-09-14 DIAGNOSIS — R278 Other lack of coordination: Secondary | ICD-10-CM | POA: Diagnosis not present

## 2019-09-14 DIAGNOSIS — M25852 Other specified joint disorders, left hip: Secondary | ICD-10-CM | POA: Diagnosis not present

## 2019-09-14 DIAGNOSIS — M6388 Disorders of muscle in diseases classified elsewhere, other site: Secondary | ICD-10-CM | POA: Diagnosis not present

## 2019-09-14 DIAGNOSIS — M62562 Muscle wasting and atrophy, not elsewhere classified, left lower leg: Secondary | ICD-10-CM | POA: Diagnosis not present

## 2019-09-14 DIAGNOSIS — M25552 Pain in left hip: Secondary | ICD-10-CM | POA: Diagnosis not present

## 2019-09-16 DIAGNOSIS — M25852 Other specified joint disorders, left hip: Secondary | ICD-10-CM | POA: Diagnosis not present

## 2019-09-16 DIAGNOSIS — R2689 Other abnormalities of gait and mobility: Secondary | ICD-10-CM | POA: Diagnosis not present

## 2019-09-16 DIAGNOSIS — R278 Other lack of coordination: Secondary | ICD-10-CM | POA: Diagnosis not present

## 2019-09-16 DIAGNOSIS — M25552 Pain in left hip: Secondary | ICD-10-CM | POA: Diagnosis not present

## 2019-09-16 DIAGNOSIS — M62562 Muscle wasting and atrophy, not elsewhere classified, left lower leg: Secondary | ICD-10-CM | POA: Diagnosis not present

## 2019-09-16 DIAGNOSIS — M6388 Disorders of muscle in diseases classified elsewhere, other site: Secondary | ICD-10-CM | POA: Diagnosis not present

## 2019-09-17 DIAGNOSIS — M62562 Muscle wasting and atrophy, not elsewhere classified, left lower leg: Secondary | ICD-10-CM | POA: Diagnosis not present

## 2019-09-17 DIAGNOSIS — M6388 Disorders of muscle in diseases classified elsewhere, other site: Secondary | ICD-10-CM | POA: Diagnosis not present

## 2019-09-17 DIAGNOSIS — M25852 Other specified joint disorders, left hip: Secondary | ICD-10-CM | POA: Diagnosis not present

## 2019-09-17 DIAGNOSIS — R2689 Other abnormalities of gait and mobility: Secondary | ICD-10-CM | POA: Diagnosis not present

## 2019-09-17 DIAGNOSIS — M25552 Pain in left hip: Secondary | ICD-10-CM | POA: Diagnosis not present

## 2019-09-17 DIAGNOSIS — R278 Other lack of coordination: Secondary | ICD-10-CM | POA: Diagnosis not present

## 2019-09-18 DIAGNOSIS — M62562 Muscle wasting and atrophy, not elsewhere classified, left lower leg: Secondary | ICD-10-CM | POA: Diagnosis not present

## 2019-09-18 DIAGNOSIS — M25552 Pain in left hip: Secondary | ICD-10-CM | POA: Diagnosis not present

## 2019-09-18 DIAGNOSIS — R278 Other lack of coordination: Secondary | ICD-10-CM | POA: Diagnosis not present

## 2019-09-18 DIAGNOSIS — M6388 Disorders of muscle in diseases classified elsewhere, other site: Secondary | ICD-10-CM | POA: Diagnosis not present

## 2019-09-18 DIAGNOSIS — R2689 Other abnormalities of gait and mobility: Secondary | ICD-10-CM | POA: Diagnosis not present

## 2019-09-18 DIAGNOSIS — M25852 Other specified joint disorders, left hip: Secondary | ICD-10-CM | POA: Diagnosis not present

## 2019-10-08 ENCOUNTER — Encounter: Payer: Self-pay | Admitting: Adult Health

## 2019-10-08 ENCOUNTER — Other Ambulatory Visit: Payer: Self-pay | Admitting: *Deleted

## 2019-10-08 ENCOUNTER — Non-Acute Institutional Stay (SKILLED_NURSING_FACILITY): Payer: Medicare Other | Admitting: Adult Health

## 2019-10-08 DIAGNOSIS — G232 Striatonigral degeneration: Secondary | ICD-10-CM

## 2019-10-08 DIAGNOSIS — F0151 Vascular dementia with behavioral disturbance: Secondary | ICD-10-CM | POA: Diagnosis not present

## 2019-10-08 DIAGNOSIS — I712 Thoracic aortic aneurysm, without rupture, unspecified: Secondary | ICD-10-CM

## 2019-10-08 DIAGNOSIS — F01518 Vascular dementia, unspecified severity, with other behavioral disturbance: Secondary | ICD-10-CM

## 2019-10-08 MED ORDER — ALPRAZOLAM 0.25 MG PO TABS: 0.2500 mg | ORAL_TABLET | Freq: Two times a day (BID) | ORAL | 0 refills | Status: DC | PRN

## 2019-10-08 NOTE — Progress Notes (Signed)
Location:  Occupational psychologist of Service:  SNF (31) Provider:   Cindi Carbon, ANP [place] [phone]   Gayland Curry, DO  Patient Care Team: Gayland Curry, DO as PCP - General (Geriatric Medicine) Jerline Pain, MD as PCP - Cardiology (Cardiology) Bjorn Loser, MD as Consulting Physician (Urology)  Extended Emergency Contact Information Primary Emergency Contact: [facility] Address: [address]          [place], [place] [address] [name] of [place] Phone: [phone] Work Phone: [phone] Mobile Phone: [phone] Relation: Daughter  Code Status:  DNR Goals of care: Advanced Directive information Advanced Directives 07/21/2019  Does Patient Have a Medical Advance Directive? Yes  Type of Advance Directive Out of facility DNR (pink MOST or yellow form)  Does patient want to make changes to medical advance directive? No - Patient declined  Copy of [place] in Chart? -  Pre-existing out of facility DNR order (yellow form or pink MOST form) -     Chief Complaint  Patient presents with  . Acute Visit    agitation, family request treatment     HPI:  Pt is a 86 y.o. female seen today for an acute visit for agitation. Ms. Luhrs has a diagnosis of vascular dementia as well as multisystem atrophy. She has been progressively more confused and weaker over the passed year. She moved to skilled care recently from memory care due to functional decline. The nurse reports that she is anxious/fidgets, disoriented about her location which causes anxiety, and that she attempts to get out of bed without help. She was found on the floor on 6/8 but luckily did not sustain a major injury. She fell and broke her hip earlier this year for the same reason. Her [facility] has called [facility] and requested medication for this problem.    Past Medical History:  Diagnosis Date  . Bronchiectasis ([place])   .  Cognitive changes    mild  . Dissecting aortic aneurysm (any part), thoracic (HCC)    couple of years ago per daughter.  . Glaucoma   . Heart attack ([place])    mild  . Hypertension   . Incontinence    Past Surgical History:  Procedure Laterality Date  . CATARACT EXTRACTION  1994  . CATARACT EXTRACTION  2006  . TOTAL HIP ARTHROPLASTY Left 07/13/2019   Procedure: LEFT HEMI-HIP ARTHROPLASTY ANTERIOR APPROACH;  Surgeon: Rod Can, MD;  Location: [facility];  Service: Orthopedics;  Laterality: Left;    Allergies  Allergen Reactions  . Caffeine     Outpatient Encounter Medications as of 10/08/2019  Medication Sig  . ALPRAZolam (XANAX) 0.25 MG tablet Take 1 tablet (0.25 mg total) by mouth 2 (two) times daily as needed for up to 14 days for anxiety.  . [DISCONTINUED] ALPRAZolam (XANAX) 0.25 MG tablet Take 0.25 mg by mouth 2 (two) times daily as needed for anxiety.  Marland Kitchen albuterol (ACCUNEB) 0.63 MG/3ML nebulizer solution Take 1 ampule by nebulization 2 (two) times daily.  Marland Kitchen amLODipine (NORVASC) 2.5 MG tablet Take 1 tablet by mouth daily.  . Ascorbic Acid (VITAMIN C) 1000 MG tablet Take 1,000 mg by mouth daily.  . bisacodyl (DULCOLAX) 10 MG suppository Place 10 mg rectally as needed for moderate constipation.  . carbidopa-levodopa (SINEMET IR) 25-100 MG tablet Take 2.5 tabs TID with meals  . Cholecalciferol (VITAMIN D3) 5000 units CAPS Take 1 capsule (5,000 Units total) by mouth daily.  Marland Kitchen donepezil (  ARICEPT) 5 MG tablet Take 1 tablet (5 mg total) by mouth at bedtime.  . Glucosamine-Chondroit-Vit C-Mn (GLUCOSAMINE 1500 COMPLEX PO) Take 1 tablet by mouth in the morning and at bedtime.   Marland Kitchen HYDROcodone-acetaminophen (NORCO/VICODIN) 5-325 MG tablet Take 1 tablet by mouth every 4 (four) hours as needed for moderate pain.  Marland Kitchen latanoprost (XALATAN) 0.005 % ophthalmic solution 1 drop at bedtime.  . Lutein 20 MG TABS Take 20 mg by mouth at bedtime.  . Multiple Vitamin (MULTIVITAMIN) tablet Take 1 tablet  by mouth daily.  . Multiple Vitamins-Minerals (PRESERVISION AREDS 2+MULTI VIT) CAPS Take by mouth 2 (two) times daily.  . polyethylene glycol (MIRALAX / GLYCOLAX) packet Take 17 g by mouth daily.   . pravastatin (PRAVACHOL) 40 MG tablet TAKE 1 TABLET ONCE DAILY.  Marland Kitchen senna (SENOKOT) 8.6 MG tablet Take 1 tablet by mouth daily.  Marland Kitchen senna-docusate (SENEXON-S) 8.6-50 MG tablet Take 2 tablets by mouth at bedtime.    No facility-administered encounter medications on file as of 10/08/2019.    Review of Systems  Unable to perform ROS: Dementia    Immunization History  Administered Date(s) Administered  . Influenza, High Dose Seasonal PF 01/29/2016, 02/26/2019  . Influenza,inj,Quad PF,6+ Mos 02/27/2018  . Influenza-Unspecified 02/27/2017  . Pneumococcal Conjugate-13 07/16/2017  . Pneumococcal Polysaccharide-23 04/30/2014  . Tdap 10/01/2017  . Zoster Recombinat (Shingrix) 10/30/2017, 02/10/2018   Pertinent  Health Maintenance Due  Topic Date Due  . INFLUENZA VACCINE  11/29/2019  . DEXA SCAN  Completed  . PNA vac Low Risk Adult  Completed   Fall Risk  09/04/2019 04/08/2019 02/25/2019 11/27/2018 11/05/2018  Falls in the past year? 1 1 1  0 1  Number falls in past yr: 1 1 1  0 0  Comment - - - - -  Injury with Fall? 1 0 1 0 0  Risk Factor Category  - - - - -  Risk for fall due to : History of fall(s) Impaired balance/gait - Impaired balance/gait -  Follow up Falls evaluation completed;Follow up appointment Falls evaluation completed - Falls evaluation completed -   Functional Status Survey:    Vitals:   10/08/19 1525  Temp: (!) 97 F (36.1 C)   There is no height or weight on file to calculate BMI. Physical Exam Vitals and nursing note reviewed.  Constitutional:      General: She is not in acute distress.    Appearance: She is not diaphoretic.  HENT:     Head: Normocephalic and atraumatic.  Neck:     Vascular: No JVD.  Cardiovascular:     Rate and Rhythm: Normal rate and regular  rhythm.     Heart sounds: No murmur heard.   Pulmonary:     Effort: Pulmonary effort is normal. No respiratory distress.     Breath sounds: Normal breath sounds. No wheezing.  Abdominal:     General: Abdomen is flat. Bowel sounds are normal.     Palpations: Abdomen is soft.  Genitourinary:    Comments: Clear yellow urine in foley Musculoskeletal:     Right lower leg: No edema.     Left lower leg: No edema.  Skin:    General: Skin is warm and dry.  Neurological:     General: No focal deficit present.     Mental Status: She is alert. Mental status is at baseline.     Comments: Only oriented to self.   Psychiatric:     Comments: flat     Labs  reviewed: Recent Labs    07/13/19 0302 07/13/19 0302 07/14/19 0436 07/16/19 0419 07/23/19 0000  NA 142   < > 141 139 140  K 3.6   < > 3.6 3.8 4.0  CL 107   < > 107 105 102  CO2 25   < > 24 25 24*  GLUCOSE 137*  --  121* 115*  --   BUN 20   < > 23 15 16   CREATININE 0.88   < > 0.83 0.71 0.6  CALCIUM 8.3*   < > 7.8* 7.9* 8.4*  MG  --   --   --  2.2  --    < > = values in this interval not displayed.   Recent Labs    02/23/19 0000 07/12/19 1653 07/16/19 0419  AST  --  34 61*  ALT  --  18 25  ALKPHOS  --  51 39  BILITOT  --  0.9 0.8  PROT  --  7.5 5.4*  ALBUMIN 4.1 4.3 2.6*   Recent Labs    07/12/19 1653 07/13/19 0302 07/14/19 0436 07/14/19 0436 07/15/19 0233 07/16/19 0419 07/23/19 0000  WBC 10.3   < > 6.6   < > 6.0 5.7 7.0  NEUTROABS 9.0*  --  5.4  --   --   --   --   HGB 13.6   < > 9.8*   < > 9.9* 9.9* 10.8*  HCT 42.3   < > 30.0*   < > 30.7* 29.8* 32*  MCV 92.4   < > 92.0  --  93.0 91.7  --   PLT 180   < > 135*   < > 150 164 309   < > = values in this interval not displayed.   Lab Results  Component Value Date   TSH 2.89 01/01/2017   No results found for: HGBA1C No results found for: CHOL, HDL, LDLCALC, LDLDIRECT, TRIG, CHOLHDL  Significant Diagnostic Results in last 30 days:  No results  found.  Assessment/Plan 1. Vascular dementia with behavior disturbance ([place]) She is experiencing more feelings of restlessness and anxiety in the afternoon since moving to skilled care. Will try xanax 0.25 mg bid prn x 14 days   2. Multiple system atrophy with predominant parkinsonism ([place]) Progressive, now requiring skilled care. She is due for a f/u with Neurology and I have asked that the nurse discuss this with her daughter due to the difficulty she would have going out for apts. Could certainly do a virtual visit.     Family/ staff Communication: nurse  Labs/tests ordered:  NA

## 2019-10-22 ENCOUNTER — Other Ambulatory Visit: Payer: Self-pay | Admitting: Adult Health

## 2019-10-22 MED ORDER — ALPRAZOLAM 0.25 MG PO TABS: 0.2500 mg | ORAL_TABLET | Freq: Two times a day (BID) | ORAL | 1 refills | Status: AC | PRN

## 2019-11-13 ENCOUNTER — Ambulatory Visit: Payer: Medicare Other | Admitting: Neurology

## 2019-11-17 ENCOUNTER — Encounter: Payer: Medicare Other | Admitting: Thoracic Surgery (Cardiothoracic Vascular Surgery)

## 2019-11-17 ENCOUNTER — Other Ambulatory Visit: Payer: Medicare Other

## 2019-11-24 DIAGNOSIS — G20 Parkinson's disease: Secondary | ICD-10-CM | POA: Diagnosis not present

## 2019-11-24 DIAGNOSIS — G903 Multi-system degeneration of the autonomic nervous system: Secondary | ICD-10-CM | POA: Diagnosis not present

## 2019-11-24 DIAGNOSIS — F0150 Vascular dementia without behavioral disturbance: Secondary | ICD-10-CM | POA: Diagnosis not present

## 2019-11-24 DIAGNOSIS — R296 Repeated falls: Secondary | ICD-10-CM | POA: Diagnosis not present

## 2019-11-25 DIAGNOSIS — F0150 Vascular dementia without behavioral disturbance: Secondary | ICD-10-CM | POA: Diagnosis not present

## 2019-11-25 DIAGNOSIS — G903 Multi-system degeneration of the autonomic nervous system: Secondary | ICD-10-CM | POA: Diagnosis not present

## 2019-11-25 DIAGNOSIS — G20 Parkinson's disease: Secondary | ICD-10-CM | POA: Diagnosis not present

## 2019-11-25 DIAGNOSIS — R296 Repeated falls: Secondary | ICD-10-CM | POA: Diagnosis not present

## 2019-11-26 DIAGNOSIS — G20 Parkinson's disease: Secondary | ICD-10-CM | POA: Diagnosis not present

## 2019-11-26 DIAGNOSIS — G903 Multi-system degeneration of the autonomic nervous system: Secondary | ICD-10-CM | POA: Diagnosis not present

## 2019-11-26 DIAGNOSIS — F0150 Vascular dementia without behavioral disturbance: Secondary | ICD-10-CM | POA: Diagnosis not present

## 2019-11-26 DIAGNOSIS — R296 Repeated falls: Secondary | ICD-10-CM | POA: Diagnosis not present

## 2019-11-27 DIAGNOSIS — R296 Repeated falls: Secondary | ICD-10-CM | POA: Diagnosis not present

## 2019-11-27 DIAGNOSIS — G20 Parkinson's disease: Secondary | ICD-10-CM | POA: Diagnosis not present

## 2019-11-27 DIAGNOSIS — F0150 Vascular dementia without behavioral disturbance: Secondary | ICD-10-CM | POA: Diagnosis not present

## 2019-11-27 DIAGNOSIS — G903 Multi-system degeneration of the autonomic nervous system: Secondary | ICD-10-CM | POA: Diagnosis not present

## 2019-12-01 DIAGNOSIS — R296 Repeated falls: Secondary | ICD-10-CM | POA: Diagnosis not present

## 2019-12-01 DIAGNOSIS — G903 Multi-system degeneration of the autonomic nervous system: Secondary | ICD-10-CM | POA: Diagnosis not present

## 2019-12-01 DIAGNOSIS — G20 Parkinson's disease: Secondary | ICD-10-CM | POA: Diagnosis not present

## 2019-12-01 DIAGNOSIS — F0150 Vascular dementia without behavioral disturbance: Secondary | ICD-10-CM | POA: Diagnosis not present

## 2019-12-02 DIAGNOSIS — F0150 Vascular dementia without behavioral disturbance: Secondary | ICD-10-CM | POA: Diagnosis not present

## 2019-12-02 DIAGNOSIS — G903 Multi-system degeneration of the autonomic nervous system: Secondary | ICD-10-CM | POA: Diagnosis not present

## 2019-12-02 DIAGNOSIS — R296 Repeated falls: Secondary | ICD-10-CM | POA: Diagnosis not present

## 2019-12-02 DIAGNOSIS — G20 Parkinson's disease: Secondary | ICD-10-CM | POA: Diagnosis not present

## 2019-12-07 DIAGNOSIS — G20 Parkinson's disease: Secondary | ICD-10-CM | POA: Diagnosis not present

## 2019-12-07 DIAGNOSIS — G903 Multi-system degeneration of the autonomic nervous system: Secondary | ICD-10-CM | POA: Diagnosis not present

## 2019-12-07 DIAGNOSIS — F0150 Vascular dementia without behavioral disturbance: Secondary | ICD-10-CM | POA: Diagnosis not present

## 2019-12-07 DIAGNOSIS — R296 Repeated falls: Secondary | ICD-10-CM | POA: Diagnosis not present

## 2019-12-08 DIAGNOSIS — G20 Parkinson's disease: Secondary | ICD-10-CM | POA: Diagnosis not present

## 2019-12-08 DIAGNOSIS — R296 Repeated falls: Secondary | ICD-10-CM | POA: Diagnosis not present

## 2019-12-08 DIAGNOSIS — G903 Multi-system degeneration of the autonomic nervous system: Secondary | ICD-10-CM | POA: Diagnosis not present

## 2019-12-08 DIAGNOSIS — F0150 Vascular dementia without behavioral disturbance: Secondary | ICD-10-CM | POA: Diagnosis not present

## 2019-12-09 DIAGNOSIS — G903 Multi-system degeneration of the autonomic nervous system: Secondary | ICD-10-CM | POA: Diagnosis not present

## 2019-12-09 DIAGNOSIS — R296 Repeated falls: Secondary | ICD-10-CM | POA: Diagnosis not present

## 2019-12-09 DIAGNOSIS — F0150 Vascular dementia without behavioral disturbance: Secondary | ICD-10-CM | POA: Diagnosis not present

## 2019-12-09 DIAGNOSIS — G20 Parkinson's disease: Secondary | ICD-10-CM | POA: Diagnosis not present

## 2019-12-10 ENCOUNTER — Encounter: Payer: Self-pay | Admitting: Adult Health

## 2019-12-10 ENCOUNTER — Non-Acute Institutional Stay (SKILLED_NURSING_FACILITY): Payer: Medicare Other | Admitting: Adult Health

## 2019-12-10 DIAGNOSIS — J479 Bronchiectasis, uncomplicated: Secondary | ICD-10-CM | POA: Diagnosis not present

## 2019-12-10 DIAGNOSIS — Z7189 Other specified counseling: Secondary | ICD-10-CM

## 2019-12-10 DIAGNOSIS — I712 Thoracic aortic aneurysm, without rupture, unspecified: Secondary | ICD-10-CM

## 2019-12-10 DIAGNOSIS — R339 Retention of urine, unspecified: Secondary | ICD-10-CM

## 2019-12-10 DIAGNOSIS — R634 Abnormal weight loss: Secondary | ICD-10-CM | POA: Diagnosis not present

## 2019-12-10 DIAGNOSIS — G232 Striatonigral degeneration: Secondary | ICD-10-CM | POA: Diagnosis not present

## 2019-12-10 DIAGNOSIS — R5383 Other fatigue: Secondary | ICD-10-CM

## 2019-12-10 MED ORDER — MORPHINE SULFATE (CONCENTRATE) 20 MG/ML PO SOLN: 5.0000 mg | ORAL | 0 refills | Status: AC | PRN

## 2019-12-10 NOTE — Progress Notes (Signed)
Location:  Occupational psychologist of Service:  SNF (31) Provider:   Cindi Carbon, ANP [place] [phone]   Gayland Curry, DO  Patient Care Team: Gayland Curry, DO as PCP - General (Geriatric Medicine) Jerline Pain, MD as PCP - Cardiology (Cardiology) Bjorn Loser, MD as Consulting Physician (Urology)  Extended Emergency Contact Information Primary Emergency Contact: [facility] Address: [address]          [place], [place] [address] [name] of [place] Phone: [phone] Work Phone: [phone] Mobile Phone: [phone] Relation: Daughter  Code Status:  DNR  Goals of care: Advanced Directive information Advanced Directives 12/10/2019  Does Patient Have a Medical Advance Directive? No;Yes  Type of Paramedic of [place];Living will;Out of facility DNR (pink MOST or yellow form)  Does patient want to make changes to medical advance directive? -  Copy of [place] in Chart? Yes - validated most recent copy scanned in chart (See row information)  Would patient like information on creating a medical advance directive? No - Patient declined  Pre-existing out of facility DNR order (yellow form or pink MOST form) -     Chief Complaint  Patient presents with  . Acute Visit    not eating, lethargic    HPI:  Pt is a 86 y.o. female seen today for an acute visit for not eating and lethargy. Kaitlyn Ortiz has a hx of MSA with parkinsonism. She resides in skilled care and uses a hoyer lift for transfers and is incontinent. Over the past few months she has been more confused, agitated, and eating less. She was using xanax as needed which helped up until 8/7 when it was no longer needed. She has been rapidly losing weight down to 125 lbs from 149 a month ago. Prior to that a gradual weight loss is noted. As of this morning she is minimally responsive and not eating. She is not  having a cough or sputum production. She is not able to swallow pills.    Past Medical History:  Diagnosis Date  . Bronchiectasis ([place])   . Cognitive changes    mild  . Dissecting aortic aneurysm (any part), thoracic (HCC)    couple of years ago per daughter.  . Glaucoma   . Heart attack ([place])    mild  . Hypertension   . Incontinence    Past Surgical History:  Procedure Laterality Date  . CATARACT EXTRACTION  1994  . CATARACT EXTRACTION  2006  . TOTAL HIP ARTHROPLASTY Left 07/13/2019   Procedure: LEFT HEMI-HIP ARTHROPLASTY ANTERIOR APPROACH;  Surgeon: Rod Can, MD;  Location: [facility];  Service: Orthopedics;  Laterality: Left;    Allergies  Allergen Reactions  . Caffeine     Outpatient Encounter Medications as of 12/10/2019  Medication Sig  . ALPRAZolam (XANAX) 0.25 MG tablet Take 0.25 mg by mouth 2 (two) times daily as needed for anxiety.  Marland Kitchen morphine (ROXANOL) 20 MG/ML concentrated solution Take 0.25 mLs (5 mg total) by mouth every 4 (four) hours as needed for severe pain or shortness of breath.  . [DISCONTINUED] morphine (ROXANOL) 20 MG/ML concentrated solution Take 5 mg by mouth every 4 (four) hours as needed for severe pain or shortness of breath.  Marland Kitchen albuterol (ACCUNEB) 0.63 MG/3ML nebulizer solution Take 1 ampule by nebulization every 6 (six) hours as needed.   . bisacodyl (DULCOLAX) 10 MG suppository Place 10 mg rectally as needed for moderate constipation.  . [  DISCONTINUED] amLODipine (NORVASC) 2.5 MG tablet Take 1 tablet by mouth daily.  . [DISCONTINUED] Ascorbic Acid (VITAMIN C) 1000 MG tablet Take 1,000 mg by mouth daily.  . [DISCONTINUED] carbidopa-levodopa (SINEMET IR) 25-100 MG tablet Take 2.5 tabs TID with meals  . [DISCONTINUED] Cholecalciferol (VITAMIN D3) 5000 units CAPS Take 1 capsule (5,000 Units total) by mouth daily.  . [DISCONTINUED] donepezil (ARICEPT) 5 MG tablet Take 1 tablet (5 mg total) by mouth at bedtime.  . [DISCONTINUED]  Glucosamine-Chondroit-Vit C-Mn (GLUCOSAMINE 1500 COMPLEX PO) Take 1 tablet by mouth in the morning and at bedtime.   . [DISCONTINUED] HYDROcodone-acetaminophen (NORCO/VICODIN) 5-325 MG tablet Take 1 tablet by mouth every 4 (four) hours as needed for moderate pain.  . [DISCONTINUED] latanoprost (XALATAN) 0.005 % ophthalmic solution 1 drop at bedtime.  . [DISCONTINUED] Lutein 20 MG TABS Take 20 mg by mouth at bedtime.  . [DISCONTINUED] Multiple Vitamin (MULTIVITAMIN) tablet Take 1 tablet by mouth daily.  . [DISCONTINUED] Multiple Vitamins-Minerals (PRESERVISION AREDS 2+MULTI VIT) CAPS Take by mouth 2 (two) times daily.  . [DISCONTINUED] polyethylene glycol (MIRALAX / GLYCOLAX) packet Take 17 g by mouth daily.   . [DISCONTINUED] pravastatin (PRAVACHOL) 40 MG tablet TAKE 1 TABLET ONCE DAILY.  . [DISCONTINUED] senna (SENOKOT) 8.6 MG tablet Take 1 tablet by mouth daily.  . [DISCONTINUED] senna-docusate (SENEXON-S) 8.6-50 MG tablet Take 2 tablets by mouth at bedtime.    No facility-administered encounter medications on file as of 12/10/2019.    Review of Systems  Unable to perform ROS: Dementia    Immunization History  Administered Date(s) Administered  . Influenza, High Dose Seasonal PF 01/29/2016, 02/26/2019  . Influenza,inj,Quad PF,6+ Mos 02/27/2018  . Influenza-Unspecified 02/27/2017  . Pneumococcal Conjugate-13 07/16/2017  . Pneumococcal Polysaccharide-23 04/30/2014  . Tdap 10/01/2017  . Zoster Recombinat (Shingrix) 10/30/2017, 02/10/2018   Pertinent  Health Maintenance Due  Topic Date Due  . INFLUENZA VACCINE  11/29/2019  . DEXA SCAN  Completed  . PNA vac Low Risk Adult  Completed   Fall Risk  09/04/2019 04/08/2019 02/25/2019 11/27/2018 11/05/2018  Falls in the past year? 1 1 1  0 1  Number falls in past yr: 1 1 1  0 0  Comment - - - - -  Injury with Fall? 1 0 1 0 0  Risk Factor Category  - - - - -  Risk for fall due to : History of fall(s) Impaired balance/gait - Impaired balance/gait  -  Follow up Falls evaluation completed;Follow up appointment Falls evaluation completed - Falls evaluation completed -   Functional Status Survey:    Vitals:   12/10/19 1136  BP: 95/67  Pulse: 77  Resp: 18  Temp: 97.7 F (36.5 C)  SpO2: 90%  Weight: 125 lb (56.7 kg)   Body mass index is 19.01 kg/m. Physical Exam Vitals and nursing note reviewed.  Constitutional:      General: She is not in acute distress.    Appearance: She is not diaphoretic.     Comments: Frail thin appearing   HENT:     Head: Normocephalic and atraumatic.     Mouth/Throat:     Mouth: Mucous membranes are dry.     Pharynx: Oropharynx is clear.  Neck:     Vascular: No JVD.  Cardiovascular:     Rate and Rhythm: Normal rate and regular rhythm.     Heart sounds: No murmur heard.   Pulmonary:     Effort: Pulmonary effort is normal. No respiratory distress.  Breath sounds: No wheezing.     Comments: Decreased right base Abdominal:     General: Bowel sounds are normal. There is no distension.     Palpations: Abdomen is soft.     Tenderness: There is no abdominal tenderness.  Musculoskeletal:        General: No swelling, tenderness, deformity or signs of injury.     Comments: BLE trace edema and scattered bruising.  Decreased ROM to the left knee when compared to the right  Skin:    General: Skin is warm and dry.  Neurological:     Comments: Not able to talk or follow commands. No obvious deficits.  Psychiatric:     Comments: Furrowed brow, eyes slightly open      Labs reviewed: Recent Labs    07/13/19 0302 07/13/19 0302 07/14/19 0436 07/16/19 0419 07/23/19 0000  NA 142   < > 141 139 140  K 3.6   < > 3.6 3.8 4.0  CL 107   < > 107 105 102  CO2 25   < > 24 25 24*  GLUCOSE 137*  --  121* 115*  --   BUN 20   < > 23 15 16   CREATININE 0.88   < > 0.83 0.71 0.6  CALCIUM 8.3*   < > 7.8* 7.9* 8.4*  MG  --   --   --  2.2  --    < > = values in this interval not displayed.   Recent Labs     02/23/19 0000 07/12/19 1653 07/16/19 0419  AST  --  34 61*  ALT  --  18 25  ALKPHOS  --  51 39  BILITOT  --  0.9 0.8  PROT  --  7.5 5.4*  ALBUMIN 4.1 4.3 2.6*   Recent Labs    07/12/19 1653 07/13/19 0302 07/14/19 0436 07/14/19 0436 07/15/19 0233 07/16/19 0419 07/23/19 0000  WBC 10.3   < > 6.6   < > 6.0 5.7 7.0  NEUTROABS 9.0*  --  5.4  --   --   --   --   HGB 13.6   < > 9.8*   < > 9.9* 9.9* 10.8*  HCT 42.3   < > 30.0*   < > 30.7* 29.8* 32*  MCV 92.4   < > 92.0  --  93.0 91.7  --   PLT 180   < > 135*   < > 150 164 309   < > = values in this interval not displayed.   Lab Results  Component Value Date   TSH 2.89 01/01/2017   No results found for: HGBA1C No results found for: CHOL, HDL, LDLCALC, LDLDIRECT, TRIG, CHOLHDL  Significant Diagnostic Results in last 30 days:  No results found.  Assessment/Plan 1. Weight loss Rapid over the past two months due to decreased intake associated with progressive MSA  2. Lethargy Due to dehydration, end of life, and possible pna   3. Multiple system atrophy with predominant parkinsonism ([place]) Severe nearing end of life  Has been declining over the past few months. She has been on sinemet but is now no longer to take this due to lethargy. Will d/c along with all other oral meds.   4. Bronchiectasis without complication (HCC) No current symptoms.  Has decreased lung sound sounds in the RLL. See below goals of care discussion. Will change albuterol q 6 hrs prn sob/wheeze  5. Thoracic aortic aneurysm without rupture (HCC) No longer monitoring  size due to her debility   6. Urinary retention Continue foley cath for retention, staff to provide maintenance.   7. Advanced care planning/counseling discussion I met with Kaitlyn Ortiz, Kaitlyn Ortiz's daughter. We discussed that as of today she is minimally responding and is not eating. She has progressive MSA with parkinsonism which is now severe as she is no longer ambulatory and requires  assistance for all ADLs.  Ms. Viona Gilmore could have a pna given her decreased lung sounds on the right side. After careful discussion with her daughter we opted not to treat this issue given her poor QOL and progressive disease. Her main concern was that her mother be comfortable. She did have a furrowed brow today and moaned. I explained the use of morphine for comfort and Kaitlyn Ortiz agreed that we could use this medication. She will also continue xanax bid prn for anxiety. I offered hospice but at this time her daughter does not feel it is necessary. She will talk to her sister and if she changes her mind she will let us know.   I spent 18 min in counseling and discussion with this patient     Family/ staff Communication: discussed with her daughter Darnell Level ordered:  NA due to goals of care

## 2019-12-11 DIAGNOSIS — F0150 Vascular dementia without behavioral disturbance: Secondary | ICD-10-CM | POA: Diagnosis not present

## 2019-12-11 DIAGNOSIS — G903 Multi-system degeneration of the autonomic nervous system: Secondary | ICD-10-CM | POA: Diagnosis not present

## 2019-12-11 DIAGNOSIS — Z9189 Other specified personal risk factors, not elsewhere classified: Secondary | ICD-10-CM | POA: Diagnosis not present

## 2019-12-11 DIAGNOSIS — G20 Parkinson's disease: Secondary | ICD-10-CM | POA: Diagnosis not present

## 2019-12-11 DIAGNOSIS — R296 Repeated falls: Secondary | ICD-10-CM | POA: Diagnosis not present

## 2019-12-30 DEATH — deceased

## 2022-02-01 IMAGING — MR MR ABDOMEN WO/W CM
11 of 18 series · 20 of 48 positions shown · IV contrast (7 ML GAD)
Comparison: CTA of the abdomen of 05/19/2019.

CLINICAL DATA: Left renal mass.

EXAM:
MRI ABDOMEN WITHOUT AND WITH CONTRAST
TECHNIQUE: Multiplanar multisequence MR imaging of the abdomen was performed
both before and after the administration of intravenous contrast.
CONTRAST:  7mL GADAVIST GADOBUTROL 1 MMOL/ML IV SOLN

[Series 4: cor ssfse nav · coronal · 6.0mm · 0.70mm/px · 1 of 31 slices shown]
[im 1/31]
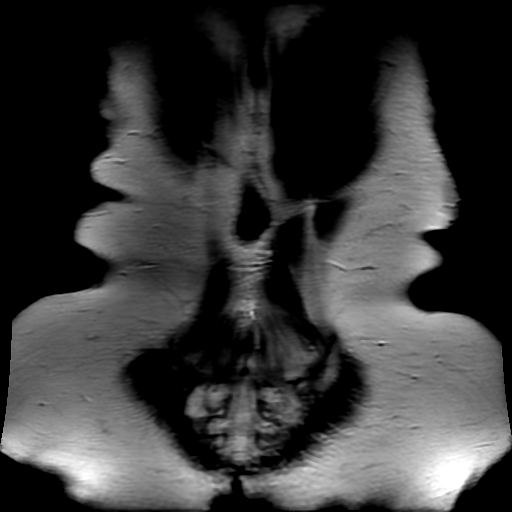

[Series 5: ax ssfse nav · axial · 6.0mm · 0.74mm/px · 1 of 39 slices shown]
[im 1/39]
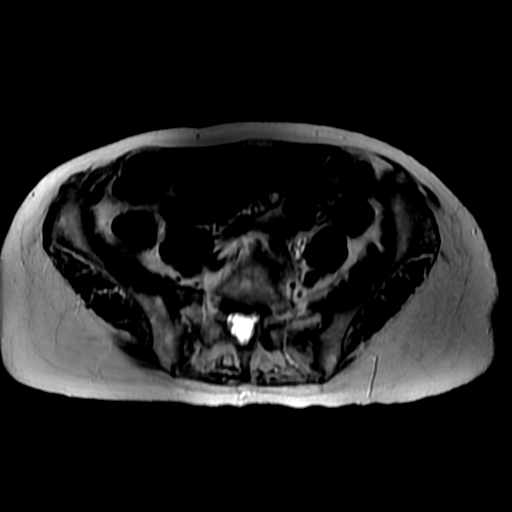

[Series 6: T2 fat-sat · axial · 6.0mm · 0.74mm/px · 1 of 36 slices shown]
[im 1/36]
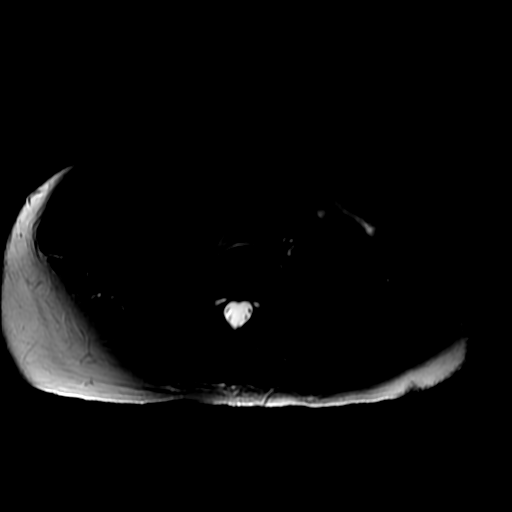

[Series 7: T1 dynamic · axial · 3.8mm · 0.74mm/px · 1 of 64 slices shown (1 of 3)]
[im 1/64]
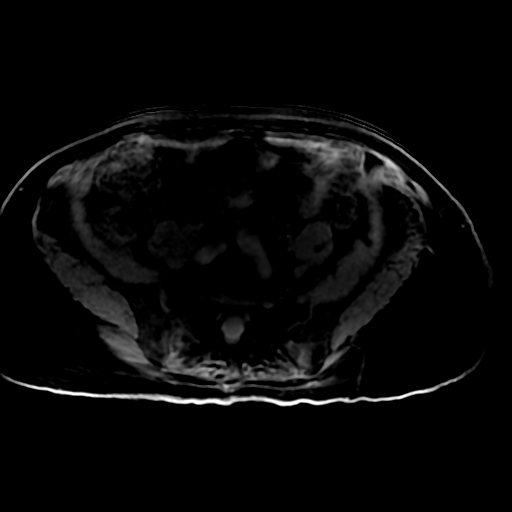

[Series 8: DWI b500 · axial · 8.0mm · 1.48mm/px · 1 of 64 slices shown]
[im 1/64]
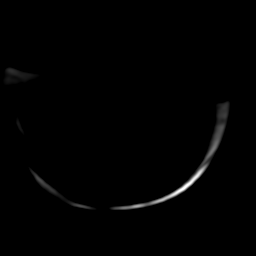

[Series 12: T1 dynamic post-contrast · coronal · 3.8mm · 0.74mm/px · 2 of 56 slices shown (1 of 3)]
[im 1/56]
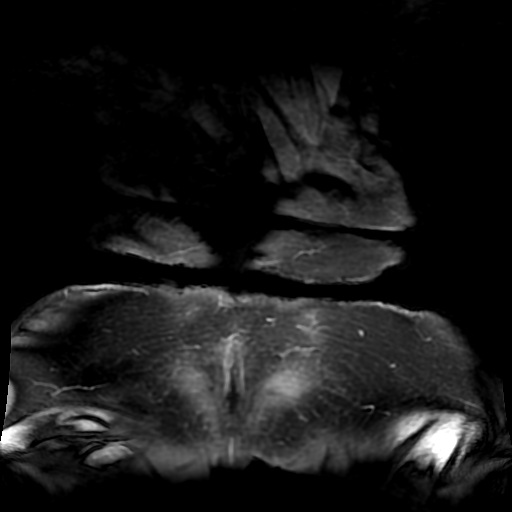
[im 56/56]
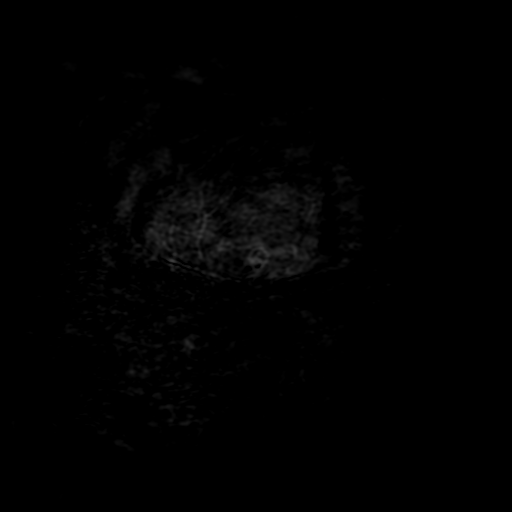

[Series 701: T1 dynamic · axial · 3.8mm · 0.74mm/px · z∈[-37,+202]mm · 2 of 64 slices shown (2 of 3)]
[im 1/64]
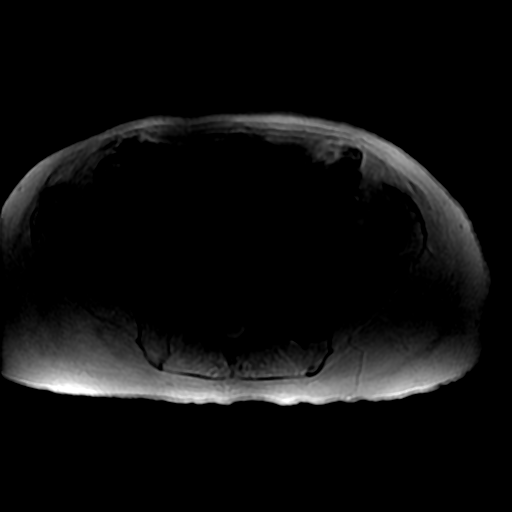
[im 64/64]
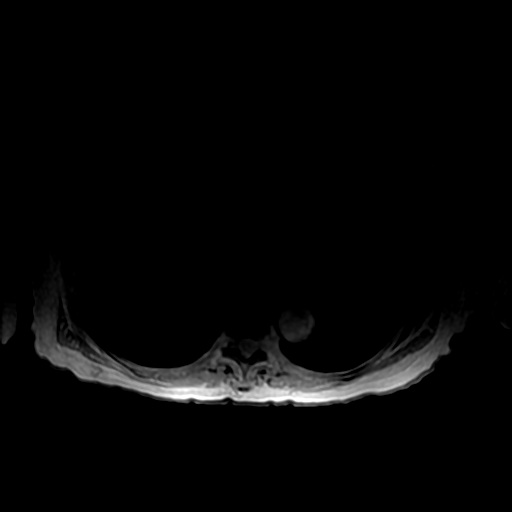

[Series 702: T1 dynamic · axial · 3.8mm · 0.74mm/px · z∈[-37,+202]mm · 2 of 64 slices shown (3 of 3)]
[im 1/64]
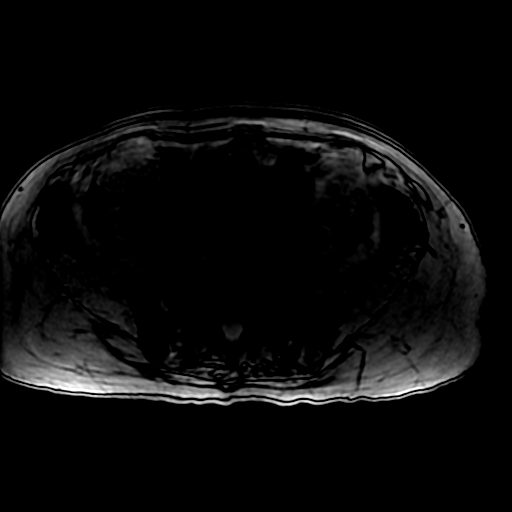
[im 64/64]
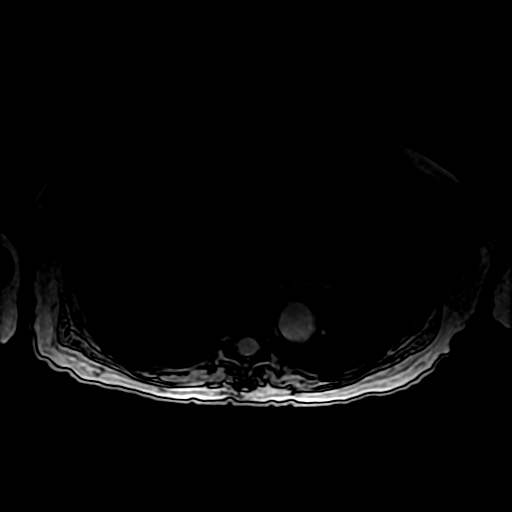

[Series 850: ADC · axial · 8.0mm · 1.48mm/px · 1 of 32 slices shown]
[im 1/32]
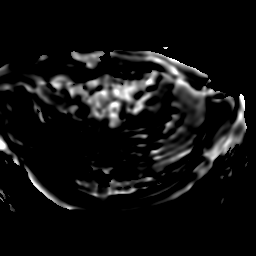

[Series 900: T1 dynamic post-contrast · axial · non-contrast · 4.0mm · 0.74mm/px · z∈[-7,+207]mm · 4 of 108 slices shown (2 of 3)]
[im 1/108]
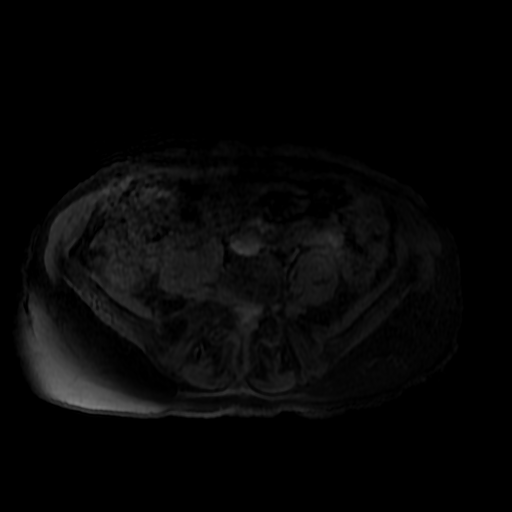
[im 36/108]
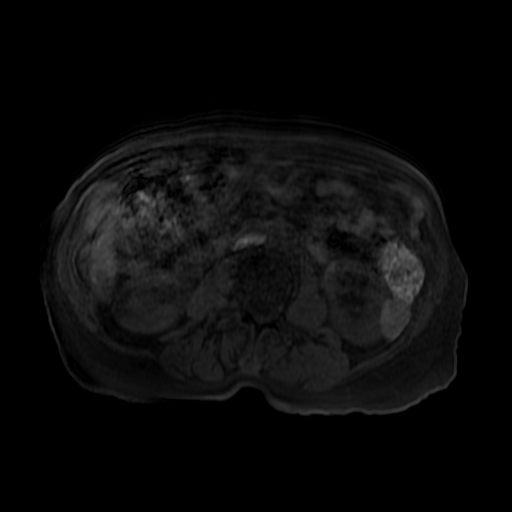
[im 72/108]
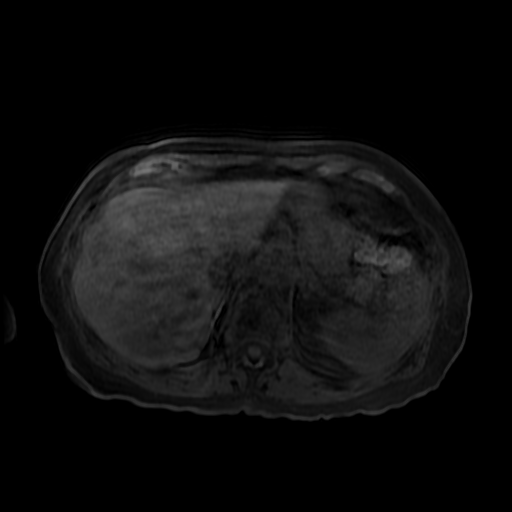
[im 108/108]
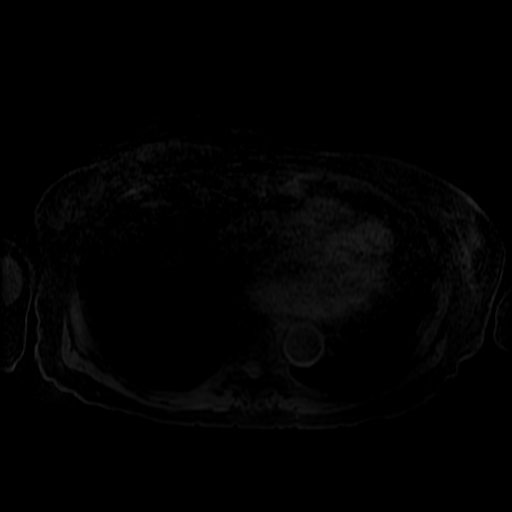

[Series 901: T1 dynamic post-contrast · axial · non-contrast · 4.0mm · 0.74mm/px · z∈[-7,+207]mm · 4 of 108 slices shown (3 of 3)]
[im 1/108]
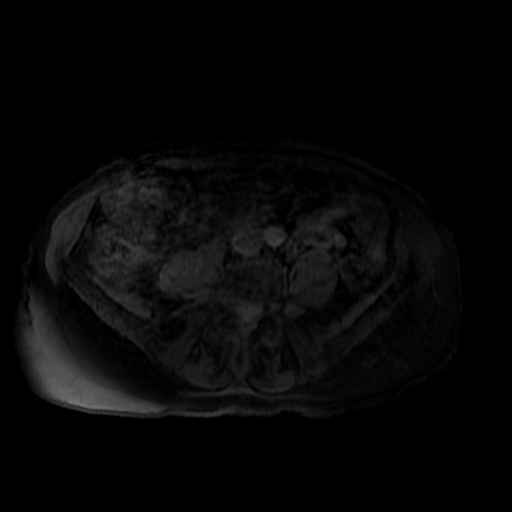
[im 36/108]
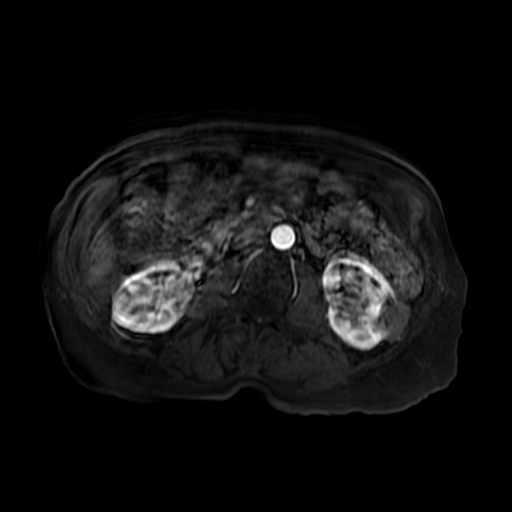
[im 72/108]
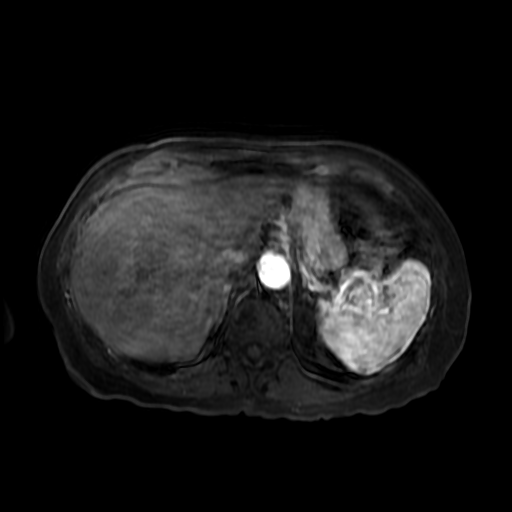
[im 108/108]
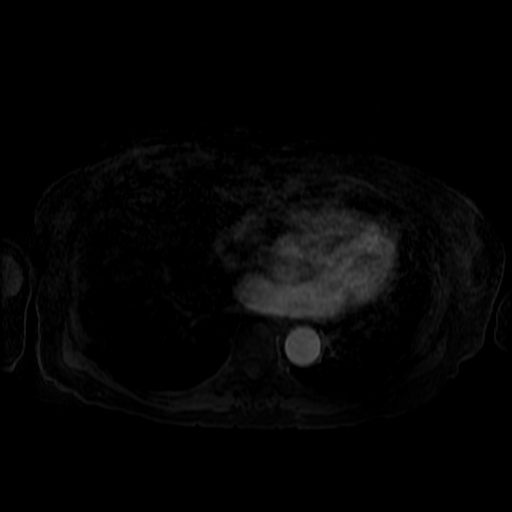

[20 of 48 positions shown; findings below may reference images not displayed]

FINDINGS: Moderate motion degradation throughout. The pre and postcontrast
dynamics are severely motion degraded.

Lower chest: Cardiomegaly, without pericardial or pleural effusion.

Hepatobiliary: Hepatic cysts. Normal gallbladder, without biliary
ductal dilatation.

Pancreas:  Normal, without mass or ductal dilatation.

Spleen:  Normal in size, without focal abnormality.

Adrenals/Urinary Tract: Normal adrenal glands. Bilateral hepatic
cysts, the largest of which is in the upper pole left kidney at
cm.

Corresponding to the CT abnormality, within the lateral
interpolar/lower pole left kidney is a 2.8 x 2.1 x 3.0 cm mildly T2
hypointense lesion on [DATE] and [DATE]. Demonstrates precontrast T1
hyperintensity, including on 39/900. After post-contrast, no
evidence of enhancement, including on subtracted images.

No hydronephrosis.

Stomach/Bowel: Grossly normal stomach and abdominal bowel loops.

Vascular/Lymphatic: Aortic atherosclerosis. No abdominal adenopathy.

Other:  No ascites.

Musculoskeletal: Convex left lumbar spine curvature. Probable
hemangioma within the right-side of the T12 vertebral body.
IMPRESSION: 1. Moderate motion degradation, as detailed above.
2. The left renal lesion is consistent with a
hemorrhagic/proteinaceous cyst.
3.  Aortic Atherosclerosis ([id number]-[id number]).
